# Patient Record
Sex: Female | Born: 1950 | Race: White | Hispanic: No | Marital: Married | Smoking: Former smoker
Health system: Southern US, Community
[De-identification: ages and names within clinical notes are randomized; demographics above are authoritative.]

## PROBLEM LIST (undated history)

## (undated) DIAGNOSIS — I4892 Unspecified atrial flutter: Secondary | ICD-10-CM

## (undated) DIAGNOSIS — M7542 Impingement syndrome of left shoulder: Secondary | ICD-10-CM

## (undated) DIAGNOSIS — N649 Disorder of breast, unspecified: Secondary | ICD-10-CM

## (undated) DIAGNOSIS — F32A Depression, unspecified: Secondary | ICD-10-CM

## (undated) DIAGNOSIS — R42 Dizziness and giddiness: Secondary | ICD-10-CM

## (undated) DIAGNOSIS — E785 Hyperlipidemia, unspecified: Secondary | ICD-10-CM

## (undated) DIAGNOSIS — K219 Gastro-esophageal reflux disease without esophagitis: Secondary | ICD-10-CM

## (undated) DIAGNOSIS — F329 Major depressive disorder, single episode, unspecified: Secondary | ICD-10-CM

## (undated) DIAGNOSIS — Z923 Personal history of irradiation: Secondary | ICD-10-CM

## (undated) DIAGNOSIS — M1990 Unspecified osteoarthritis, unspecified site: Secondary | ICD-10-CM

## (undated) DIAGNOSIS — K589 Irritable bowel syndrome without diarrhea: Secondary | ICD-10-CM

## (undated) DIAGNOSIS — J40 Bronchitis, not specified as acute or chronic: Secondary | ICD-10-CM

## (undated) DIAGNOSIS — R011 Cardiac murmur, unspecified: Secondary | ICD-10-CM

## (undated) DIAGNOSIS — C50919 Malignant neoplasm of unspecified site of unspecified female breast: Secondary | ICD-10-CM

## (undated) HISTORY — DX: Gastro-esophageal reflux disease without esophagitis: K21.9

## (undated) HISTORY — PX: PROLAPSED UTERINE FIBROID LIGATION: SHX5400

## (undated) HISTORY — PX: CHOLECYSTECTOMY: SHX55

## (undated) HISTORY — PX: BUNIONECTOMY: SHX129

## (undated) HISTORY — DX: Hyperlipidemia, unspecified: E78.5

## (undated) HISTORY — PX: VAGINAL HYSTERECTOMY: SUR661

## (undated) HISTORY — PX: APPENDECTOMY: SHX54

## (undated) HISTORY — DX: Dizziness and giddiness: R42

## (undated) HISTORY — PX: ADENOIDECTOMY: SUR15

## (undated) HISTORY — DX: Depression, unspecified: F32.A

## (undated) HISTORY — PX: MASTECTOMY, PARTIAL: SHX709

## (undated) HISTORY — PX: CARDIAC CATHETERIZATION: SHX172

## (undated) HISTORY — DX: Bronchitis, not specified as acute or chronic: J40

## (undated) HISTORY — PX: TONSILLECTOMY: SUR1361

## (undated) HISTORY — PX: CATARACT EXTRACTION, BILATERAL: SHX1313

## (undated) HISTORY — PX: VESICOVAGINAL FISTULA CLOSURE: SUR270

## (undated) HISTORY — DX: Unspecified atrial flutter: I48.92

## (undated) HISTORY — PX: TUBAL LIGATION: SHX77

## (undated) HISTORY — PX: BREAST BIOPSY: SHX20

## (undated) HISTORY — PX: OTHER SURGICAL HISTORY: SHX169

## (undated) HISTORY — DX: Irritable bowel syndrome, unspecified: K58.9

## (undated) HISTORY — DX: Malignant neoplasm of unspecified site of unspecified female breast: C50.919

## (undated) HISTORY — DX: Major depressive disorder, single episode, unspecified: F32.9

## (undated) HISTORY — PX: TOE SURGERY: SHX1073

## (undated) HISTORY — DX: Disorder of breast, unspecified: N64.9

---

## 2016-01-18 ENCOUNTER — Encounter: Payer: Self-pay | Admitting: Hematology

## 2016-01-29 ENCOUNTER — Encounter: Payer: Self-pay | Admitting: Hematology

## 2016-02-19 ENCOUNTER — Encounter: Payer: Self-pay | Admitting: Hematology

## 2016-11-17 DIAGNOSIS — Z923 Personal history of irradiation: Secondary | ICD-10-CM

## 2016-11-17 HISTORY — PX: BREAST LUMPECTOMY: SHX2

## 2016-11-17 HISTORY — DX: Personal history of irradiation: Z92.3

## 2017-03-17 DIAGNOSIS — C50919 Malignant neoplasm of unspecified site of unspecified female breast: Secondary | ICD-10-CM

## 2017-03-17 HISTORY — DX: Malignant neoplasm of unspecified site of unspecified female breast: C50.919

## 2017-03-22 ENCOUNTER — Encounter: Payer: Self-pay | Admitting: Hematology

## 2017-04-01 ENCOUNTER — Encounter: Payer: Self-pay | Admitting: Hematology

## 2017-04-30 ENCOUNTER — Encounter: Payer: Self-pay | Admitting: Hematology

## 2017-08-18 ENCOUNTER — Encounter (HOSPITAL_COMMUNITY): Payer: Self-pay

## 2017-08-18 ENCOUNTER — Encounter (HOSPITAL_COMMUNITY): Payer: Medicare Other

## 2017-08-18 ENCOUNTER — Encounter (HOSPITAL_COMMUNITY): Payer: Medicare Other | Attending: Oncology | Admitting: Oncology

## 2017-08-18 VITALS — BP 147/65 | HR 72 | Resp 16 | Ht 64.0 in | Wt 209.4 lb

## 2017-08-18 DIAGNOSIS — C50911 Malignant neoplasm of unspecified site of right female breast: Secondary | ICD-10-CM | POA: Insufficient documentation

## 2017-08-18 DIAGNOSIS — Z170 Estrogen receptor positive status [ER+]: Secondary | ICD-10-CM | POA: Diagnosis not present

## 2017-08-18 DIAGNOSIS — Z90710 Acquired absence of both cervix and uterus: Secondary | ICD-10-CM | POA: Diagnosis not present

## 2017-08-18 DIAGNOSIS — F329 Major depressive disorder, single episode, unspecified: Secondary | ICD-10-CM | POA: Diagnosis not present

## 2017-08-18 DIAGNOSIS — K582 Mixed irritable bowel syndrome: Secondary | ICD-10-CM | POA: Diagnosis not present

## 2017-08-18 DIAGNOSIS — F419 Anxiety disorder, unspecified: Secondary | ICD-10-CM | POA: Insufficient documentation

## 2017-08-18 DIAGNOSIS — Z9049 Acquired absence of other specified parts of digestive tract: Secondary | ICD-10-CM | POA: Insufficient documentation

## 2017-08-18 DIAGNOSIS — C50919 Malignant neoplasm of unspecified site of unspecified female breast: Secondary | ICD-10-CM | POA: Insufficient documentation

## 2017-08-18 DIAGNOSIS — E785 Hyperlipidemia, unspecified: Secondary | ICD-10-CM | POA: Diagnosis not present

## 2017-08-18 DIAGNOSIS — Z79811 Long term (current) use of aromatase inhibitors: Secondary | ICD-10-CM | POA: Diagnosis not present

## 2017-08-18 LAB — COMPREHENSIVE METABOLIC PANEL
ALT: 14 U/L (ref 14–54)
AST: 16 U/L (ref 15–41)
Albumin: 3.8 g/dL (ref 3.5–5.0)
Alkaline Phosphatase: 87 U/L (ref 38–126)
Anion gap: 8 (ref 5–15)
BILIRUBIN TOTAL: 0.4 mg/dL (ref 0.3–1.2)
BUN: 16 mg/dL (ref 6–20)
CHLORIDE: 104 mmol/L (ref 101–111)
CO2: 25 mmol/L (ref 22–32)
CREATININE: 0.91 mg/dL (ref 0.44–1.00)
Calcium: 9 mg/dL (ref 8.9–10.3)
Glucose, Bld: 103 mg/dL — ABNORMAL HIGH (ref 65–99)
POTASSIUM: 4 mmol/L (ref 3.5–5.1)
Sodium: 137 mmol/L (ref 135–145)
TOTAL PROTEIN: 7.2 g/dL (ref 6.5–8.1)

## 2017-08-18 LAB — CBC WITH DIFFERENTIAL/PLATELET
BASOS ABS: 0 10*3/uL (ref 0.0–0.1)
Basophils Relative: 1 %
EOS PCT: 4 %
Eosinophils Absolute: 0.2 10*3/uL (ref 0.0–0.7)
HEMATOCRIT: 40.4 % (ref 36.0–46.0)
Hemoglobin: 13.4 g/dL (ref 12.0–15.0)
LYMPHS ABS: 1.1 10*3/uL (ref 0.7–4.0)
LYMPHS PCT: 19 %
MCH: 30.9 pg (ref 26.0–34.0)
MCHC: 33.2 g/dL (ref 30.0–36.0)
MCV: 93.1 fL (ref 78.0–100.0)
MONO ABS: 0.6 10*3/uL (ref 0.1–1.0)
MONOS PCT: 10 %
NEUTROS ABS: 3.9 10*3/uL (ref 1.7–7.7)
Neutrophils Relative %: 66 %
PLATELETS: 219 10*3/uL (ref 150–400)
RBC: 4.34 MIL/uL (ref 3.87–5.11)
RDW: 13.3 % (ref 11.5–15.5)
WBC: 5.9 10*3/uL (ref 4.0–10.5)

## 2017-08-18 NOTE — Progress Notes (Signed)
[facility] Initial Visit:  Patient Care Team: Celene Squibb, MD as PCP - General (Internal Medicine)  CHIEF COMPLAINTS/PURPOSE OF CONSULTATION:  Stage 1A Right breast cancer  HISTORY OF PRESENTING ILLNESS: Kathy Evans 66 y.o. 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 Patient was previously treated up at [facility] in [place]. She has transferred her care here since she's moved down here.  Patient initially had an abnormal mammogram on 03/18/2017 which demonstrated persistent ill-defined mass in the 3:00 position of the right breast.   MRI of the bilateral breasts performed in June 2018 demonstrated a small stellate enhancing nodule in the 3:00 position of the right breast. The left breast showed complex clusters of cysts at the 2:00 position.   Patient underwent a right partial mastectomy with sentinel lymph node biopsy on 04/30/2017 with path report demonstrating invasive ductal carcinoma grade 2, 5 mm in maximum dimension with some DCIS, 0 out of 2 sentinel lymph nodes were both negative for malignancy, pT1a N0 (sn). Hormone profile ER 96%, PR 66%, HER-2 not amplified with Ki-67 21%.   Patient then received adjuvant radiation with 42.56 gy +10 gy tumor bed boost all in 20 fractions to right whole breast from 06/11/17-07/08/17.   Patient was then started on adjuvant endocrine therapy with Arimidex on 06/03/17.   Patient had genetic testing done and had a negative genetic test.  She stated that every thing has gone well with her treatment so far except that she was not told that her breast would shrink after radiation. She stated she had significant shrinkage. She has been tolerating Arimidex well except for hot flashes. She also has joint pains in her thighs and hips which have been chronic even prior to her taking Arimidex. She has occasional fatigue. She has history of IBS with  alternating diarrhea with constipation. Her appetite is good and she denies any recent weight loss. She denies any chest pain, shortness breath, abdominal pain, recent infections, focal weakness.  Review of Systems - Oncology ROS as per HPI otherwise 12 point ROS is negative.  MEDICAL HISTORY: No past medical history on file. Hyperlipidemia Anxiety/depression Arthritis Right breast invasive ductal carcinoma IBS  SURGICAL HISTORY: No past surgical history on file. Bladder surgery  tonsillectomy  hysterectomy  tubal ligation  cholecystectomy  uterine prolapse repair  appendectomy  cataract surgery bilaterally  right breast partial mastectomy with sentinel lymph node biopsy  SOCIAL HISTORY: Social History   Social History  . Marital status: Married    Spouse name: N/A  . Number of children: N/A  . Years of education: N/A   Occupational History  . Not on file.   Social History Main Topics  . Smoking status: Not on file  . Smokeless tobacco: Not on file  . Alcohol use Not on file  . Drug use: Unknown  . Sexual activity: Not on file   Other Topics Concern  . Not on file   Social History Narrative  . No narrative on file    FAMILY HISTORY No family history on file. Niece with breast cancer Sister with endometrial cancer Sister with breast cancer  ALLERGIES:  has no allergies on file.  MEDICATIONS:  No current outpatient prescriptions on file.   No current facility-administered medications for this visit.     PHYSICAL EXAMINATION:  ECOG PERFORMANCE STATUS: 0 - Asymptomatic   Vitals:   08/18/17 1308  BP: (!) 147/65  Pulse: 72  Resp: 16  SpO2: 97%    Filed Weights   08/18/17 1308  Weight: 209 lb 6.4 oz (95 kg)     Physical Exam Constitutional: Well-developed, well-nourished, and in no distress.   HENT:  Head: Normocephalic and atraumatic.  Mouth/Throat: No oropharyngeal exudate. Mucosa moist. Eyes: Pupils are equal, round, and reactive to  light. Conjunctivae are normal. No scleral icterus.  Neck: Normal range of motion. Neck supple. No JVD present.  Cardiovascular: Normal rate, regular rhythm and normal heart sounds.  Exam reveals no gallop and no friction rub.   No murmur heard. Pulmonary/Chest: Effort normal and breath sounds normal. No respiratory distress. No wheezes.No rales.  Abdominal: Soft. Bowel sounds are normal. No distension. There is no tenderness. There is no guarding.  Musculoskeletal: No edema or tenderness.  Lymphadenopathy:    No cervical or supraclavicular adenopathy.  Neurological: Alert and oriented to person, place, and time. No cranial nerve deficit.  Skin: Skin is warm and dry. No rash noted. No erythema. No pallor.  Psychiatric: Affect and judgment normal.  Breast: Left breast without skin changes, nipple discharge, masses, or L axillary lymphadenopathy. Right breast smaller than left breast, post radiation skin darkening, well healed 3 oclock surgical scar, no masses palpated, no nipple discharge, no R axillary lymphadenopathy.  LABORATORY DATA: I have personally reviewed the data as listed: No labs available.  RADIOGRAPHIC STUDIES: I have personally reviewed the radiological images as listed and agree with the findings in the report  No results found.  ASSESSMENT/PLAN Right breast invasive ductal carcinoma stage I A s/p  right partial mastectomy with sentinel lymph node biopsy on 04/30/2017 followed by adjuvant radiation to right whole breast from 06/11/17-07/08/17. Patient was started on adjuvant endocrine therapy with Arimidex on 06/03/17.  PLAN: -Reviewed all her medical records from her previous physicians.  -Reviewed potential side effects from arimidex in detail with the patient including hot flashes, arthralgias, decrease in bone density.  -Will get baseline DEXA scan. -Continue arimidex for at least 5 years. Recommended for patient to start a calcium-vitamin D supplement for bone health  while on AI. -Bilateral diagnostic mammogram ordered for next month, which will be her 6 month mammogram for her right breast cancer and annual for her left breast. -CBC, CMP today. -RTC in 6 months for follow up with labs with CBC, CMP.  Orders Placed This Encounter  Procedures  . DG Bone Density    Standing Status:   Future    Standing Expiration Date:   08/18/2018    Order Specific Question:   Reason for Exam (SYMPTOM  OR DIAGNOSIS REQUIRED)    Answer:   osteopenia    Order Specific Question:   Preferred imaging location?    Answer:   [facility]    Standing Status:   Future    Standing Expiration Date:   08/18/2018    Order Specific Question:   Reason for Exam (SYMPTOM  OR DIAGNOSIS REQUIRED)    Answer:   recent diagnosis of right breast CA in May 2018    Order Specific Question:   Preferred imaging location?    Answer:   [facility]  . CBC with Differential    Standing Status:   Standing    Number of Occurrences:   2    Standing Expiration Date:   02/17/2019  . Comprehensive metabolic panel    Standing Status:   Standing    Number  of Occurrences:   2    Standing Expiration Date:   08/18/2018   All questions were answered. The patient knows to call the clinic with any problems, questions or concerns.  This note was electronically signed.    Twana First, MD  08/18/2017 1:08 PM

## 2017-08-21 ENCOUNTER — Ambulatory Visit (HOSPITAL_COMMUNITY)
Admission: RE | Admit: 2017-08-21 | Discharge: 2017-08-21 | Disposition: A | Discharge status: Home / Self Care | Payer: Medicare Other | Source: Ambulatory Visit | Attending: Oncology | Admitting: Oncology

## 2017-08-21 DIAGNOSIS — Z780 Asymptomatic menopausal state: Secondary | ICD-10-CM | POA: Diagnosis not present

## 2017-08-21 DIAGNOSIS — M85852 Other specified disorders of bone density and structure, left thigh: Secondary | ICD-10-CM | POA: Diagnosis not present

## 2017-08-21 DIAGNOSIS — C50911 Malignant neoplasm of unspecified site of right female breast: Secondary | ICD-10-CM | POA: Insufficient documentation

## 2017-08-21 DIAGNOSIS — M8588 Other specified disorders of bone density and structure, other site: Secondary | ICD-10-CM | POA: Diagnosis not present

## 2017-08-21 DIAGNOSIS — Z170 Estrogen receptor positive status [ER+]: Secondary | ICD-10-CM | POA: Insufficient documentation

## 2017-08-27 DIAGNOSIS — Z0000 Encounter for general adult medical examination without abnormal findings: Secondary | ICD-10-CM | POA: Diagnosis not present

## 2017-08-27 DIAGNOSIS — C50911 Malignant neoplasm of unspecified site of right female breast: Secondary | ICD-10-CM | POA: Diagnosis not present

## 2017-08-27 DIAGNOSIS — K219 Gastro-esophageal reflux disease without esophagitis: Secondary | ICD-10-CM | POA: Diagnosis not present

## 2017-08-27 DIAGNOSIS — Z23 Encounter for immunization: Secondary | ICD-10-CM | POA: Diagnosis not present

## 2017-09-01 DIAGNOSIS — Z0000 Encounter for general adult medical examination without abnormal findings: Secondary | ICD-10-CM | POA: Diagnosis not present

## 2017-09-03 DIAGNOSIS — C50911 Malignant neoplasm of unspecified site of right female breast: Secondary | ICD-10-CM | POA: Diagnosis not present

## 2017-09-09 ENCOUNTER — Other Ambulatory Visit: Payer: Self-pay | Admitting: Oncology

## 2017-09-09 DIAGNOSIS — Z9889 Other specified postprocedural states: Secondary | ICD-10-CM

## 2017-09-17 ENCOUNTER — Encounter: Payer: Self-pay | Admitting: Adult Health

## 2017-09-17 ENCOUNTER — Ambulatory Visit (INDEPENDENT_AMBULATORY_CARE_PROVIDER_SITE_OTHER): Payer: Medicare Other | Admitting: Adult Health

## 2017-09-17 VITALS — BP 148/70 | HR 79 | Ht 63.25 in | Wt 206.0 lb

## 2017-09-17 DIAGNOSIS — B9689 Other specified bacterial agents as the cause of diseases classified elsewhere: Secondary | ICD-10-CM

## 2017-09-17 DIAGNOSIS — N898 Other specified noninflammatory disorders of vagina: Secondary | ICD-10-CM | POA: Diagnosis not present

## 2017-09-17 DIAGNOSIS — R10814 Left lower quadrant abdominal tenderness: Secondary | ICD-10-CM | POA: Diagnosis not present

## 2017-09-17 DIAGNOSIS — N760 Acute vaginitis: Secondary | ICD-10-CM | POA: Diagnosis not present

## 2017-09-17 DIAGNOSIS — N952 Postmenopausal atrophic vaginitis: Secondary | ICD-10-CM | POA: Diagnosis not present

## 2017-09-17 DIAGNOSIS — Z853 Personal history of malignant neoplasm of breast: Secondary | ICD-10-CM | POA: Diagnosis not present

## 2017-09-17 LAB — POCT WET PREP (WET MOUNT)
CLUE CELLS WET PREP WHIFF POC: NEGATIVE
WBC, Wet Prep HPF POC: POSITIVE

## 2017-09-17 MED ORDER — METRONIDAZOLE 500 MG PO TABS: 500.0000 mg | ORAL_TABLET | Freq: Two times a day (BID) | ORAL | 0 refills | Status: DC

## 2017-09-17 NOTE — Patient Instructions (Signed)
Atrophic Vaginitis Atrophic vaginitis is a condition in which the tissues that line the vagina become dry and thin. This condition is most common in women who have stopped having regular menstrual periods (menopause). This usually starts when a woman is 45-68 years old. Estrogen helps to keep the vagina moist. It stimulates the vagina to produce a clear fluid that lubricates the vagina for sexual intercourse. This fluid also protects the vagina from infection. Lack of estrogen can cause the lining of the vagina to get thinner and dryer. The vagina may also shrink in size. It may become less elastic. Atrophic vaginitis tends to get worse over time as a woman's estrogen level drops. What are the causes? This condition is caused by the normal drop in estrogen that happens around the time of menopause. What increases the risk? Certain conditions or situations may lower a woman's estrogen level, which increases her risk of atrophic vaginitis. These include:  Taking medicine that blocks estrogen.  Having ovaries removed surgically.  Being treated for cancer with X-ray treatment (radiation) or medicines (chemotherapy).  Exercising very hard and often.  Having an eating disorder (anorexia).  Giving birth or breastfeeding.  Being over the age of 50.  Smoking.  What are the signs or symptoms? Symptoms of this condition include:  Pain, soreness, or bleeding during sexual intercourse (dyspareunia).  Vaginal burning, irritation, or itching.  Pain or bleeding during a vaginal examination using a speculum (pelvic exam).  Loss of interest in sexual activity.  Having burning pain when passing urine.  Vaginal discharge that is brown or yellow.  In some cases, there are no symptoms. How is this diagnosed? This condition is diagnosed with a medical history and physical exam. This will include a pelvic exam that checks whether the inside of your vagina appears pale, thin, or dry. Rarely, you may  also have other tests, including:  A urine test.  A test that checks the acid balance in your vaginal fluid (acid balance test).  How is this treated? Treatment for this condition may depend on the severity of your symptoms. Treatment may include:  Using an over-the-counter vaginal lubricant before you have sexual intercourse.  Using a long-acting vaginal moisturizer.  Using low-dose vaginal estrogen for moderate to severe symptoms that do not respond to other treatments. Options include creams, tablets, and inserts (vaginal rings). Before using vaginal estrogen, tell your health care provider if you have a history of: ? Breast cancer. ? Endometrial cancer. ? Blood clots.  Taking medicines. You may be able to take a daily pill for dyspareunia. Discuss all of the risks of this medicine with your health care provider. It is usually not recommended for women who have a family history or personal history of breast cancer.  If your symptoms are very mild and you are not sexually active, you may not need treatment. Follow these instructions at home:  Take medicines only as directed by your health care provider. Do not use herbal or alternative medicines unless your health care provider says that you can.  Use over-the-counter creams, lubricants, or moisturizers for dryness only as directed by your health care provider.  If your atrophic vaginitis is caused by menopause, discuss all of your menopausal symptoms and treatment options with your health care provider.  Do not douche.  Do not use products that can make your vagina dry. These include: ? Scented feminine sprays. ? Scented tampons. ? Scented soaps.  If it hurts to have sex, talk with your sexual   partner. Contact a health care provider if:  Your discharge looks different than normal.  Your vagina has an unusual smell.  You have new symptoms.  Your symptoms do not improve with treatment.  Your symptoms get worse. This  information is not intended to replace advice given to you by your health care provider. Make sure you discuss any questions you have with your health care provider. Document Released: 03/20/2015 Document Revised: 04/10/2016 Document Reviewed: 10/25/2014 Elsevier Interactive Patient Education  2018 [employer]. Bacterial Vaginosis Bacterial vaginosis is a vaginal infection that occurs when the normal balance of bacteria in the vagina is disrupted. It results from an overgrowth of certain bacteria. This is the most common vaginal infection among women ages 18-44. Because bacterial vaginosis increases your risk for STIs (sexually transmitted infections), getting treated can help reduce your risk for chlamydia, gonorrhea, herpes, and HIV (human immunodeficiency virus). Treatment is also important for preventing complications in pregnant women, because this condition can cause an early (premature) delivery. What are the causes? This condition is caused by an increase in harmful bacteria that are normally present in small amounts in the vagina. However, the reason that the condition develops is not fully understood. What increases the risk? The following factors may make you more likely to develop this condition:  Having a new sexual partner or multiple sexual partners.  Having unprotected sex.  Douching.  Having an intrauterine device (IUD).  Smoking.  Drug and alcohol abuse.  Taking certain antibiotic medicines.  Being pregnant.  You cannot get bacterial vaginosis from toilet seats, bedding, swimming pools, or contact with objects around you. What are the signs or symptoms? Symptoms of this condition include:  Grey or white vaginal discharge. The discharge can also be watery or foamy.  A fish-like odor with discharge, especially after sexual intercourse or during menstruation.  Itching in and around the vagina.  Burning or pain with urination.  Some women with bacterial  vaginosis have no signs or symptoms. How is this diagnosed? This condition is diagnosed based on:  Your medical history.  A physical exam of the vagina.  Testing a sample of vaginal fluid under a microscope to look for a large amount of bad bacteria or abnormal cells. Your health care provider may use a cotton swab or a small wooden spatula to collect the sample.  How is this treated? This condition is treated with antibiotics. These may be given as a pill, a vaginal cream, or a medicine that is put into the vagina (suppository). If the condition comes back after treatment, a second round of antibiotics may be needed. Follow these instructions at home: Medicines  Take over-the-counter and prescription medicines only as told by your health care provider.  Take or use your antibiotic as told by your health care provider. Do not stop taking or using the antibiotic even if you start to feel better. General instructions  If you have a female sexual partner, tell her that you have a vaginal infection. She should see her health care provider and be treated if she has symptoms. If you have a female sexual partner, he does not need treatment.  During treatment: ? Avoid sexual activity until you finish treatment. ? Do not douche. ? Avoid alcohol as directed by your health care provider. ? Avoid breastfeeding as directed by your health care provider.  Drink enough water and fluids to keep your urine clear or pale yellow.  Keep the area around your vagina and rectum clean. ?  Wash the area daily with warm water. ? Wipe yourself from front to back after using the toilet.  Keep all follow-up visits as told by your health care provider. This is important. How is this prevented?  Do not douche.  Wash the outside of your vagina with warm water only.  Use protection when having sex. This includes latex condoms and dental dams.  Limit how many sexual partners you have. To help prevent bacterial  vaginosis, it is best to have sex with just one partner (monogamous).  Make sure you and your sexual partner are tested for STIs.  Wear cotton or cotton-lined underwear.  Avoid wearing tight pants and pantyhose, especially during summer.  Limit the amount of alcohol that you drink.  Do not use any products that contain nicotine or tobacco, such as cigarettes and e-cigarettes. If you need help quitting, ask your health care provider.  Do not use illegal drugs. Where to find more information:  Centers for Disease Control and Prevention: AppraiserFraud.fi  American Sexual Health Association (ASHA): www.ashastd.org  U.S. Department of Health and Financial controller, Office on Women's Health: DustingSprays.pl or SecuritiesCard.it Contact a health care provider if:  Your symptoms do not improve, even after treatment.  You have more discharge or pain when urinating.  You have a fever.  You have pain in your abdomen.  You have pain during sex.  You have vaginal bleeding between periods. Summary  Bacterial vaginosis is a vaginal infection that occurs when the normal balance of bacteria in the vagina is disrupted.  Because bacterial vaginosis increases your risk for STIs (sexually transmitted infections), getting treated can help reduce your risk for chlamydia, gonorrhea, herpes, and HIV (human immunodeficiency virus). Treatment is also important for preventing complications in pregnant women, because the condition can cause an early (premature) delivery.  This condition is treated with antibiotic medicines. These may be given as a pill, a vaginal cream, or a medicine that is put into the vagina (suppository). This information is not intended to replace advice given to you by your health care provider. Make sure you discuss any questions you have with your health care provider. Document Released: 11/03/2005 Document Revised: 07/19/2016 Document  Reviewed: 07/19/2016 Elsevier Interactive Patient Education  2017 [employer].

## 2017-09-17 NOTE — Progress Notes (Signed)
Subjective:     Patient ID: Kathy Evans, female   DOB: 02-03-1951, 66 y.o.   MRN: [id number]  HPI Kathy Evans is a 66 year old white female, married, sp hysterectomy(for prolapse, has 1 ovary) in for vaginal discharge for about a month.It is sticky but no odor, or itching or burning.She is not sexually active, husband has ED.She had breast cancer and is on Arimidex.  PCP is Dr Nevada Crane.  Review of Systems Sticky, Vaginal discharge, for about a month Denies any burning or itching or odor  Reviewed past medical,surgical, social and family history. Reviewed medications and allergies.     Objective:   Physical Exam BP (!) 148/70 (BP Location: Left Arm, Patient Position: Sitting, Cuff Size: Large)   Pulse 79   Ht 5' 3.25" (1.607 m)   Wt 206 lb (93.4 kg)   BMI 36.20 kg/m    PHQ 2 score 0. Skin warm and dry.Pelvic: external genitalia is normal in appearance no lesions, vagina: tannish discharge without odor, vaginal tissue is red, thin and tender,urethra has no lesions or masses noted, cervix and uterus are absent, adnexa: no masses, + tenderness noted, L>R. Bladder is non tender and no masses felt. Wet prep: + for clue cells and +++WBCs.  Assessment:     1. BV (bacterial vaginosis)   2. Vaginal discharge   3. Vaginal atrophy   4. Left lower quadrant abdominal tenderness without rebound tenderness   5. History of breast cancer       Plan:     Rx flagyl 500 mg 1 bid x 7 days, no alcohol, review handout on BV    Review handout on vaginal atrophy Return 11/9 for GYN [place] and see me

## 2017-09-22 ENCOUNTER — Inpatient Hospital Stay (HOSPITAL_COMMUNITY): Admission: RE | Admit: 2017-09-22 | Payer: Medicare Other | Source: Ambulatory Visit

## 2017-09-25 ENCOUNTER — Encounter: Payer: Self-pay | Admitting: Adult Health

## 2017-09-25 ENCOUNTER — Ambulatory Visit: Payer: Medicare Other | Admitting: Adult Health

## 2017-09-25 ENCOUNTER — Ambulatory Visit (INDEPENDENT_AMBULATORY_CARE_PROVIDER_SITE_OTHER): Payer: Medicare Other

## 2017-09-25 VITALS — BP 136/70 | HR 95 | Ht 62.0 in | Wt 206.4 lb

## 2017-09-25 DIAGNOSIS — R10814 Left lower quadrant abdominal tenderness: Secondary | ICD-10-CM

## 2017-09-25 DIAGNOSIS — K582 Mixed irritable bowel syndrome: Secondary | ICD-10-CM | POA: Diagnosis not present

## 2017-09-25 NOTE — Progress Notes (Signed)
PELVIC US TA/TV: normal vaginal cuff,right adnexa wnl, left ovary wnl (limited view),unable to push on left ovary because of left adnexal pain,no free fluid

## 2017-09-25 NOTE — Progress Notes (Signed)
Subjective:     Patient ID: Idil Maslanka, female   DOB: 1951/09/21, 66 y.o.   MRN: [id number]  HPI Marysol is a 66 year old white female in for [place] for LLQ pain.   Review of Systems Vaginal discharge much better Had flare with IBS Monday, has constipation and diarrhea, more diarrhea lately     Reviewed past medical,surgical, social and family history. Reviewed medications and allergies.  Objective:   Physical Exam BP 136/70 (BP Location: Left Arm, Patient Position: Sitting, Cuff Size: Normal)   Pulse 95   Ht 5\' 2"  (1.575 m)   Wt 206 lb 6.4 oz (93.6 kg)   BMI 37.75 kg/m    PHQ  9score 0. Reviewed [place] with pt and husband, had normal vaginal cuff, no right ovary seen, left ovary poorly sen but appeared normal and she had pain LLQ with [place], and US showed lots of bowel.She is aware could have adhesions, too. Will refer to GI.  Assessment:     1. Irritable bowel syndrome with both constipation and diarrhea   2. Left lower quadrant abdominal tenderness without rebound tenderness       Plan:    Referred to [facility], Roseanne Kaufman, NP  F/U prn IF increased pain go to ER

## 2017-09-29 ENCOUNTER — Encounter (HOSPITAL_COMMUNITY): Payer: Self-pay

## 2017-09-29 ENCOUNTER — Ambulatory Visit (HOSPITAL_COMMUNITY)
Admission: RE | Admit: 2017-09-29 | Discharge: 2017-09-29 | Disposition: A | Discharge status: Home / Self Care | Payer: Medicare Other | Source: Ambulatory Visit | Attending: Oncology | Admitting: Oncology

## 2017-09-29 DIAGNOSIS — Z170 Estrogen receptor positive status [ER+]: Secondary | ICD-10-CM | POA: Insufficient documentation

## 2017-09-29 DIAGNOSIS — C50911 Malignant neoplasm of unspecified site of right female breast: Secondary | ICD-10-CM | POA: Diagnosis not present

## 2017-09-29 DIAGNOSIS — R928 Other abnormal and inconclusive findings on diagnostic imaging of breast: Secondary | ICD-10-CM | POA: Diagnosis not present

## 2017-09-29 HISTORY — DX: Personal history of irradiation: Z92.3

## 2017-10-01 ENCOUNTER — Telehealth: Payer: Self-pay | Admitting: *Deleted

## 2017-10-01 NOTE — Telephone Encounter (Signed)
Yes it is ok use use Luvena, has slight discharge

## 2017-10-01 NOTE — Telephone Encounter (Signed)
Left message that I called.

## 2017-10-05 ENCOUNTER — Encounter: Payer: Self-pay | Admitting: Gastroenterology

## 2017-10-21 DIAGNOSIS — J019 Acute sinusitis, unspecified: Secondary | ICD-10-CM | POA: Diagnosis not present

## 2017-11-13 DIAGNOSIS — Z23 Encounter for immunization: Secondary | ICD-10-CM | POA: Diagnosis not present

## 2017-11-26 ENCOUNTER — Ambulatory Visit: Payer: Medicare Other | Admitting: Gastroenterology

## 2017-11-26 ENCOUNTER — Encounter: Payer: Self-pay | Admitting: Gastroenterology

## 2017-11-26 VITALS — BP 172/88 | HR 71 | Temp 97.0°F | Ht 62.0 in | Wt 207.6 lb

## 2017-11-26 DIAGNOSIS — R195 Other fecal abnormalities: Secondary | ICD-10-CM | POA: Insufficient documentation

## 2017-11-26 DIAGNOSIS — K582 Mixed irritable bowel syndrome: Secondary | ICD-10-CM | POA: Diagnosis not present

## 2017-11-26 DIAGNOSIS — I10 Essential (primary) hypertension: Secondary | ICD-10-CM | POA: Diagnosis not present

## 2017-11-26 NOTE — Progress Notes (Addendum)
REVIEWED-TRIAGE FOR TCS IN 2019 W/ MAC.  Primary Care Physician:  Celene Squibb, MD  Referring Physician: Derrek Monaco, NP Primary Gastroenterologist:  Dr. Oneida Alar   Chief Complaint  Patient presents with  . Irritable Bowel Syndrome    constipation/diarrhea  . Abdominal Pain    tenderness LLQ x Aug 2018    HPI:   Kathy Evans is a 67 y.o. female presenting today at the request of Derrek Monaco, NP, secondary to IBS with alternating constipation and diarrhea. She moved here from [place] in August. Diagnosed with breast cancer May 2018, underwent right partial mastectomy followed by adjuvant radiation.   Last colonoscopy/EGD about 2-3 years ago in [place]. (Phone #: [phone], address is [address] [place], [place], [place], [address]). Dr. Nemiah Commander.    No abdominal pain unless pressing on stomach. If having a "flare" will have abdominal cramping. Had been told by previous GI to eat more fiber. Stool got so large she couldn't pass it. Gets uncomfortable if doesn't have a BM every day. States she has hemorrhoids. Will have significant constipation then will have multiple episodes of diarrhea. Some have lasted 5 days. Used kaopectate. Was placed on Linzess and told only take if starting to get really constipated. Has tried stool softeners. Diarrhea better now. Not taking Linzess 72 mcg daily because it would cause loose stool every day. Occasional bright red blood with wiping. No melena. Sometimes stool looks gritty. States her very first colonoscopy had polyps. No FH of colon cancer or colon polyps but states cancer runs in her family. States she had genetic testing for Lynch syndrome, which was negative.   States she has a hiatal hernia. Taking Omeprazole daily. If she doesn't take, will have symptomatic reflux. Fiber blocks her up. States she did a stool test through insurance, and this was positive.   Past Medical History:  Diagnosis Date  . Acid reflux   . Breast cancer  ([place]) 03/2017  . Breast disorder    cancer  . Bronchitis   . Depression   . GERD (gastroesophageal reflux disease)   . Hyperlipidemia   . IBS (irritable bowel syndrome)   . Personal history of radiation therapy 2018    Past Surgical History:  Procedure Laterality Date  . ADENOIDECTOMY    . BREAST BIOPSY Left    benign  . BREAST LUMPECTOMY Right 2018  . BUNIONECTOMY    . CHOLECYSTECTOMY    . colon obstruction     lysis of adhesions (no colon or small bowel resections)   . full mastectomy Right   . PROLAPSED UTERINE FIBROID LIGATION    . TONSILLECTOMY    . TUBAL LIGATION    . VAGINAL HYSTERECTOMY      Current Outpatient Medications  Medication Sig Dispense Refill  . ALPRAZolam (XANAX) 0.5 MG tablet Take 0.5 mg by mouth at bedtime as needed for anxiety.    Marland Kitchen anastrozole (ARIMIDEX) 1 MG tablet Take 1 mg by mouth daily.    Marland Kitchen buPROPion (WELLBUTRIN SR) 150 MG 12 hr tablet Take 300 mg by mouth daily.     . Calcium Carbonate-Vit D-Min (CALCIUM 600+D3 PLUS MINERALS PO) Take by mouth daily.    . fluticasone (FLONASE) 50 MCG/ACT nasal spray Place into both nostrils as needed for allergies or rhinitis.    Marland Kitchen KRILL OIL PO Take by mouth daily.    Marland Kitchen levocetirizine (XYZAL) 5 MG tablet Take 5 mg by mouth every evening.    . linaclotide (LINZESS) 72 MCG capsule Take  72 mcg by mouth as needed.    Marland Kitchen omeprazole (PRILOSEC) 40 MG capsule Take 40 mg by mouth daily.    Marland Kitchen UNABLE TO FIND daily. prosynbiotic     No current facility-administered medications for this visit.     Allergies as of 11/26/2017  . (No Known Allergies)    Family History  Problem Relation Age of Onset  . Diabetes Maternal Grandmother   . Alzheimer's disease Maternal Grandmother   . Heart disease Father   . Cancer Mother        uterine  . Diabetes Mother   . Heart disease Brother   . Diabetes Brother   . Cancer Sister        lung  . Diabetes Sister   . Heart disease Sister        had stent placed  . Heart  disease Brother   . Cancer Sister        uterine  . Diabetes Sister   . High blood pressure Sister   . Colon cancer Neg Hx     Social History   Socioeconomic History  . Marital status: Married    Spouse name: Not on file  . Number of children: Not on file  . Years of education: Not on file  . Highest education level: Not on file  Social Needs  . Financial resource strain: Not on file  . Food insecurity - worry: Not on file  . Food insecurity - inability: Not on file  . Transportation needs - medical: Not on file  . Transportation needs - non-medical: Not on file  Occupational History  . Occupation: retired  Tobacco Use  . Smoking status: Former Smoker    Years: 35.00    Types: Cigarettes    Last attempt to quit: 2000    Years since quitting: 19.0  . Smokeless tobacco: Never Used  Substance and Sexual Activity  . Alcohol use: Yes    Comment: very seldom  . Drug use: No  . Sexual activity: Not Currently    Birth control/protection: Surgical    Comment: hyst  Other Topics Concern  . Not on file  Social History Narrative  . Not on file    Review of Systems: Gen: Denies any fever, chills, fatigue, weight loss, lack of appetite.  CV: Denies chest pain, heart palpitations, peripheral edema, syncope.  Resp: Denies shortness of breath at rest or with exertion. Denies wheezing or cough.  GI: see HPI  GU : Denies urinary burning, urinary frequency, urinary hesitancy MS: Denies joint pain, muscle weakness, cramps, or limitation of movement.  Derm: Denies rash, itching, dry skin Psych: Denies depression, anxiety, memory loss, and confusion Heme: see HPI   Physical Exam: BP (!) 172/88 (BP Location: Left Arm, Cuff Size: Large)   Pulse 71   Temp (!) 97 F (36.1 C) (Oral)   Ht _0  (1.575 m)   Wt 207 lb 9.6 oz (94.2 kg)   BMI 37.97 kg/m  General:   Alert and oriented. Pleasant and cooperative. Well-nourished and well-developed.  Head:  Normocephalic and  atraumatic. Eyes:  Without icterus, sclera clear and conjunctiva pink.  Ears:  Normal auditory acuity. Nose:  No deformity, discharge,  or lesions. Mouth:  No deformity or lesions, oral mucosa pink.  Lungs:  Clear to auscultation bilaterally. No wheezes, rales, or rhonchi. No distress.  Heart:  S1, S2 present without murmurs appreciated.  Abdomen:  +BS, soft, non-tender and non-distended. No HSM noted. No guarding or  rebound. No masses appreciated.  Rectal:  Deferred  Msk:  Symmetrical without gross deformities. Normal posture. Extremities:  Without edema. Neurologic:  Alert and  oriented x4 Psych:  Alert and cooperative. Normal mood and affect.

## 2017-11-26 NOTE — Patient Instructions (Signed)
I would like for you to try Amitiza one gelcap with breakfast, increasing to twice a day (with food to avoid nausea) if you tolerate the once daily dosing. I think it would be good to take this every day, whether it is just once or twice a day. It's indicated for twice a day, but we can use the novel approach of once daily if needed. There is a higher dose as well, if we find that would be better.  We will get the reports and call you. I recommend a colonoscopy, but we will review when the last was done.  Will see you in 3 months!  It was a pleasure to see you today. I strive to create trusting relationships with patients to provide genuine, compassionate, and quality care. I value your feedback. If you receive a survey regarding your visit,  I greatly appreciate you the taking time to fill this out.   Annitta Needs, PhD, ANP-BC [facility]

## 2017-12-01 NOTE — Assessment & Plan Note (Signed)
67 year old female with IBS-mixed but appears to have a more constipation predominant presentation. Linzess 72 mcg has been too strong historically. Will trial Amitiza 8 mcg once to BID. Samples provided. Return in 3 months.

## 2017-12-01 NOTE — Assessment & Plan Note (Signed)
Discussed need for colonoscopy due to recently heme positive stool. She reports a colonoscopy several years ago in [place], and she desires to have [place] review these reports prior to arranging colonoscopy. Reports requested. Discussed risks/benefits of colonoscopy. Will contact her when we receive these reports and recommend proceeding with colonoscopy with Dr. Oneida Alar. Would likely use Propofol.

## 2017-12-02 DIAGNOSIS — C50011 Malignant neoplasm of nipple and areola, right female breast: Secondary | ICD-10-CM | POA: Diagnosis not present

## 2017-12-03 NOTE — Progress Notes (Signed)
CC'D TO PCP °

## 2017-12-10 NOTE — Progress Notes (Signed)
Received outside records from colonoscopy dated July 2017 in [place]. Reports states: normal ileum, sigmoid diverticulosis, internal hemorrhoids.   She has a personal history of colon polyps. We are coming up on 2 years in July since last colonoscopy. She was recently found to be heme positive. This is likely benign anorectal source, but would recommend update colonoscopy here if she is willing. Please notify patient of recommendations.

## 2017-12-10 NOTE — Progress Notes (Signed)
Tried to call, busy signal.

## 2017-12-14 NOTE — Progress Notes (Signed)
Pt is aware and said she is doing well now. She does not want to do the colonoscopy at this time. Said she might repeat the Cologuard in 2-3 months and see how that looks. She will be in touch when she decides what she wants to do.

## 2017-12-21 ENCOUNTER — Telehealth: Payer: Self-pay

## 2017-12-21 NOTE — Telephone Encounter (Signed)
Gastroenterology Pre-Procedure Review  Request Date: 12/21/2017 Requesting Physician: Roseanne Kaufman, NP  PATIENT REVIEW QUESTIONS: The patient responded to the following health history questions as indicated:    1. Diabetes Melitis: no 2. Joint replacements in the past 12 months: no 3. Major health problems in the past 3 months: Dx with breast cancer in June 2018/ radiation complete in 06/2017 4. Has an artificial valve or MVP: no 5. Has a defibrillator: no 6. Has been advised in past to take antibiotics in advance of a procedure like teeth cleaning: no 7. Family history of colon cancer: no  8. Alcohol Use: no 9. History of sleep apnea: no  10. History of coronary artery or other vascular stents placed within the last 12 months: no 11. History of any prior anesthesia complications: no    MEDICATIONS & ALLERGIES:    Patient reports the following regarding taking any blood thinners:   Plavix? NO Aspirin? no Coumadin? no Brilinta? no Xarelto? no Eliquis? no Pradaxa? no Savaysa? no Effient? no  Patient confirms/reports the following medications:  Current Outpatient Medications  Medication Sig Dispense Refill  . ALPRAZolam (XANAX) 0.5 MG tablet Take 0.5 mg by mouth at bedtime as needed for anxiety.    Marland Kitchen anastrozole (ARIMIDEX) 1 MG tablet Take 1 mg by mouth daily.    Marland Kitchen buPROPion (WELLBUTRIN SR) 150 MG 12 hr tablet Take 150 mg by mouth daily.     . Calcium Carbonate-Vit D-Min (CALCIUM 600+D3 PLUS MINERALS PO) Take by mouth daily.    . diphenhydrAMINE (BENADRYL) 25 MG tablet Take 25 mg by mouth every 6 (six) hours as needed.    . fluticasone (FLONASE) 50 MCG/ACT nasal spray Place into both nostrils as needed for allergies or rhinitis.    Marland Kitchen KRILL OIL PO Take by mouth daily.    Marland Kitchen omeprazole (PRILOSEC) 40 MG capsule Take 40 mg by mouth daily.    Marland Kitchen UNABLE TO FIND daily. prosynbiotic    . levocetirizine (XYZAL) 5 MG tablet Take 5 mg by mouth every evening.    . linaclotide (LINZESS) 72 MCG  capsule Take 72 mcg by mouth as needed.     No current facility-administered medications for this visit.     Patient confirms/reports the following allergies:  No Known Allergies  No orders of the defined types were placed in this encounter.   AUTHORIZATION INFORMATION Primary Insurance:   ID #:  Group #:  Pre-Cert / Auth required:  Pre-Cert / Auth #:   Secondary Insurance:   ID #:   Group #:  Pre-Cert / Auth required: Pre-Cert / Auth #:   SCHEDULE INFORMATION: Procedure has been scheduled as follows:  Date:  01/18/2018                Time:  1:30 pm Location: [facility] Short Stay  This Gastroenterology Pre-Precedure Review Form is being routed to the following provider(s): Barney Drain, MD

## 2017-12-23 DIAGNOSIS — Z961 Presence of intraocular lens: Secondary | ICD-10-CM | POA: Diagnosis not present

## 2017-12-23 DIAGNOSIS — H5212 Myopia, left eye: Secondary | ICD-10-CM | POA: Diagnosis not present

## 2017-12-23 NOTE — Telephone Encounter (Signed)
Doris: she will need to be done with Propofol. I recently saw her in the office. Is she willing to pursue colonoscopy now? See office note addendum. She was not wanting to at that time.

## 2017-12-23 NOTE — Telephone Encounter (Signed)
Vicente Males, she called me to schedule. I was not aware she needed to be done with propofol. Please advise if she needs OV prior to procedure.

## 2017-12-29 NOTE — Telephone Encounter (Signed)
SEE OPV JAN 2019.

## 2017-12-29 NOTE — Telephone Encounter (Signed)
Dr. Oneida Alar: do we need to bring her back in to update H&P? I had recommended a colonoscopy when I saw her, but she wanted to wait.

## 2017-12-30 NOTE — Telephone Encounter (Signed)
Kathy Evans: May triage. Appropriate for Propofol.

## 2017-12-31 NOTE — Telephone Encounter (Signed)
Pt called back and is scheduled for 02/02/2018 at 11:30 AM. With Dr. Oneida Alar.

## 2017-12-31 NOTE — Telephone Encounter (Signed)
LMOM for a return call. Needs appointment on OR day and cancel the 01/18/2018 appt.

## 2018-01-01 ENCOUNTER — Other Ambulatory Visit: Payer: Self-pay

## 2018-01-01 DIAGNOSIS — Z86010 Personal history of colonic polyps: Secondary | ICD-10-CM

## 2018-01-01 NOTE — Telephone Encounter (Signed)
Will mail instructions when I get her Pre-op appt.

## 2018-01-05 MED ORDER — NA SULFATE-K SULFATE-MG SULF 17.5-3.13-1.6 GM/177ML PO SOLN: 1.0000 | Freq: Once | ORAL | 0 refills | Status: AC

## 2018-01-05 NOTE — Telephone Encounter (Signed)
Pre-op appt is 01/27/2018 at 10:00 Am.

## 2018-01-05 NOTE — Telephone Encounter (Signed)
Rx sent to the pharmacy and instructions mailed to pt.  

## 2018-01-07 MED ORDER — SOD PICOSULFATE-MAG OX-CIT ACD 10-3.5-12 MG-GM-GM PO PACK: 1.0000 | PACK | Freq: Once | ORAL | 0 refills | Status: AC

## 2018-01-07 NOTE — Addendum Note (Signed)
Addended by: Everardo All on: 01/07/2018 01:11 PM   Modules accepted: Orders

## 2018-01-08 NOTE — Telephone Encounter (Signed)
Suprep and Prepopik too expensive. I told the pharmacist to cancel both orders and I will give her a sample. Clenpiq sample and instructions at front for pick up.

## 2018-01-08 NOTE — Telephone Encounter (Signed)
Pt is aware and will be by to pick up at her convenience.

## 2018-01-25 NOTE — Patient Instructions (Signed)
Kathy Evans  01/25/2018     @PREFPERIOPPHARMACY @   Your procedure is scheduled on  02/02/2018   Report to [facility] at  51   A.M.  Call this number if you have problems the morning of surgery:  [phone]   Remember:  Do not eat food or drink liquids after midnight.  Take these medicines the morning of surgery with A SIP OF WATER  Xanax, prilosec.   Do not wear jewelry, make-up or nail polish.  Do not wear lotions, powders, or perfumes, or deodorant.  Do not shave 48 hours prior to surgery.  Men may shave face and neck.  Do not bring valuables to the hospital.  [facility] is not responsible for any belongings or valuables.  Contacts, dentures or bridgework may not be worn into surgery.  Leave your suitcase in the car.  After surgery it may be brought to your room.  For patients admitted to the hospital, discharge time will be determined by your treatment team.  Patients discharged the day of surgery will not be allowed to drive home.   Name and phone number of your driver:   family Special instructions:  Follow the diet and prep instructions given to you by Dr Nona Dell.  Please read over the following fact sheets that you were given. Anesthesia Post-op Instructions and Care and Recovery After Surgery       Colonoscopy, Adult A colonoscopy is an exam to look at the large intestine. It is done to check for problems, such as:  Lumps (tumors).  Growths (polyps).  Swelling (inflammation).  Bleeding.  What happens before the procedure? Eating and drinking Follow instructions from your doctor about eating and drinking. These instructions may include:  A few days before the procedure - follow a low-fiber diet. ? Avoid nuts. ? Avoid seeds. ? Avoid dried fruit. ? Avoid raw fruits. ? Avoid vegetables.  1-3 days before the procedure - follow a clear liquid diet. Avoid liquids that have red or purple dye. Drink only clear liquids, such  as: ? Clear broth or bouillon. ? Black coffee or tea. ? Clear juice. ? Clear soft drinks or sports drinks. ? Gelatin dessert. ? Popsicles.  On the day of the procedure - do not eat or drink anything during the 2 hours before the procedure.  Bowel prep If you were prescribed an oral bowel prep:  Take it as told by your doctor. Starting the day before your procedure, you will need to drink a lot of liquid. The liquid will cause you to poop (have bowel movements) until your poop is almost clear or light green.  If your skin or butt gets irritated from diarrhea, you may: ? Wipe the area with wipes that have medicine in them, such as adult wet wipes with aloe and vitamin E. ? Put something on your skin that soothes the area, such as petroleum jelly.  If you throw up (vomit) while drinking the bowel prep, take a break for up to 60 minutes. Then begin the bowel prep again. If you keep throwing up and you cannot take the bowel prep without throwing up, call your doctor.  General instructions  Ask your doctor about changing or stopping your normal medicines. This is important if you take diabetes medicines or blood thinners.  Plan to have someone take you home from the hospital or clinic. What happens during the procedure?  An IV tube may be put  into one of your veins.  You will be given medicine to help you relax (sedative).  To reduce your risk of infection: ? Your doctors will wash their hands. ? Your anal area will be washed with soap.  You will be asked to lie on your side with your knees bent.  Your doctor will get a long, thin, flexible tube ready. The tube will have a camera and a light on the end.  The tube will be put into your anus.  The tube will be gently put into your large intestine.  Air will be delivered into your large intestine to keep it open. You may feel some pressure or cramping.  The camera will be used to take photos.  A small tissue sample may be  removed from your body to be looked at under a microscope (biopsy). If any possible problems are found, the tissue will be sent to a lab for testing.  If small growths are found, your doctor may remove them and have them checked for cancer.  The tube that was put into your anus will be slowly removed. The procedure may vary among doctors and hospitals. What happens after the procedure?  Your doctor will check on you often until the medicines you were given have worn off.  Do not drive for 24 hours after the procedure.  You may have a small amount of blood in your poop.  You may pass gas.  You may have mild cramps or bloating in your belly (abdomen).  It is up to you to get the results of your procedure. Ask your doctor, or the department performing the procedure, when your results will be ready. This information is not intended to replace advice given to you by your health care provider. Make sure you discuss any questions you have with your health care provider. Document Released: 12/06/2010 Document Revised: 09/03/2016 Document Reviewed: 01/15/2016 Elsevier Interactive Patient Education  2017 [employer] Inc[employer]  Colonoscopy, Adult, Care After This sheet gives you information about how to care for yourself after your procedure. Your health care provider may also give you more specific instructions. If you have problems or questions, contact your health care provider. What can I expect after the procedure? After the procedure, it is common to have:  A small amount of blood in your stool for 24 hours after the procedure.  Some gas.  Mild abdominal cramping or bloating.  Follow these instructions at home: General instructions   For the first 24 hours after the procedure: ? Do not drive or use machinery. ? Do not sign important documents. ? Do not drink alcohol. ? Do your regular daily activities at a slower pace than normal. ? Eat soft, easy-to-digest foods. ? Rest  often.  Take over-the-counter or prescription medicines only as told by your health care provider.  It is up to you to get the results of your procedure. Ask your health care provider, or the department performing the procedure, when your results will be ready. Relieving cramping and bloating  Try walking around when you have cramps or feel bloated.  Apply heat to your abdomen as told by your health care provider. Use a heat source that your health care provider recommends, such as a moist heat pack or a heating pad. ? Place a towel between your skin and the heat source. ? Leave the heat on for 20-30 minutes. ? Remove the heat if your skin turns bright red. This is especially important if you are unable  to feel pain, heat, or cold. You may have a greater risk of getting burned. Eating and drinking  Drink enough fluid to keep your urine clear or pale yellow.  Resume your normal diet as instructed by your health care provider. Avoid heavy or fried foods that are hard to digest.  Avoid drinking alcohol for as long as instructed by your health care provider. Contact a health care provider if:  You have blood in your stool 2-3 days after the procedure. Get help right away if:  You have more than a small spotting of blood in your stool.  You pass large blood clots in your stool.  Your abdomen is swollen.  You have nausea or vomiting.  You have a fever.  You have increasing abdominal pain that is not relieved with medicine. This information is not intended to replace advice given to you by your health care provider. Make sure you discuss any questions you have with your health care provider. Document Released: 06/17/2004 Document Revised: 07/28/2016 Document Reviewed: 01/15/2016 Elsevier Interactive Patient Education  2018 [place] Anesthesia is a term that refers to techniques, procedures, and medicines that help a person stay safe and comfortable  during a medical procedure. Monitored anesthesia care, or sedation, is one type of anesthesia. Your anesthesia specialist may recommend sedation if you will be having a procedure that does not require you to be unconscious, such as:  Cataract surgery.  A dental procedure.  A biopsy.  A colonoscopy.  During the procedure, you may receive a medicine to help you relax (sedative). There are three levels of sedation:  Mild sedation. At this level, you may feel awake and relaxed. You will be able to follow directions.  Moderate sedation. At this level, you will be sleepy. You may not remember the procedure.  Deep sedation. At this level, you will be asleep. You will not remember the procedure.  The more medicine you are given, the deeper your level of sedation will be. Depending on how you respond to the procedure, the anesthesia specialist may change your level of sedation or the type of anesthesia to fit your needs. An anesthesia specialist will monitor you closely during the procedure. Let your health care provider know about:  Any allergies you have.  All medicines you are taking, including vitamins, herbs, eye drops, creams, and over-the-counter medicines.  Any use of steroids (by mouth or as a cream).  Any problems you or family members have had with sedatives and anesthetic medicines.  Any blood disorders you have.  Any surgeries you have had.  Any medical conditions you have, such as sleep apnea.  Whether you are pregnant or may be pregnant.  Any use of cigarettes, alcohol, or street drugs. What are the risks? Generally, this is a safe procedure. However, problems may occur, including:  Getting too much medicine (oversedation).  Nausea.  Allergic reaction to medicines.  Trouble breathing. If this happens, a breathing tube may be used to help with breathing. It will be removed when you are awake and breathing on your own.  Heart trouble.  Lung trouble.  Before  the procedure Staying hydrated Follow instructions from your health care provider about hydration, which may include:  Up to 2 hours before the procedure - you may continue to drink clear liquids, such as water, clear fruit juice, black coffee, and plain tea.  Eating and drinking restrictions Follow instructions from your health care provider about eating and drinking, which may  include:  8 hours before the procedure - stop eating heavy meals or foods such as meat, fried foods, or fatty foods.  6 hours before the procedure - stop eating light meals or foods, such as toast or cereal.  6 hours before the procedure - stop drinking milk or drinks that contain milk.  2 hours before the procedure - stop drinking clear liquids.  Medicines Ask your health care provider about:  Changing or stopping your regular medicines. This is especially important if you are taking diabetes medicines or blood thinners.  Taking medicines such as aspirin and ibuprofen. These medicines can thin your blood. Do not take these medicines before your procedure if your health care provider instructs you not to.  Tests and exams  You will have a physical exam.  You may have blood tests done to show: ? How well your kidneys and liver are working. ? How well your blood can clot.  General instructions  Plan to have someone take you home from the hospital or clinic.  If you will be going home right after the procedure, plan to have someone with you for 24 hours.  What happens during the procedure?  Your blood pressure, heart rate, breathing, level of pain and overall condition will be monitored.  An IV tube will be inserted into one of your veins.  Your anesthesia specialist will give you medicines as needed to keep you comfortable during the procedure. This may mean changing the level of sedation.  The procedure will be performed. After the procedure  Your blood pressure, heart rate, breathing rate, and  blood oxygen level will be monitored until the medicines you were given have worn off.  Do not drive for 24 hours if you received a sedative.  You may: ? Feel sleepy, clumsy, or nauseous. ? Feel forgetful about what happened after the procedure. ? Have a sore throat if you had a breathing tube during the procedure. ? Vomit. This information is not intended to replace advice given to you by your health care provider. Make sure you discuss any questions you have with your health care provider. Document Released: 07/30/2005 Document Revised: 04/11/2016 Document Reviewed: 02/24/2016 Elsevier Interactive Patient Education  2018 [place], Care After These instructions provide you with information about caring for yourself after your procedure. Your health care provider may also give you more specific instructions. Your treatment has been planned according to current medical practices, but problems sometimes occur. Call your health care provider if you have any problems or questions after your procedure. What can I expect after the procedure? After your procedure, it is common to:  Feel sleepy for several hours.  Feel clumsy and have poor balance for several hours.  Feel forgetful about what happened after the procedure.  Have poor judgment for several hours.  Feel nauseous or vomit.  Have a sore throat if you had a breathing tube during the procedure.  Follow these instructions at home: For at least 24 hours after the procedure:   Do not: ? Participate in activities in which you could fall or become injured. ? Drive. ? Use heavy machinery. ? Drink alcohol. ? Take sleeping pills or medicines that cause drowsiness. ? Make important decisions or sign legal documents. ? Take care of children on your own.  Rest. Eating and drinking  Follow the diet that is recommended by your health care provider.  If you vomit, drink water, juice, or soup when you  can drink  without vomiting.  Make sure you have little or no nausea before eating solid foods. General instructions  Have a responsible adult stay with you until you are awake and alert.  Take over-the-counter and prescription medicines only as told by your health care provider.  If you smoke, do not smoke without supervision.  Keep all follow-up visits as told by your health care provider. This is important. Contact a health care provider if:  You keep feeling nauseous or you keep vomiting.  You feel light-headed.  You develop a rash.  You have a fever. Get help right away if:  You have trouble breathing. This information is not intended to replace advice given to you by your health care provider. Make sure you discuss any questions you have with your health care provider. Document Released: 02/24/2016 Document Revised: 06/25/2016 Document Reviewed: 02/24/2016 Elsevier Interactive Patient Education  [employer].

## 2018-01-27 ENCOUNTER — Encounter (HOSPITAL_COMMUNITY)
Admission: RE | Admit: 2018-01-27 | Discharge: 2018-01-27 | Disposition: A | Discharge status: Home / Self Care | Payer: Medicare Other | Source: Ambulatory Visit | Attending: Gastroenterology | Admitting: Gastroenterology

## 2018-01-27 ENCOUNTER — Encounter (HOSPITAL_COMMUNITY): Payer: Self-pay

## 2018-01-27 ENCOUNTER — Other Ambulatory Visit: Payer: Self-pay

## 2018-01-27 DIAGNOSIS — Z01818 Encounter for other preprocedural examination: Secondary | ICD-10-CM | POA: Diagnosis not present

## 2018-01-27 DIAGNOSIS — R9431 Abnormal electrocardiogram [ECG] [EKG]: Secondary | ICD-10-CM | POA: Diagnosis not present

## 2018-01-27 HISTORY — DX: Unspecified osteoarthritis, unspecified site: M19.90

## 2018-01-27 LAB — BASIC METABOLIC PANEL
ANION GAP: 10 (ref 5–15)
BUN: 14 mg/dL (ref 6–20)
CALCIUM: 9.3 mg/dL (ref 8.9–10.3)
CO2: 24 mmol/L (ref 22–32)
CREATININE: 0.81 mg/dL (ref 0.44–1.00)
Chloride: 106 mmol/L (ref 101–111)
Glucose, Bld: 94 mg/dL (ref 65–99)
Potassium: 4 mmol/L (ref 3.5–5.1)
SODIUM: 140 mmol/L (ref 135–145)

## 2018-01-27 LAB — CBC
HEMATOCRIT: 39.7 % (ref 36.0–46.0)
HEMOGLOBIN: 12.8 g/dL (ref 12.0–15.0)
MCH: 30.2 pg (ref 26.0–34.0)
MCHC: 32.2 g/dL (ref 30.0–36.0)
MCV: 93.6 fL (ref 78.0–100.0)
Platelets: 200 10*3/uL (ref 150–400)
RBC: 4.24 MIL/uL (ref 3.87–5.11)
RDW: 13.5 % (ref 11.5–15.5)
WBC: 6.3 10*3/uL (ref 4.0–10.5)

## 2018-02-01 ENCOUNTER — Other Ambulatory Visit: Payer: Self-pay | Admitting: *Deleted

## 2018-02-01 DIAGNOSIS — R9431 Abnormal electrocardiogram [ECG] [EKG]: Secondary | ICD-10-CM

## 2018-02-01 NOTE — Progress Notes (Signed)
cc'd to pcp 

## 2018-02-01 NOTE — Progress Notes (Signed)
Pt is aware. Said she previously had Cardiologist in [place]. OK to refer to [facility].

## 2018-02-02 ENCOUNTER — Encounter (HOSPITAL_COMMUNITY): Payer: Self-pay

## 2018-02-02 ENCOUNTER — Ambulatory Visit (HOSPITAL_COMMUNITY)
Admission: RE | Admit: 2018-02-02 | Discharge: 2018-02-02 | Disposition: A | Discharge status: Home / Self Care | Payer: Medicare Other | Source: Ambulatory Visit | Attending: Gastroenterology | Admitting: Gastroenterology

## 2018-02-02 ENCOUNTER — Ambulatory Visit (HOSPITAL_COMMUNITY): Payer: Medicare Other | Admitting: Anesthesiology

## 2018-02-02 ENCOUNTER — Encounter (HOSPITAL_COMMUNITY)
Admission: RE | Disposition: A | Discharge status: Home / Self Care | Payer: Self-pay | Source: Ambulatory Visit | Attending: Gastroenterology

## 2018-02-02 ENCOUNTER — Other Ambulatory Visit: Payer: Self-pay

## 2018-02-02 DIAGNOSIS — Z853 Personal history of malignant neoplasm of breast: Secondary | ICD-10-CM | POA: Insufficient documentation

## 2018-02-02 DIAGNOSIS — M1990 Unspecified osteoarthritis, unspecified site: Secondary | ICD-10-CM | POA: Insufficient documentation

## 2018-02-02 DIAGNOSIS — Z86010 Personal history of colonic polyps: Secondary | ICD-10-CM | POA: Diagnosis not present

## 2018-02-02 DIAGNOSIS — Z79899 Other long term (current) drug therapy: Secondary | ICD-10-CM | POA: Diagnosis not present

## 2018-02-02 DIAGNOSIS — Z87891 Personal history of nicotine dependence: Secondary | ICD-10-CM | POA: Insufficient documentation

## 2018-02-02 DIAGNOSIS — K648 Other hemorrhoids: Secondary | ICD-10-CM | POA: Diagnosis not present

## 2018-02-02 DIAGNOSIS — K5730 Diverticulosis of large intestine without perforation or abscess without bleeding: Secondary | ICD-10-CM | POA: Diagnosis not present

## 2018-02-02 DIAGNOSIS — K644 Residual hemorrhoidal skin tags: Secondary | ICD-10-CM | POA: Insufficient documentation

## 2018-02-02 DIAGNOSIS — E785 Hyperlipidemia, unspecified: Secondary | ICD-10-CM | POA: Diagnosis not present

## 2018-02-02 DIAGNOSIS — F329 Major depressive disorder, single episode, unspecified: Secondary | ICD-10-CM | POA: Insufficient documentation

## 2018-02-02 DIAGNOSIS — Z923 Personal history of irradiation: Secondary | ICD-10-CM | POA: Insufficient documentation

## 2018-02-02 DIAGNOSIS — Z8249 Family history of ischemic heart disease and other diseases of the circulatory system: Secondary | ICD-10-CM | POA: Insufficient documentation

## 2018-02-02 DIAGNOSIS — K219 Gastro-esophageal reflux disease without esophagitis: Secondary | ICD-10-CM | POA: Insufficient documentation

## 2018-02-02 DIAGNOSIS — R195 Other fecal abnormalities: Secondary | ICD-10-CM

## 2018-02-02 DIAGNOSIS — K589 Irritable bowel syndrome without diarrhea: Secondary | ICD-10-CM | POA: Diagnosis not present

## 2018-02-02 HISTORY — PX: COLONOSCOPY WITH PROPOFOL: SHX5780

## 2018-02-02 SURGERY — COLONOSCOPY WITH PROPOFOL
Anesthesia: Monitor Anesthesia Care

## 2018-02-02 MED ORDER — ONDANSETRON HCL 4 MG/2ML IJ SOLN
INTRAMUSCULAR | Status: AC
  Filled 2018-02-02: qty 2

## 2018-02-02 MED ORDER — FENTANYL CITRATE (PF) 100 MCG/2ML IJ SOLN
INTRAMUSCULAR | Status: AC
  Filled 2018-02-02: qty 2

## 2018-02-02 MED ORDER — LACTATED RINGERS IV SOLN
INTRAVENOUS | Status: DC
  Administered 2018-02-02: 10:00:00 via INTRAVENOUS

## 2018-02-02 MED ORDER — MIDAZOLAM HCL 2 MG/2ML IJ SOLN
1.0000 mg | INTRAMUSCULAR | Status: AC
  Administered 2018-02-02: 2 mg via INTRAVENOUS
  Filled 2018-02-02: qty 2

## 2018-02-02 MED ORDER — PROPOFOL 500 MG/50ML IV EMUL
INTRAVENOUS | Status: DC | PRN
  Administered 2018-02-02: 75 ug/kg/min via INTRAVENOUS

## 2018-02-02 MED ORDER — ONDANSETRON HCL 4 MG/2ML IJ SOLN
4.0000 mg | Freq: Once | INTRAMUSCULAR | Status: AC
  Administered 2018-02-02: 4 mg via INTRAVENOUS

## 2018-02-02 MED ORDER — PROPOFOL 10 MG/ML IV BOLUS
INTRAVENOUS | Status: AC
  Filled 2018-02-02: qty 40

## 2018-02-02 MED ORDER — FENTANYL CITRATE (PF) 100 MCG/2ML IJ SOLN
25.0000 ug | Freq: Once | INTRAMUSCULAR | Status: AC
  Administered 2018-02-02: 25 ug via INTRAVENOUS

## 2018-02-02 MED ORDER — MIDAZOLAM HCL 2 MG/2ML IJ SOLN
INTRAMUSCULAR | Status: AC
  Filled 2018-02-02: qty 2

## 2018-02-02 MED ORDER — PROPOFOL 10 MG/ML IV BOLUS
INTRAVENOUS | Status: DC | PRN
  Administered 2018-02-02: 15 mg via INTRAVENOUS
  Administered 2018-02-02: 20 mg via INTRAVENOUS
  Administered 2018-02-02 (×3): 15 mg via INTRAVENOUS

## 2018-02-02 NOTE — H&P (Signed)
Primary Care Physician:  Celene Squibb, MD Primary Gastroenterologist:  Dr. Oneida Alar  Pre-Procedure History & Physical: HPI:  Kathy Evans is a 67 y.o. female here for heme positive stools.  Past Medical History:  Diagnosis Date  . Acid reflux   . Arthritis   . Breast cancer ([place]) 03/2017   right  . Breast disorder    cancer  . Bronchitis   . Depression   . GERD (gastroesophageal reflux disease)   . Hyperlipidemia   . IBS (irritable bowel syndrome)   . Personal history of radiation therapy 2018    Past Surgical History:  Procedure Laterality Date  . ADENOIDECTOMY    . APPENDECTOMY    . BREAST BIOPSY Left    benign  . BREAST LUMPECTOMY Right 2018  . BUNIONECTOMY Right   . CARDIAC CATHETERIZATION    . CATARACT EXTRACTION, BILATERAL    . CHOLECYSTECTOMY    . colon obstruction     lysis of adhesions (no colon or small bowel resections)   . full mastectomy Right   . PROLAPSED UTERINE FIBROID LIGATION    . TONSILLECTOMY    . TUBAL LIGATION    . VAGINAL HYSTERECTOMY    . VESICOVAGINAL FISTULA CLOSURE      Prior to Admission medications   Medication Sig Start Date End Date Taking? Authorizing Provider  ALPRAZolam Duanne Moron) 0.5 MG tablet Take 0.5 mg by mouth daily as needed for anxiety.    Yes [provider]  anastrozole (ARIMIDEX) 1 MG tablet Take 1 mg by mouth at bedtime.    Yes [provider]  buPROPion (WELLBUTRIN SR) 150 MG 12 hr tablet Take 150 mg by mouth at bedtime.    Yes [provider]  diphenhydrAMINE (BENADRYL) 25 MG tablet Take 25 mg by mouth every 6 (six) hours as needed.   Yes [provider]  fluticasone (FLONASE) 50 MCG/ACT nasal spray Place 1-2 sprays into both nostrils daily as needed for allergies or rhinitis.    Yes [provider]  ibuprofen (ADVIL,MOTRIN) 200 MG tablet Take 200-400 mg by mouth every 8 (eight) hours as needed (for pain.).   Yes [provider]  Javier Docker Oil 1000 MG CAPS Take 1,000 mg  by mouth daily.   Yes [provider]  linaclotide (LINZESS) 72 MCG capsule Take 72 mcg by mouth daily as needed (for constipation).    Yes [provider]  Multiple Vitamin (MULTIVITAMIN WITH MINERALS) TABS tablet Take 1 tablet by mouth daily. [employer]   Yes [provider]  naproxen sodium (ALEVE) 220 MG tablet Take 220 mg by mouth 2 (two) times daily as needed (for pain.).   Yes [provider]  omeprazole (PRILOSEC) 40 MG capsule Take 40 mg by mouth daily before breakfast.    Yes [provider]  Probiotic Product (RA PROBIOTIC COMPLEX PO) Take 1 capsule by mouth daily.   Yes [provider]  tetrahydrozoline-zinc (VISINE-AC) 0.05-0.25 % ophthalmic solution Place 1-2 drops into both eyes 3 (three) times daily as needed (for irritated/itchy/allergy eyes.).   Yes [provider]    Allergies as of 01/01/2018  . (No Known Allergies)    Family History  Problem Relation Age of Onset  . Diabetes Maternal Grandmother   . Alzheimer's disease Maternal Grandmother   . Heart disease Father   . Cancer Mother        uterine  . Diabetes Mother   . Heart disease Brother   . Diabetes Brother   .  Cancer Sister        lung  . Diabetes Sister   . Heart disease Sister        had stent placed  . Heart disease Brother   . Cancer Sister        uterine  . Diabetes Sister   . High blood pressure Sister   . Colon cancer Neg Hx     Social History   Socioeconomic History  . Marital status: Married    Spouse name: Not on file  . Number of children: Not on file  . Years of education: Not on file  . Highest education level: Not on file  Social Needs  . Financial resource strain: Not on file  . Food insecurity - worry: Not on file  . Food insecurity - inability: Not on file  . Transportation needs - medical: Not on file  . Transportation needs - non-medical: Not on file  Occupational History  . Occupation: retired  Tobacco  Use  . Smoking status: Former Smoker    Packs/day: 1.50    Years: 35.00    Pack years: 52.50    Types: Cigarettes    Last attempt to quit: 2000    Years since quitting: 19.2  . Smokeless tobacco: Never Used  Substance and Sexual Activity  . Alcohol use: Yes    Comment: very seldom  . Drug use: No  . Sexual activity: Not Currently    Birth control/protection: Surgical    Comment: hyst  Other Topics Concern  . Not on file  Social History Narrative  . Not on file    Review of Systems: See HPI, otherwise negative ROS   Physical Exam: BP 124/61   Pulse 69   Temp 97.9 F (36.6 C) (Oral)   Resp 17   SpO2 96%  General:   Alert,  pleasant and cooperative in NAD Head:  Normocephalic and atraumatic. Neck:  Supple; Lungs:  Clear throughout to auscultation.    Heart:  Regular rate and rhythm. Abdomen:  Soft, nontender and nondistended. Normal bowel sounds, without guarding, and without rebound.   Neurologic:  Alert and  oriented x4;  grossly normal neurologically.  Impression/Plan:     HEME POS STOOLS  PLAN:  1.TCS TODAY. DISCUSSED PROCEDURE, BENEFITS, & RISKS: < 1% chance of medication reaction, bleeding, perforation, or rupture of spleen/liver.

## 2018-02-02 NOTE — Transfer of Care (Signed)
Immediate Anesthesia Transfer of Care Note  Patient: Marcayla Budge  Procedure(s) Performed: COLONOSCOPY WITH PROPOFOL (N/A )  Patient Location: PACU  Anesthesia Type:MAC  Level of Consciousness: awake and patient cooperative  Airway & Oxygen Therapy: Patient Spontanous Breathing and non-rebreather face mask  Post-op Assessment: Report given to RN and Post -op Vital signs reviewed and stable  Post vital signs: Reviewed and stable  Last Vitals:  Vitals:   02/02/18 1110 02/02/18 1115  BP:  127/69  Pulse:    Resp: (!) 25 15  Temp:    SpO2: 98% 97%    Last Pain:  Vitals:   02/02/18 1127  TempSrc:   PainSc: 2       Patients Stated Pain Goal: 5 (79/48/01 6553)  Complications: No apparent anesthesia complications

## 2018-02-02 NOTE — Discharge Instructions (Signed)
You DID NOT HAVE ANY POLYPS. YOU HAVE DIVERTICULOSIS IN YOUR LEFT COLON. You have small internal AND MODERATE EXTERNAL hemorrhoids.   DRINK WATER TO KEEP URINE LIGHT YELLOW.  FOLLOW A HIGH FIBER DIET. AVOID ITEMS THAT CAUSE BLOATING & GAS. SEE INFO BELOW.  USE PREPARATION H FOUR TIMES  A DAY IF NEEDED TO RELIEVE RECTAL PAIN/PRESSURE/BLEEDING.  Next colonoscopy in 10-15 years if the benefits outweigh the risks.   Colonoscopy Care After Read the instructions outlined below and refer to this sheet in the next week. These discharge instructions provide you with general information on caring for yourself after you leave the hospital. While your treatment has been planned according to the most current medical practices available, unavoidable complications occasionally occur. If you have any problems or questions after discharge, call DR. FIELDS, [phone].  ACTIVITY  You may resume your regular activity, but move at a slower pace for the next 24 hours.   Take frequent rest periods for the next 24 hours.   Walking will help get rid of the air and reduce the bloated feeling in your belly (abdomen).   No driving for 24 hours (because of the medicine (anesthesia) used during the test).   You may shower.   Do not sign any important legal documents or operate any machinery for 24 hours (because of the anesthesia used during the test).    NUTRITION  Drink plenty of fluids.   You may resume your normal diet as instructed by your doctor.   Begin with a light meal and progress to your normal diet. Heavy or fried foods are harder to digest and may make you feel sick to your stomach (nauseated).   Avoid alcoholic beverages for 24 hours or as instructed.    MEDICATIONS  You may resume your normal medications.   WHAT YOU CAN EXPECT TODAY  Some feelings of bloating in the abdomen.   Passage of more gas than usual.   Spotting of blood in your stool or on the toilet paper  .  IF  YOU HAD POLYPS REMOVED DURING THE COLONOSCOPY:  Eat a soft diet IF YOU HAVE NAUSEA, BLOATING, ABDOMINAL PAIN, OR VOMITING.    FINDING OUT THE RESULTS OF YOUR TEST Not all test results are available during your visit. DR. Oneida Alar WILL CALL YOU WITHIN 7 DAYS OF YOUR PROCEDUE WITH YOUR RESULTS. Do not assume everything is normal if you have not heard from DR. FIELDS IN ONE WEEK, CALL HER OFFICE AT [phone].  SEEK IMMEDIATE MEDICAL ATTENTION AND CALL THE OFFICE: [phone] IF:  You have more than a spotting of blood in your stool.   Your belly is swollen (abdominal distention).   You are nauseated or vomiting.   You have a temperature over 101F.   You have abdominal pain or discomfort that is severe or gets worse throughout the day.    High-Fiber Diet A high-fiber diet changes your normal diet to include more whole grains, legumes, fruits, and vegetables. Changes in the diet involve replacing refined carbohydrates with unrefined foods. The calorie level of the diet is essentially unchanged. The Dietary Reference Intake (recommended amount) for adult males is 38 grams per day. For adult females, it is 25 grams per day. Pregnant and lactating women should consume 28 grams of fiber per day. Fiber is the intact part of a plant that is not broken down during digestion. Functional fiber is fiber that has been isolated from the plant to provide a beneficial effect in the body.  PURPOSE  Increase stool bulk.   Ease and regulate bowel movements.   Lower cholesterol.   REDUCE RISK OF COLON CANCER  INDICATIONS THAT YOU NEED MORE FIBER  Constipation and hemorrhoids.   Uncomplicated diverticulosis (intestine condition) and irritable bowel syndrome.   Weight management.   As a protective measure against hardening of the arteries (atherosclerosis), diabetes, and cancer.   GUIDELINES FOR INCREASING FIBER IN THE DIET  Start adding fiber to the diet slowly. A gradual increase of about 5  more grams (2 slices of whole-wheat bread, 2 servings of most fruits or vegetables, or 1 bowl of high-fiber cereal) per day is best. Too rapid an increase in fiber may result in constipation, flatulence, and bloating.   Drink enough water and fluids to keep your urine clear or pale yellow. Water, juice, or caffeine-free drinks are recommended. Not drinking enough fluid may cause constipation.   Eat a variety of high-fiber foods rather than one type of fiber.   Try to increase your intake of fiber through using high-fiber foods rather than fiber pills or supplements that contain small amounts of fiber.   The goal is to change the types of food eaten. Do not supplement your present diet with high-fiber foods, but replace foods in your present diet.   INCLUDE A VARIETY OF FIBER SOURCES  Replace refined and processed grains with whole grains, canned fruits with fresh fruits, and incorporate other fiber sources. White rice, white breads, and most bakery goods contain little or no fiber.   Brown whole-grain rice, buckwheat oats, and many fruits and vegetables are all good sources of fiber. These include: broccoli, Brussels sprouts, cabbage, cauliflower, beets, sweet potatoes, white potatoes (skin on), carrots, tomatoes, eggplant, squash, berries, fresh fruits, and dried fruits.   Cereals appear to be the richest source of fiber. Cereal fiber is found in whole grains and bran. Bran is the fiber-rich outer coat of cereal grain, which is largely removed in refining. In whole-grain cereals, the bran remains. In breakfast cereals, the largest amount of fiber is found in those with "bran" in their names. The fiber content is sometimes indicated on the label.   You may need to include additional fruits and vegetables each day.   In baking, for 1 cup white flour, you may use the following substitutions:   1 cup whole-wheat flour minus 2 tablespoons.   1/2 cup white flour plus 1/2 cup whole-wheat flour.    Diverticulosis Diverticulosis is a common condition that develops when small pouches (diverticula) form in the wall of the colon. The risk of diverticulosis increases with age. It happens more often in people who eat a low-fiber diet. Most individuals with diverticulosis have no symptoms. Those individuals with symptoms usually experience belly (abdominal) pain, constipation, or loose stools (diarrhea).  HOME CARE INSTRUCTIONS  Increase the amount of fiber in your diet as directed by your caregiver or dietician. This may reduce symptoms of diverticulosis.   Drink at least 6 to 8 glasses of water each day to prevent constipation.   Try not to strain when you have a bowel movement.   THERE IS NO NEED TO Avoid nuts and seeds to prevent complications.   FOODS HAVING HIGH FIBER CONTENT INCLUDE:  Fruits. Apple, peach, pear, tangerine, raisins, prunes.   Vegetables. Brussels sprouts, asparagus, broccoli, cabbage, carrot, cauliflower, romaine lettuce, spinach, summer squash, tomato, winter squash, zucchini.   Starchy Vegetables. Baked beans, kidney beans, lima beans, split peas, lentils, potatoes (with skin).   Grains.  Whole wheat bread, brown rice, bran flake cereal, plain oatmeal, white rice, shredded wheat, bran muffins.    Hemorrhoids Hemorrhoids are dilated (enlarged) veins around the rectum. Sometimes clots will form in the veins. This makes them swollen and painful. These are called thrombosed hemorrhoids. Causes of hemorrhoids include:  Constipation.   Straining to have a bowel movement.   HEAVY LIFTING  HOME CARE INSTRUCTIONS  Eat a well balanced diet and drink 6 to 8 glasses of water every day to avoid constipation. You may also use a bulk laxative.   Avoid straining to have bowel movements.   Keep anal area dry and clean.   Do not use a donut shaped pillow or sit on the toilet for long periods. This increases blood pooling and pain.   Move your bowels when your body  has the urge; this will require less straining and will decrease pain and pressure.

## 2018-02-02 NOTE — Anesthesia Preprocedure Evaluation (Signed)
Anesthesia Evaluation  Patient identified by MRN, date of birth, ID band Patient awake    Reviewed: Allergy & Precautions, NPO status , Patient's Chart, lab work & pertinent test results  Airway Mallampati: I  TM Distance: >3 FB Neck ROM: Full    Dental  (+) Teeth Intact   Pulmonary former smoker,    breath sounds clear to auscultation       Cardiovascular negative cardio ROS   Rhythm:Regular Rate:Normal     Neuro/Psych PSYCHIATRIC DISORDERS Depression    GI/Hepatic GERD  Medicated and Controlled,  Endo/Other    Renal/GU      Musculoskeletal   Abdominal   Peds  Hematology   Anesthesia Other Findings   Reproductive/Obstetrics                             Anesthesia Physical Anesthesia Plan  ASA: III  Anesthesia Plan: MAC   Post-op Pain Management:    Induction: Intravenous  PONV Risk Score and Plan:   Airway Management Planned: Simple Face Mask  Additional Equipment:   Intra-op Plan:   Post-operative Plan:   Informed Consent: I have reviewed the patients History and Physical, chart, labs and discussed the procedure including the risks, benefits and alternatives for the proposed anesthesia with the patient or authorized representative who has indicated his/her understanding and acceptance.     Plan Discussed with:   Anesthesia Plan Comments:         Anesthesia Quick Evaluation

## 2018-02-02 NOTE — Op Note (Addendum)
[facility] Patient Name: Kathy Evans Procedure Date: 02/02/2018 11:09 AM MRN: [id number] Date of Birth: August 22, 1951 Attending MD: Barney Drain MD, MD CSN: [id number] Age: 67 Admit Type: Ambulatory Procedure:                Colonoscopy, DIAGNOSTIC Indications:              Heme positive stool. LAST TCS 2017-NO POLYPS Providers:                Barney Drain MD, MD, Jeanann Lewandowsky. Sharon Seller, RN, Nelma Rothman, Technician Referring MD:             Edwinna Areola. Hall MD Medicines:                Propofol per Anesthesia Complications:            No immediate complications. Estimated Blood Loss:     Estimated blood loss: none. Procedure:                Pre-Anesthesia Assessment:                           - Prior to the procedure, a History and Physical                            was performed, and patient medications and                            allergies were reviewed. The patient's tolerance of                            previous anesthesia was also reviewed. The risks                            and benefits of the procedure and the sedation                            options and risks were discussed with the patient.                            All questions were answered, and informed consent                            was obtained. Prior Anticoagulants: The patient has                            taken ibuprofen, last dose was 7 days prior to                            procedure. ASA Grade Assessment: II - A patient                            with mild systemic disease. After reviewing the  risks and benefits, the patient was deemed in                            satisfactory condition to undergo the procedure.                            After obtaining informed consent, the colonoscope                            was passed under direct vision. Throughout the                            procedure, the patient's blood pressure, pulse, and                      oxygen saturations were monitored continuously. The                            EC-[id number] ([id number]) scope was introduced through                            the anus and advanced to the 3 cm into the ileum.                            The terminal ileum, ileocecal valve, appendiceal                            orifice, and rectum were photographed. The                            colonoscopy was technically difficult and complex                            due to restricted mobility of the colon, the                            patient's body habitus and the patient's agitation.                            Successful completion of the procedure was aided by                            increasing the dose of sedation medication,                            straightening and shortening the scope to obtain                            bowel loop reduction and COLOWRAP. The patient                            tolerated the procedure fairly well. The quality of  the bowel preparation was excellent. Scope In: 11:41:37 AM Scope Out: 11:57:17 AM Scope Withdrawal Time: 0 hours 8 minutes 14 seconds  Total Procedure Duration: 0 hours 15 minutes 40 seconds  Findings:      Multiple small and large-mouthed diverticula were found in the       recto-sigmoid colon, sigmoid colon and descending colon.      The recto-sigmoid colon and sigmoid colon were significantly redundant.      The exam was otherwise without abnormality.      The terminal ileum appeared normal.      Internal hemorrhoids were found during retroflexion. The hemorrhoids       were small.      External hemorrhoids were found during retroflexion. The hemorrhoids       were moderate. Impression:               - Diverticulosis in the recto-sigmoid colon, in the                            sigmoid colon and in the descending colon.                           - Redundant colon.                           - The  examination was otherwise normal.                           - The examined portion of the ileum was normal.                           - Internal hemorrhoids.                           - External hemorrhoids. Moderate Sedation:      Per Anesthesia Care Recommendation:           - High fiber diet.                           - Continue present medications.                           - Repeat colonoscopy 10-15 YEARS IF THE BENEFITS                            OUTWEIGH THE RISKS for surveillance.                           - Patient has a contact number available for                            emergencies. The signs and symptoms of potential                            dela                           yed complications were discussed with the patient.  Return to normal activities tomorrow. Written                            discharge instructions were provided to the patient. Procedure Code(s):        --- Professional ---                           [phone], Colonoscopy, flexible; diagnostic, including                            collection of specimen(s) by brushing or washing,                            when performed (separate procedure) Diagnosis Code(s):        --- Professional ---                           K64.4, Residual hemorrhoidal skin tags                           K64.8, Other hemorrhoids                           R19.5, Other fecal abnormalities                           K57.30, Diverticulosis of large intestine without                            perforation or abscess without bleeding                           Q43.8, Other specified congenital malformations of                            intestine CPT copyright 2016 [employer]. All rights reserved. The codes documented in this report are preliminary and upon coder review may  be revised to meet current compliance requirements. Barney Drain, MD Barney Drain MD, MD 02/02/2018 12:08:20 PM This  report has been signed electronically. Number of Addenda: 0

## 2018-02-02 NOTE — Anesthesia Postprocedure Evaluation (Signed)
Anesthesia Post Note  Patient: Kathy Evans  Procedure(s) Performed: COLONOSCOPY WITH PROPOFOL (N/A )  Patient location during evaluation: PACU Anesthesia Type: MAC Level of consciousness: awake and alert and patient cooperative Pain management: satisfactory to patient Vital Signs Assessment: post-procedure vital signs reviewed and stable Respiratory status: spontaneous breathing Cardiovascular status: stable Postop Assessment: no apparent nausea or vomiting Anesthetic complications: no     Last Vitals:  Vitals:   02/02/18 1220 02/02/18 1230  BP:  114/64  Pulse: 62 60  Resp: 14 16  Temp:  36.6 C  SpO2: 97% 98%    Last Pain:  Vitals:   02/02/18 1230  TempSrc: Oral  PainSc:                  Drucie Opitz

## 2018-02-08 ENCOUNTER — Encounter (HOSPITAL_COMMUNITY): Payer: Self-pay | Admitting: Gastroenterology

## 2018-02-09 ENCOUNTER — Ambulatory Visit: Payer: Medicare Other | Admitting: Cardiovascular Disease

## 2018-02-09 ENCOUNTER — Encounter: Payer: Self-pay | Admitting: Cardiovascular Disease

## 2018-02-09 VITALS — BP 140/78 | HR 73 | Ht 63.5 in | Wt 210.0 lb

## 2018-02-09 DIAGNOSIS — R9431 Abnormal electrocardiogram [ECG] [EKG]: Secondary | ICD-10-CM

## 2018-02-09 NOTE — Progress Notes (Signed)
CARDIOLOGY CONSULT NOTE  Patient ID: Kathy Evans MRN: [id number] DOB/AGE: Feb 28, 1951 67 y.o.  Admit date: (Not on file) Primary Physician: Celene Squibb, MD Referring Physician: Dr. Oneida Alar  Reason for Consultation: Abnormal ECG  HPI: Kathy Evans is a 67 y.o. female who is being seen today for the evaluation of abnormal ECG at the request of Dr. Oneida Alar.  I personally reviewed the ECG performed on 01/27/18 which demonstrated sinus rhythm with septal Q waves and nonspecific T wave abnormalities.  Past medical history includes breast cancer diagnosed in May 2018 for which she underwent right partial mastectomy followed by adjuvant radiation.  She underwent a colonoscopy on 02/02/18 which I reviewed which demonstrated diverticulosis in both internal and external hemorrhoids.  She had been having heme positive stools.  She used to live outside of [place], [place].  They moved to [place] in August 2018 to get away from the cold weather.  She is here with her husband.  He has a history of myocardial infarction and stent.  He has yet to establish with a cardiologist.  Patient tells me she leads a sedentary lifestyle but is trying to walk more.  She said she does fine on level ground and denies exertional chest pain dyspnea.  She does get short of breath when climbing stairs but this is been going on for years.  She denies leg swelling, palpitations, lightheadedness, dizziness, orthopnea, and paroxysmal nocturnal dyspnea.  She said she underwent a nuclear stress test about 6 years ago and also underwent a normal cardiac catheterization in 2014 at [facility] in [place], [place].  Her cardiologist was Dr. Macario Carls.  She has been to the ED since her cath with complaints of chest pressure.  She was told she was not taking her omeprazole properly.  After she began taking about 30 minutes before meals, she has had no further symptoms.  Family history: Her  father died of an MI at the age of 33.  Both her brothers developed coronary disease in their 26s.  One has a stent in 1 underwent bypass surgery and also had an atrial fibrillation ablation.  Her sister had a coronary artery stent at the age of 101.     No Known Allergies  Current Outpatient Medications  Medication Sig Dispense Refill  . ALPRAZolam (XANAX) 0.5 MG tablet Take 0.5 mg by mouth daily as needed for anxiety.     Marland Kitchen anastrozole (ARIMIDEX) 1 MG tablet Take 1 mg by mouth at bedtime.     Marland Kitchen buPROPion (WELLBUTRIN SR) 150 MG 12 hr tablet Take 150 mg by mouth at bedtime.     . diphenhydrAMINE (BENADRYL) 25 MG tablet Take 25 mg by mouth every 6 (six) hours as needed.    . fluticasone (FLONASE) 50 MCG/ACT nasal spray Place 1-2 sprays into both nostrils daily as needed for allergies or rhinitis.     Marland Kitchen ibuprofen (ADVIL,MOTRIN) 200 MG tablet Take 200-400 mg by mouth every 8 (eight) hours as needed (for pain.).    Marland Kitchen Krill Oil 1000 MG CAPS Take 1,000 mg by mouth daily.    Marland Kitchen linaclotide (LINZESS) 72 MCG capsule Take 72 mcg by mouth daily as needed (for constipation).     . Multiple Vitamin (MULTIVITAMIN WITH MINERALS) TABS tablet Take 1 tablet by mouth daily. Investment banker, operational    . naproxen sodium (ALEVE) 220 MG tablet Take 220 mg by mouth 2 (two) times daily as needed (for pain.).    Marland Kitchen  omeprazole (PRILOSEC) 40 MG capsule Take 40 mg by mouth daily before breakfast.     . Probiotic Product (RA PROBIOTIC COMPLEX PO) Take 1 capsule by mouth daily.    Marland Kitchen tetrahydrozoline-zinc (VISINE-AC) 0.05-0.25 % ophthalmic solution Place 1-2 drops into both eyes 3 (three) times daily as needed (for irritated/itchy/allergy eyes.).     No current facility-administered medications for this visit.     Past Medical History:  Diagnosis Date  . Acid reflux   . Arthritis   . Breast cancer ([facility]) 03/2017   right  . Breast disorder    cancer  . Bronchitis   . Depression   . GERD (gastroesophageal reflux disease)   .  Hyperlipidemia   . IBS (irritable bowel syndrome)   . Personal history of radiation therapy 2018    Past Surgical History:  Procedure Laterality Date  . ADENOIDECTOMY    . APPENDECTOMY    . BREAST BIOPSY Left    benign  . BREAST LUMPECTOMY Right 2018  . BUNIONECTOMY Right   . CARDIAC CATHETERIZATION    . CATARACT EXTRACTION, BILATERAL    . CHOLECYSTECTOMY    . colon obstruction     lysis of adhesions (no colon or small bowel resections)   . COLONOSCOPY WITH PROPOFOL N/A 02/02/2018   Procedure: COLONOSCOPY WITH PROPOFOL;  Surgeon: Danie Binder, MD;  Location: [facility];  Service: Endoscopy;  Laterality: N/A;  11:30 Am  . full mastectomy Right   . PROLAPSED UTERINE FIBROID LIGATION    . TONSILLECTOMY    . TUBAL LIGATION    . VAGINAL HYSTERECTOMY    . VESICOVAGINAL FISTULA CLOSURE      Social History   Socioeconomic History  . Marital status: Married    Spouse name: Not on file  . Number of children: Not on file  . Years of education: Not on file  . Highest education level: Not on file  Occupational History  . Occupation: retired  Scientific laboratory technician  . Financial resource strain: Not on file  . Food insecurity:    Worry: Not on file    Inability: Not on file  . Transportation needs:    Medical: Not on file    Non-medical: Not on file  Tobacco Use  . Smoking status: Former Smoker    Packs/day: 1.50    Years: 35.00    Pack years: 52.50    Types: Cigarettes    Last attempt to quit: 2000    Years since quitting: 19.2  . Smokeless tobacco: Never Used  Substance and Sexual Activity  . Alcohol use: Yes    Comment: very seldom  . Drug use: No  . Sexual activity: Not Currently    Birth control/protection: Surgical    Comment: hyst  Lifestyle  . Physical activity:    Days per week: Not on file    Minutes per session: Not on file  . Stress: Not on file  Relationships  . Social connections:    Talks on phone: Not on file    Gets together: Not on file    Attends  religious service: Not on file    Active member of club or organization: Not on file    Attends meetings of clubs or organizations: Not on file    Relationship status: Not on file  . Intimate partner violence:    Fear of current or ex partner: Not on file    Emotionally abused: Not on file    Physically abused: Not on  file    Forced sexual activity: Not on file  Other Topics Concern  . Not on file  Social History Narrative  . Not on file     Current Meds  Medication Sig  . ALPRAZolam (XANAX) 0.5 MG tablet Take 0.5 mg by mouth daily as needed for anxiety.   Marland Kitchen anastrozole (ARIMIDEX) 1 MG tablet Take 1 mg by mouth at bedtime.   Marland Kitchen buPROPion (WELLBUTRIN SR) 150 MG 12 hr tablet Take 150 mg by mouth at bedtime.   . diphenhydrAMINE (BENADRYL) 25 MG tablet Take 25 mg by mouth every 6 (six) hours as needed.  . fluticasone (FLONASE) 50 MCG/ACT nasal spray Place 1-2 sprays into both nostrils daily as needed for allergies or rhinitis.   Marland Kitchen ibuprofen (ADVIL,MOTRIN) 200 MG tablet Take 200-400 mg by mouth every 8 (eight) hours as needed (for pain.).  Marland Kitchen Krill Oil 1000 MG CAPS Take 1,000 mg by mouth daily.  Marland Kitchen linaclotide (LINZESS) 72 MCG capsule Take 72 mcg by mouth daily as needed (for constipation).   . Multiple Vitamin (MULTIVITAMIN WITH MINERALS) TABS tablet Take 1 tablet by mouth daily. Investment banker, operational  . naproxen sodium (ALEVE) 220 MG tablet Take 220 mg by mouth 2 (two) times daily as needed (for pain.).  Marland Kitchen omeprazole (PRILOSEC) 40 MG capsule Take 40 mg by mouth daily before breakfast.   . Probiotic Product (RA PROBIOTIC COMPLEX PO) Take 1 capsule by mouth daily.  Marland Kitchen tetrahydrozoline-zinc (VISINE-AC) 0.05-0.25 % ophthalmic solution Place 1-2 drops into both eyes 3 (three) times daily as needed (for irritated/itchy/allergy eyes.).      Review of systems complete and found to be negative unless listed above in HPI    Physical exam Blood pressure 140/78, pulse 73, height 5' 3.5" (1.613 m), weight  210 lb (95.3 kg), SpO2 95 %. General: NAD Neck: No JVD, no thyromegaly or thyroid nodule.  Lungs: Clear to auscultation bilaterally with normal respiratory effort. CV: Nondisplaced PMI. Regular rate and rhythm, normal S1/S2, no S3/S4, no murmur.  No peripheral edema.  No carotid bruit.   Abdomen: Soft, nontender, no distention.  Skin: Intact without lesions or rashes.  Neurologic: Alert and oriented x 3.  Psych: Normal affect. Extremities: No clubbing or cyanosis.  HEENT: Normal.   ECG: Most recent ECG reviewed.   Labs: Lab Results  Component Value Date/Time   K 4.0 01/27/2018 10:27 AM   BUN 14 01/27/2018 10:27 AM   CREATININE 0.81 01/27/2018 10:27 AM   ALT 14 08/18/2017 01:53 PM   HGB 12.8 01/27/2018 10:27 AM     Lipids: No results found for: LDLCALC, LDLDIRECT, CHOL, TRIG, HDL      ASSESSMENT AND PLAN:  1.  Abnormal ECG: She has some mild exertional dyspnea only when walking upstairs but this has remained stable for several years.  She denies exertional chest pain.  She reportedly underwent a normal cardiac catheterization in 2014 in [place].  I will try to obtain this report.  Septal Q waves are often nonspecific findings.  She certainly has a strong family history of heart disease as detailed above.  I will obtain an echocardiogram to evaluate cardiac structure and function.     Disposition: Follow up in 2 months  Signed: Kate Sable, M.D., F.A.C.C.  02/09/2018, 10:24 AM

## 2018-02-09 NOTE — Patient Instructions (Addendum)
Your physician recommends that you schedule a follow-up appointment in: 2 months with [place]  Your physician recommends that you continue on your current medications as directed. Please refer to the Current Medication list given to you today.   If you need a refill on your cardiac medications before your next appointment, please call your pharmacy.    Your physician has requested that you have an echocardiogram. Echocardiography is a painless test that uses sound waves to create images of your heart. It provides your doctor with information about the size and shape of your heart and how well your heart's chambers and valves are working. This procedure takes approximately one hour. There are no restrictions for this procedure.    No lab work ordered today.      Thank you for choosing [facility] !

## 2018-02-15 ENCOUNTER — Other Ambulatory Visit (HOSPITAL_COMMUNITY): Payer: Self-pay | Admitting: *Deleted

## 2018-02-15 DIAGNOSIS — C50911 Malignant neoplasm of unspecified site of right female breast: Secondary | ICD-10-CM

## 2018-02-16 ENCOUNTER — Encounter (HOSPITAL_COMMUNITY): Payer: Self-pay | Admitting: Adult Health

## 2018-02-16 ENCOUNTER — Inpatient Hospital Stay (HOSPITAL_COMMUNITY): Payer: Medicare Other | Attending: Internal Medicine

## 2018-02-16 ENCOUNTER — Encounter (HOSPITAL_COMMUNITY): Payer: Medicare Other | Attending: Internal Medicine | Admitting: Adult Health

## 2018-02-16 VITALS — BP 154/69 | HR 65 | Temp 98.2°F | Resp 18 | Wt 210.6 lb

## 2018-02-16 DIAGNOSIS — Z79811 Long term (current) use of aromatase inhibitors: Secondary | ICD-10-CM

## 2018-02-16 DIAGNOSIS — M8580 Other specified disorders of bone density and structure, unspecified site: Secondary | ICD-10-CM

## 2018-02-16 DIAGNOSIS — M25561 Pain in right knee: Secondary | ICD-10-CM

## 2018-02-16 DIAGNOSIS — M25562 Pain in left knee: Secondary | ICD-10-CM

## 2018-02-16 DIAGNOSIS — Z170 Estrogen receptor positive status [ER+]: Secondary | ICD-10-CM

## 2018-02-16 DIAGNOSIS — C50911 Malignant neoplasm of unspecified site of right female breast: Secondary | ICD-10-CM

## 2018-02-16 DIAGNOSIS — N951 Menopausal and female climacteric states: Secondary | ICD-10-CM | POA: Insufficient documentation

## 2018-02-16 DIAGNOSIS — Z9011 Acquired absence of right breast and nipple: Secondary | ICD-10-CM

## 2018-02-16 LAB — CBC WITH DIFFERENTIAL/PLATELET
Basophils Absolute: 0.1 10*3/uL (ref 0.0–0.1)
Basophils Relative: 1 %
EOS PCT: 3 %
Eosinophils Absolute: 0.2 10*3/uL (ref 0.0–0.7)
HEMATOCRIT: 41.2 % (ref 36.0–46.0)
Hemoglobin: 13 g/dL (ref 12.0–15.0)
LYMPHS PCT: 20 %
Lymphs Abs: 1.4 10*3/uL (ref 0.7–4.0)
MCH: 30 pg (ref 26.0–34.0)
MCHC: 31.6 g/dL (ref 30.0–36.0)
MCV: 95.2 fL (ref 78.0–100.0)
MONO ABS: 0.6 10*3/uL (ref 0.1–1.0)
MONOS PCT: 8 %
Neutro Abs: 4.7 10*3/uL (ref 1.7–7.7)
Neutrophils Relative %: 68 %
Platelets: 218 10*3/uL (ref 150–400)
RBC: 4.33 MIL/uL (ref 3.87–5.11)
RDW: 13.7 % (ref 11.5–15.5)
WBC: 6.9 10*3/uL (ref 4.0–10.5)

## 2018-02-16 LAB — COMPREHENSIVE METABOLIC PANEL
ALT: 18 U/L (ref 14–54)
ANION GAP: 12 (ref 5–15)
AST: 19 U/L (ref 15–41)
Albumin: 3.8 g/dL (ref 3.5–5.0)
Alkaline Phosphatase: 90 U/L (ref 38–126)
BILIRUBIN TOTAL: 0.5 mg/dL (ref 0.3–1.2)
BUN: 13 mg/dL (ref 6–20)
CO2: 24 mmol/L (ref 22–32)
Calcium: 9.3 mg/dL (ref 8.9–10.3)
Chloride: 105 mmol/L (ref 101–111)
Creatinine, Ser: 0.83 mg/dL (ref 0.44–1.00)
Glucose, Bld: 84 mg/dL (ref 65–99)
Potassium: 4.1 mmol/L (ref 3.5–5.1)
Sodium: 141 mmol/L (ref 135–145)
TOTAL PROTEIN: 7.4 g/dL (ref 6.5–8.1)

## 2018-02-16 NOTE — Patient Instructions (Signed)
[place] at [facility] Discharge Instructions  Seen by Mike Craze NP today. Follow-up in 6 months with no labs needed. Mammogram 09/2018   Thank you for choosing [place] at [facility] to provide your oncology and hematology care.  To afford each patient quality time with our provider, please arrive at least 15 minutes before your scheduled appointment time.   If you have a lab appointment with the [facility] please come in thru the  Main Entrance and check in at the main information desk  You need to re-schedule your appointment should you arrive 10 or more minutes late.  We strive to give you quality time with our providers, and arriving late affects you and other patients whose appointments are after yours.  Also, if you no show three or more times for appointments you may be dismissed from the clinic at the providers discretion.     Again, thank you for choosing [facility].  Our hope is that these requests will decrease the amount of time that you wait before being seen by our physicians.       _____________________________________________________________  Should you have questions after your visit to [facility], please contact our office at [phone] between the hours of 8:30 a.m. and 4:30 p.m.  Voicemails left after 4:30 p.m. will not be returned until the following business day.  For prescription refill requests, have your pharmacy contact our office.       Resources For Cancer Patients and their Caregivers ? American Cancer Society: Can assist with transportation, wigs, general needs, runs Look Good Feel Better.        907 155 3448 ? Cancer Care: Provides financial assistance, online support groups, medication/co-pay assistance.  1-800-813-HOPE 212-388-5618) ? [facility] Assists [place] cancer patients and their families through emotional , educational and  financial support.  306-091-6366 ? [place] DSS Where to apply for food stamps, Medicaid and utility assistance. 6674817081 ? [facility]: Transportation to medical appointments. 603-569-8162 ? Social Security Administration: May apply for disability if have a Stage IV cancer. 253-281-3601 201-325-4424 ? [employer], Disability and Transit Services: Assists with nutrition, care and transit needs. [facility] Support Programs:   > Cancer Support Group  2nd Tuesday of the month 1pm-2pm, Journey Room   > Creative Journey  3rd Tuesday of the month 1130am-1pm, Journey Room

## 2018-02-16 NOTE — Progress Notes (Signed)
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 Laverle Patter note on 08/18/17)  Kathy Evans 67 y.o. 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 Patient was previously treated up at [facility] in [place]. She has transferred her care here since she's moved down here.  Patient initially had an abnormal mammogram on 03/18/2017 which demonstrated persistent ill-defined mass in the 3:00 position of the right breast.   MRI of the bilateral breasts performed in June 2018 demonstrated a small stellate enhancing nodule in the 3:00 position of the right breast. The left breast showed complex clusters of cysts at the 2:00 position.   Patient underwent a right partial mastectomy with sentinel lymph node biopsy on 04/30/2017 with path report demonstrating invasive ductal carcinoma grade 2, 5 mm in maximum dimension with some DCIS, 0 out of 2 sentinel lymph nodes were both negative for malignancy, pT1a N0 (sn). Hormone profile ER 96%, PR 66%, HER-2 not amplified with Ki-67 21%.   Patient then received adjuvant radiation with 42.56 gy +10 gy tumor bed boost all in 20 fractions to right whole breast from 06/11/17-07/08/17.   Patient was then started on adjuvant endocrine therapy with Arimidex on 06/03/17.   Patient had genetic testing done and had a negative genetic test.  She stated that every thing has gone well with her treatment so far except that she was not told that her breast would shrink after radiation. She stated she had significant shrinkage. She has been  tolerating Arimidex well except for hot flashes. She also has joint pains in her thighs and hips which have been chronic even prior to her taking Arimidex. She has occasional fatigue. She has history of IBS with alternating diarrhea with constipation. Her appetite is good and she denies any recent weight loss. She denies any chest pain, shortness breath, abdominal pain, recent infections, focal weakness.    INTERVAL HISTORY:  [name] 67 y.o. female returns for routine follow-up for right breast cancer.   Here today with her husband.   Overall, she tells me she has been feeling very well.  Appetite 100%; energy level 75%.  Remains on Arimidex daily.  She is tolerating "pretty well."  Does endorse some hot flashes, as well as arthralgias particularly to her knees.  "But I do not have daily pain."  She does endorse "really bad leg cramps sometimes."  She is not sure if this is related to the Arimidex or not.  Shares with me that she had her colonoscopy done about 2 weeks ago.  She is also scheduled for an echocardiogram, which is a checkup from a recent EKG that was done that required additional evaluation.  She is followed by Dr. Ivonne Andrew with cardiology.  Denies any fever/chills, cough, shortness of breath, chest pain, palpitations, abdominal pain, changes in her bowel or bladder, nausea or vomiting, vaginal bleeding, hematuria, dizziness, headaches, focal bone pain, or sleep problems.  Otherwise, she is largely without complaints today.    REVIEW OF SYSTEMS:  Review of Systems - Oncology Per HPI.  Otherwise 12 point ROS completed and  negative except as stated above.   PAST MEDICAL/SURGICAL HISTORY:  Past Medical History:  Diagnosis Date  . Acid reflux   . Arthritis   . Breast cancer ([place]) 03/2017   right  . Breast disorder    cancer  . Bronchitis   . Depression   . GERD (gastroesophageal reflux disease)   . Hyperlipidemia   . IBS (irritable bowel syndrome)   . Personal  history of radiation therapy 2018   Past Surgical History:  Procedure Laterality Date  . ADENOIDECTOMY    . APPENDECTOMY    . BREAST BIOPSY Left    benign  . BREAST LUMPECTOMY Right 2018  . BUNIONECTOMY Right   . CARDIAC CATHETERIZATION    . CATARACT EXTRACTION, BILATERAL    . CHOLECYSTECTOMY    . colon obstruction     lysis of adhesions (no colon or small bowel resections)   . COLONOSCOPY WITH PROPOFOL N/A 02/02/2018   Procedure: COLONOSCOPY WITH PROPOFOL;  Surgeon: Danie Binder, MD;  Location: [facility];  Service: Endoscopy;  Laterality: N/A;  11:30 Am  . full mastectomy Right   . PROLAPSED UTERINE FIBROID LIGATION    . TONSILLECTOMY    . TUBAL LIGATION    . VAGINAL HYSTERECTOMY    . VESICOVAGINAL FISTULA CLOSURE       SOCIAL HISTORY:  Social History   Socioeconomic History  . Marital status: Married    Spouse name: Not on file  . Number of children: Not on file  . Years of education: Not on file  . Highest education level: Not on file  Occupational History  . Occupation: retired  Scientific laboratory technician  . Financial resource strain: Not on file  . Food insecurity:    Worry: Not on file    Inability: Not on file  . Transportation needs:    Medical: Not on file    Non-medical: Not on file  Tobacco Use  . Smoking status: Former Smoker    Packs/day: 1.50    Years: 35.00    Pack years: 52.50    Types: Cigarettes    Last attempt to quit: 2000    Years since quitting: 19.2  . Smokeless tobacco: Never Used  Substance and Sexual Activity  . Alcohol use: Yes    Comment: very seldom  . Drug use: No  . Sexual activity: Not Currently    Birth control/protection: Surgical    Comment: hyst  Lifestyle  . Physical activity:    Days per week: Not on file    Minutes per session: Not on file  . Stress: Not on file  Relationships  . Social connections:    Talks on phone: Not on file    Gets together: Not on file    Attends religious service: Not on file    Active  member of club or organization: Not on file    Attends meetings of clubs or organizations: Not on file    Relationship status: Not on file  . Intimate partner violence:    Fear of current or ex partner: Not on file    Emotionally abused: Not on file    Physically abused: Not on file    Forced sexual activity: Not on file  Other Topics Concern  . Not on file  Social History Narrative  . Not on file    FAMILY HISTORY:  Family History  Problem Relation Age of Onset  . Diabetes Maternal Grandmother   . Alzheimer's disease Maternal Grandmother   .  Heart disease Father   . Cancer Mother        uterine  . Diabetes Mother   . Heart disease Brother   . Diabetes Brother   . Cancer Sister        lung  . Diabetes Sister   . Heart disease Sister        had stent placed  . Heart disease Brother   . Cancer Sister        uterine  . Diabetes Sister   . High blood pressure Sister   . Colon cancer Neg Hx     CURRENT MEDICATIONS:  Outpatient Encounter Medications as of 02/16/2018  Medication Sig Note  . ALPRAZolam (XANAX) 0.5 MG tablet Take 0.5 mg by mouth daily as needed for anxiety.    Marland Kitchen anastrozole (ARIMIDEX) 1 MG tablet Take 1 mg by mouth at bedtime.    Marland Kitchen buPROPion (WELLBUTRIN SR) 150 MG 12 hr tablet Take 150 mg by mouth at bedtime.    . diphenhydrAMINE (BENADRYL) 25 MG tablet Take 25 mg by mouth every 6 (six) hours as needed.   . fluticasone (FLONASE) 50 MCG/ACT nasal spray Place 1-2 sprays into both nostrils daily as needed for allergies or rhinitis.    Marland Kitchen ibuprofen (ADVIL,MOTRIN) 200 MG tablet Take 200-400 mg by mouth every 8 (eight) hours as needed (for pain.).   Marland Kitchen Krill Oil 1000 MG CAPS Take 1,000 mg by mouth daily. 01/19/2018: On hold due to upcoming procedure.  . linaclotide (LINZESS) 72 MCG capsule Take 72 mcg by mouth daily as needed (for constipation).    . Multiple Vitamin (MULTIVITAMIN WITH MINERALS) TABS tablet Take 1 tablet by mouth daily. Investment banker, operational   . naproxen sodium  (ALEVE) 220 MG tablet Take 220 mg by mouth 2 (two) times daily as needed (for pain.).   Marland Kitchen omeprazole (PRILOSEC) 40 MG capsule Take 40 mg by mouth daily before breakfast.    . Probiotic Product (RA PROBIOTIC COMPLEX PO) Take 1 capsule by mouth daily.   Marland Kitchen tetrahydrozoline-zinc (VISINE-AC) 0.05-0.25 % ophthalmic solution Place 1-2 drops into both eyes 3 (three) times daily as needed (for irritated/itchy/allergy eyes.).    No facility-administered encounter medications on file as of 02/16/2018.     ALLERGIES:  No Known Allergies   PHYSICAL EXAM:  ECOG Performance status:  0-1 - Mildly symptomatic; remains independent   Vitals:   02/16/18 1120  BP: (!) 154/69  Pulse: 65  Resp: 18  Temp: 98.2 F (36.8 C)  SpO2: 96%   Filed Weights   02/16/18 1120  Weight: 210 lb 9.6 oz (95.5 kg)    Physical Exam  Constitutional: She is oriented to person, place, and time and well-developed, well-nourished, and in no distress.  HENT:  Head: Normocephalic.  Mouth/Throat: Oropharynx is clear and moist. No oropharyngeal exudate.  Eyes: Pupils are equal, round, and reactive to light. Conjunctivae are normal. No scleral icterus.  Neck: Normal range of motion. Neck supple.  Cardiovascular: Normal rate and regular rhythm.  Pulmonary/Chest: Effort normal and breath sounds normal. No respiratory distress.    Abdominal: Soft. Bowel sounds are normal. There is no tenderness.  Musculoskeletal: Normal range of motion. She exhibits no edema.  Lymphadenopathy:    She has no cervical adenopathy.       Right: No supraclavicular adenopathy present.       Left: No supraclavicular adenopathy present.  Neurological: She is alert and oriented to person, place, and time. No cranial nerve deficit. Gait normal.  Skin: Skin is warm and dry. No rash noted.  Psychiatric: Mood, memory, affect and judgment normal.  Nursing note and vitals reviewed.    LABORATORY DATA:  I have reviewed the labs as listed.  CBC      Component Value Date/Time   WBC 6.9 02/16/2018 1036   RBC 4.33 02/16/2018 1036   HGB 13.0 02/16/2018 1036   HCT 41.2 02/16/2018 1036   PLT 218 02/16/2018 1036   MCV 95.2 02/16/2018 1036   MCH 30.0 02/16/2018 1036   MCHC 31.6 02/16/2018 1036   RDW 13.7 02/16/2018 1036   LYMPHSABS 1.4 02/16/2018 1036   MONOABS 0.6 02/16/2018 1036   EOSABS 0.2 02/16/2018 1036   BASOSABS 0.1 02/16/2018 1036   CMP Latest Ref Rng & Units 02/16/2018 01/27/2018 08/18/2017  Glucose 65 - 99 mg/dL 84 94 103(H)  BUN 6 - 20 mg/dL _0 Creatinine 0.44 - 1.00 mg/dL 0.83 0.81 0.91  Sodium 135 - 145 mmol/L 141 140 137  Potassium 3.5 - 5.1 mmol/L 4.1 4.0 4.0  Chloride 101 - 111 mmol/L 105 106 104  CO2 22 - 32 mmol/L _1 Calcium 8.9 - 10.3 mg/dL 9.3 9.3 9.0  Total Protein 6.5 - 8.1 g/dL 7.4 - 7.2  Total Bilirubin 0.3 - 1.2 mg/dL 0.5 - 0.4  Alkaline Phos 38 - 126 U/L 90 - 87  AST 15 - 41 U/L 19 - 16  ALT 14 - 54 U/L 18 - 14    PENDING LABS:    DIAGNOSTIC IMAGING:  *The following radiologic images and reports have been reviewed independently and agree with below findings.  Last mammogram: 09/29/17 CLINICAL DATA:  Ductal carcinoma of the right breast, stage I diagnosed in May 2018.Status post lumpectomy with radiation therapy in June 2018.  EXAM: 2D DIGITAL DIAGNOSTIC BILATERAL MAMMOGRAM WITH CAD AND ADJUNCT TOMO  COMPARISON:  04/30/2017 and earlier exams from [place] Category b: There are scattered areas of fibroglandular density.  FINDINGS: Post operative changes are seen in the rightbreast. No suspicious mass, distortion, or microcalcifications are identified to suggest presence of malignancy.  Mammographic images were processed with CAD.  IMPRESSION: No mammographic evidence for malignancy.  RECOMMENDATION: Diagnostic mammogram is suggested in 1 year. (Code:DM-B-01Y)  I have discussed the findings and recommendations with the patient. Results were  also provided in writing at the conclusion of the visit. If applicable, a reminder letter will be sent to the patient regarding the next appointment.  BI-RADS CATEGORY  2: Benign.   Electronically Signed   By: Nolon Nations M.D.   On: 09/29/2017 14:35     PATHOLOGY:  (R) breast lumpectomy surgical path (from [place], [place]): 04/30/17             ASSESSMENT & PLAN:   Stage IA invasive ductal carcinoma of (R) breast; ER+/PR+/HER2-: -Diagnosed and treated in 04/2017 in [place] at [facility]. Treated with (R) breast lumpectomy with SLNB on 04/30/17.  Completed adjuvant radiation from 06/11/17-07/08/17. Started Arimidex on 06/03/17. Reportedly had negative genetic testing.  Relocated to [place] and transferred her oncologic care to [facility] in 08/2017.  -Remains on Arimidex with good tolerance; she has occasional hot flashes and arthralgias, which are manageable at present.  Plan to continue anti-estrogen therapy for 5-10 years.  Briefly discussed Breast Cancer Index (BCI) testing with her today, which would help [place] better understand if she would benefit from extension of  anti-estrogen therapy beyond 5 years as well as her risk of recurrence.  We can certainly order this at a later date if she is interested; will hold off for now.  -Last mammogram 09/29/17 negative; annual diagnostic mammogram due in 09/2018; orders placed today. Reiterated that she will require diagnostic mammogram for 1st 7 years after lumpectomy, then she will transition to screening mammogram annually thereafter. We discussed the differences between these 2 types of mammogram.  -Clinical breast exam performed today and negative.   -Return to cancer center in 6 months; no oncologic reason for labs.    Bone health:  -Last DEXA scan 08/21/17 showed osteopenia with T-score -1.1. -Reinforced that aromatase inhibitor therapies, like Arimidex, can further  decrease bone density over time. She will need repeat DEXA imaging in 08/2019.    -Recommended calcium/vitamin D supplementation, as well as increase weight-bearing exercises as tolerated.  Can consider starting bisphosphonate therapy in the future, if needed.   Health maintenance/Wellness promotion:  -Recommended continued follow-up with PCP for age/gender-appropriate cancer screenings, vaccinations, and wellness exams.  -Encouraged healthy diet and exercise, as tolerated.       Dispo:  -Return to cancer center in 6 months for follow-up; no labs needed.  -Annual mammogram due in 09/2018; orders placed today.    All questions were answered to patient's stated satisfaction. Encouraged patient to call with any new concerns or questions before her next visit to the cancer center and we can certain see her sooner, if needed.       Orders placed this encounter:  Orders Placed This Encounter  Procedures  . MM DIAG BREAST TOMO BILATERAL      Mike Craze, NP [place] [phone]

## 2018-02-17 ENCOUNTER — Ambulatory Visit (HOSPITAL_COMMUNITY)
Admission: RE | Admit: 2018-02-17 | Discharge: 2018-02-17 | Disposition: A | Discharge status: Home / Self Care | Payer: Medicare Other | Source: Ambulatory Visit | Attending: Cardiovascular Disease | Admitting: Cardiovascular Disease

## 2018-02-17 DIAGNOSIS — C50919 Malignant neoplasm of unspecified site of unspecified female breast: Secondary | ICD-10-CM | POA: Insufficient documentation

## 2018-02-17 DIAGNOSIS — K219 Gastro-esophageal reflux disease without esophagitis: Secondary | ICD-10-CM | POA: Insufficient documentation

## 2018-02-17 DIAGNOSIS — Z923 Personal history of irradiation: Secondary | ICD-10-CM | POA: Diagnosis not present

## 2018-02-17 DIAGNOSIS — Z170 Estrogen receptor positive status [ER+]: Secondary | ICD-10-CM | POA: Diagnosis not present

## 2018-02-17 DIAGNOSIS — R9431 Abnormal electrocardiogram [ECG] [EKG]: Secondary | ICD-10-CM | POA: Insufficient documentation

## 2018-02-17 DIAGNOSIS — I082 Rheumatic disorders of both aortic and tricuspid valves: Secondary | ICD-10-CM | POA: Insufficient documentation

## 2018-02-17 DIAGNOSIS — E785 Hyperlipidemia, unspecified: Secondary | ICD-10-CM | POA: Diagnosis not present

## 2018-02-17 NOTE — Progress Notes (Signed)
*  PRELIMINARY RESULTS* Echocardiogram 2D Echocardiogram has been performed.  Kathy Evans 02/17/2018, 11:25 AM

## 2018-02-19 ENCOUNTER — Encounter (HOSPITAL_COMMUNITY): Payer: Self-pay | Admitting: Adult Health

## 2018-02-22 ENCOUNTER — Ambulatory Visit: Payer: Medicare Other | Admitting: Gastroenterology

## 2018-02-22 ENCOUNTER — Encounter: Payer: Self-pay | Admitting: Gastroenterology

## 2018-02-22 VITALS — BP 142/87 | HR 71 | Temp 96.8°F | Ht 63.75 in | Wt 209.0 lb

## 2018-02-22 DIAGNOSIS — K582 Mixed irritable bowel syndrome: Secondary | ICD-10-CM | POA: Diagnosis not present

## 2018-02-22 DIAGNOSIS — K219 Gastro-esophageal reflux disease without esophagitis: Secondary | ICD-10-CM | POA: Diagnosis not present

## 2018-02-22 NOTE — Assessment & Plan Note (Signed)
Doing well on omeprazole. Refills through PCP. Return in 1 year or sooner if needed.

## 2018-02-22 NOTE — Assessment & Plan Note (Signed)
Recently found to be heme positive, with subsequent colonoscopy without concerning findings. Next colonoscopy in 10 years if health permits. Unable to afford Amitiza, so she is using fiber supplementation and doing well with bowel habits. Return in 1 year or sooner if needed.

## 2018-02-22 NOTE — Patient Instructions (Signed)
I am glad you are doing well!  We will see you back in 1 year.  Please call if any concerns in the meantime!  It was a pleasure to see you today. I strive to create trusting relationships with patients to provide genuine, compassionate, and quality care. I value your feedback. If you receive a survey regarding your visit,  I greatly appreciate you taking time to fill this out.   Annitta Needs, PhD, ANP-BC [facility]

## 2018-02-22 NOTE — Progress Notes (Signed)
Primary Care Physician:  Celene Squibb, MD  Primary GI: Dr. Oneida Alar    Chief Complaint  Patient presents with  . Irritable Bowel Syndrome    f/u, doing ok    HPI:   Kathy Evans is a 67 y.o. female presenting today with a history of IBS (alternating constipation and diarrhea). Diagnosed with breast cancer May 2018, underwent right partial mastectomy followed by adjuvant radiation. She was recently heme positive, so she underwent an early interval colonoscopy that revealed diverticula, no polyps. She was started on Amitiza 8 mcg at last visit due to constipation predominant symptoms.   Couldn't afford Amitiza as it was a Tier 3 drug, over 100$ copay. Staying pretty regular without it. Has Linzess to "fall back on". Will take if hasn't gone in 2-3 days. Taking fiber daily. Taking omeprazole daily. Will very seldom have GERD breakthrough and if so, it is due to significant spicy foods or deep-fried foods.   Refills through PCP.      Past Medical History:  Diagnosis Date  . Acid reflux   . Arthritis   . Breast cancer ([place]) 03/2017   right  . Breast disorder    cancer  . Bronchitis   . Depression   . GERD (gastroesophageal reflux disease)   . Hyperlipidemia   . IBS (irritable bowel syndrome)   . Personal history of radiation therapy 2018    Past Surgical History:  Procedure Laterality Date  . ADENOIDECTOMY    . APPENDECTOMY    . BREAST BIOPSY Left    benign  . BREAST LUMPECTOMY Right 2018  . BUNIONECTOMY Right   . CARDIAC CATHETERIZATION    . CATARACT EXTRACTION, BILATERAL    . CHOLECYSTECTOMY    . colon obstruction     lysis of adhesions (no colon or small bowel resections)   . COLONOSCOPY WITH PROPOFOL N/A 02/02/2018   Dr. Oneida Alar: multiple small and large-mouthed diverticula in recto-sigmoid colon, sigmoid, and descending. TI normal. Internal hemorrhoids during retroflexion, small. Moderate external hemorrhoids  . full mastectomy Right   . PROLAPSED UTERINE  FIBROID LIGATION    . TONSILLECTOMY    . TUBAL LIGATION    . VAGINAL HYSTERECTOMY    . VESICOVAGINAL FISTULA CLOSURE      Current Outpatient Medications  Medication Sig Dispense Refill  . ALPRAZolam (XANAX) 0.5 MG tablet Take 0.5 mg by mouth daily as needed for anxiety.     Marland Kitchen anastrozole (ARIMIDEX) 1 MG tablet Take 1 mg by mouth at bedtime.     Marland Kitchen buPROPion (WELLBUTRIN SR) 150 MG 12 hr tablet Take 300 mg by mouth at bedtime.     . diphenhydrAMINE (BENADRYL) 25 MG tablet Take 25 mg by mouth every 6 (six) hours as needed.    . fluticasone (FLONASE) 50 MCG/ACT nasal spray Place 1-2 sprays into both nostrils daily as needed for allergies or rhinitis.     Marland Kitchen ibuprofen (ADVIL,MOTRIN) 200 MG tablet Take 200-400 mg by mouth every 8 (eight) hours as needed (for pain.).    Marland Kitchen Krill Oil 1000 MG CAPS Take 1,000 mg by mouth daily.    Marland Kitchen linaclotide (LINZESS) 72 MCG capsule Take 72 mcg by mouth daily as needed (for constipation).     . Multiple Vitamin (MULTIVITAMIN WITH MINERALS) TABS tablet Take 1 tablet by mouth daily. Investment banker, operational    . naproxen sodium (ALEVE) 220 MG tablet Take 220 mg by mouth 2 (two) times daily as needed (for pain.).    Marland Kitchen  omeprazole (PRILOSEC) 40 MG capsule Take 40 mg by mouth daily before breakfast.     . Probiotic Product (RA PROBIOTIC COMPLEX PO) Take 1 capsule by mouth daily.    Marland Kitchen tetrahydrozoline-zinc (VISINE-AC) 0.05-0.25 % ophthalmic solution Place 1-2 drops into both eyes 3 (three) times daily as needed (for irritated/itchy/allergy eyes.).     No current facility-administered medications for this visit.     Allergies as of 02/22/2018  . (No Known Allergies)    Family History  Problem Relation Age of Onset  . Diabetes Maternal Grandmother   . Alzheimer's disease Maternal Grandmother   . Heart disease Father   . Cancer Mother        uterine  . Diabetes Mother   . Heart disease Brother   . Diabetes Brother   . Cancer Sister        lung  . Diabetes Sister   .  Heart disease Sister        had stent placed  . Heart disease Brother   . Cancer Sister        uterine  . Diabetes Sister   . High blood pressure Sister   . Colon cancer Neg Hx     Social History   Socioeconomic History  . Marital status: Married    Spouse name: Not on file  . Number of children: Not on file  . Years of education: Not on file  . Highest education level: Not on file  Occupational History  . Occupation: retired  Scientific laboratory technician  . Financial resource strain: Not on file  . Food insecurity:    Worry: Not on file    Inability: Not on file  . Transportation needs:    Medical: Not on file    Non-medical: Not on file  Tobacco Use  . Smoking status: Former Smoker    Packs/day: 1.50    Years: 35.00    Pack years: 52.50    Types: Cigarettes    Last attempt to quit: 2000    Years since quitting: 19.2  . Smokeless tobacco: Never Used  Substance and Sexual Activity  . Alcohol use: Yes    Comment: very seldom  . Drug use: No  . Sexual activity: Not Currently    Birth control/protection: Surgical    Comment: hyst  Lifestyle  . Physical activity:    Days per week: Not on file    Minutes per session: Not on file  . Stress: Not on file  Relationships  . Social connections:    Talks on phone: Not on file    Gets together: Not on file    Attends religious service: Not on file    Active member of club or organization: Not on file    Attends meetings of clubs or organizations: Not on file    Relationship status: Not on file  Other Topics Concern  . Not on file  Social History Narrative  . Not on file    Review of Systems: Gen: Denies fever, chills, anorexia. Denies fatigue, weakness, weight loss.  CV: Denies chest pain, palpitations, syncope, peripheral edema, and claudication. Resp: Denies dyspnea at rest, cough, wheezing, coughing up blood, and pleurisy. GI: see HPI  Derm: Denies rash, itching, dry skin Psych: Denies depression, anxiety, memory loss,  confusion. No homicidal or suicidal ideation.  Heme: Denies bruising, bleeding, and enlarged lymph nodes.  Physical Exam: BP (!) 142/87   Pulse 71   Temp (!) 96.8 F (36 C) (Oral)  Ht 5' 3.75" (1.619 m)   Wt 209 lb (94.8 kg)   BMI 36.16 kg/m  General:   Alert and oriented. No distress noted. Pleasant and cooperative.  Head:  Normocephalic and atraumatic. Eyes:  Conjuctiva clear without scleral icterus. Mouth:  Oral mucosa pink and moist.  Abdomen:  +BS, soft, non-tender and non-distended. No rebound or guarding. No HSM or masses noted. Msk:  Symmetrical without gross deformities. Normal posture. Extremities:  Without edema. Neurologic:  Alert and  oriented x4 Psych:  Alert and cooperative. Normal mood and affect.

## 2018-02-23 ENCOUNTER — Ambulatory Visit: Payer: Medicare Other | Admitting: Gastroenterology

## 2018-02-23 NOTE — Progress Notes (Signed)
cc'ed to pcp °

## 2018-02-26 DIAGNOSIS — N341 Nonspecific urethritis: Secondary | ICD-10-CM | POA: Diagnosis not present

## 2018-02-26 DIAGNOSIS — Z23 Encounter for immunization: Secondary | ICD-10-CM | POA: Diagnosis not present

## 2018-02-26 DIAGNOSIS — C50011 Malignant neoplasm of nipple and areola, right female breast: Secondary | ICD-10-CM | POA: Diagnosis not present

## 2018-03-16 DIAGNOSIS — I10 Essential (primary) hypertension: Secondary | ICD-10-CM | POA: Diagnosis not present

## 2018-03-16 DIAGNOSIS — Z1211 Encounter for screening for malignant neoplasm of colon: Secondary | ICD-10-CM | POA: Diagnosis not present

## 2018-03-16 DIAGNOSIS — J019 Acute sinusitis, unspecified: Secondary | ICD-10-CM | POA: Diagnosis not present

## 2018-03-16 DIAGNOSIS — N341 Nonspecific urethritis: Secondary | ICD-10-CM | POA: Diagnosis not present

## 2018-03-16 DIAGNOSIS — Z23 Encounter for immunization: Secondary | ICD-10-CM | POA: Diagnosis not present

## 2018-03-18 DIAGNOSIS — K219 Gastro-esophageal reflux disease without esophagitis: Secondary | ICD-10-CM | POA: Diagnosis not present

## 2018-03-18 DIAGNOSIS — C50911 Malignant neoplasm of unspecified site of right female breast: Secondary | ICD-10-CM | POA: Diagnosis not present

## 2018-03-18 DIAGNOSIS — M8580 Other specified disorders of bone density and structure, unspecified site: Secondary | ICD-10-CM | POA: Diagnosis not present

## 2018-03-22 ENCOUNTER — Telehealth (HOSPITAL_COMMUNITY): Payer: Self-pay | Admitting: Emergency Medicine

## 2018-03-23 NOTE — Telephone Encounter (Signed)
Let's bring her in sometime within the next few weeks to meet Dr. Delton Coombes, so they can discuss alternatives and risks/benefits of continuing or permanently discontinuing anti-estrogen therapy based on her side effects.   Mike Craze, NP [place] [phone]

## 2018-03-23 NOTE — Telephone Encounter (Signed)
Pt was hesitate at first to make the appt because she said that she has already thought long and hard about this decision.  And the fact of having to pay another co-pay.  But however she did make another appt to see Dr Raliegh Ip on 04/16/2018 at 2 pm.

## 2018-04-14 ENCOUNTER — Encounter: Payer: Self-pay | Admitting: Cardiovascular Disease

## 2018-04-14 ENCOUNTER — Ambulatory Visit: Payer: Medicare Other | Admitting: Cardiovascular Disease

## 2018-04-14 VITALS — BP 122/72 | HR 84 | Ht 63.5 in | Wt 208.0 lb

## 2018-04-14 DIAGNOSIS — R9431 Abnormal electrocardiogram [ECG] [EKG]: Secondary | ICD-10-CM
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
## 2018-04-14 NOTE — Patient Instructions (Addendum)
Your physician wants you to follow-up in: as needed  with Dr.Koneswaran      Your physician recommends that you continue on your current medications as directed. Please refer to the Current Medication list given to you today.   No lab work or tests ordered today.      Thank you for choosing [facility] !        

## 2018-04-16 ENCOUNTER — Ambulatory Visit (HOSPITAL_COMMUNITY): Payer: Medicare Other | Admitting: Hematology

## 2018-05-10 DIAGNOSIS — J019 Acute sinusitis, unspecified: Secondary | ICD-10-CM | POA: Diagnosis not present

## 2018-05-10 DIAGNOSIS — M25512 Pain in left shoulder: Secondary | ICD-10-CM | POA: Diagnosis not present

## 2018-06-11 ENCOUNTER — Ambulatory Visit (HOSPITAL_COMMUNITY)
Admission: RE | Admit: 2018-06-11 | Discharge: 2018-06-11 | Disposition: A | Discharge status: Home / Self Care | Payer: Medicare Other | Source: Ambulatory Visit | Attending: Internal Medicine | Admitting: Internal Medicine

## 2018-06-11 ENCOUNTER — Other Ambulatory Visit: Payer: Self-pay | Admitting: Internal Medicine

## 2018-06-11 DIAGNOSIS — M25512 Pain in left shoulder: Secondary | ICD-10-CM | POA: Diagnosis not present

## 2018-06-11 DIAGNOSIS — M19012 Primary osteoarthritis, left shoulder: Secondary | ICD-10-CM | POA: Diagnosis not present

## 2018-07-01 DIAGNOSIS — M542 Cervicalgia: Secondary | ICD-10-CM | POA: Diagnosis not present

## 2018-07-01 DIAGNOSIS — M7542 Impingement syndrome of left shoulder: Secondary | ICD-10-CM | POA: Diagnosis not present

## 2018-07-04 ENCOUNTER — Other Ambulatory Visit: Payer: Self-pay

## 2018-07-04 ENCOUNTER — Emergency Department (HOSPITAL_COMMUNITY)
Admission: EM | Admit: 2018-07-04 | Discharge: 2018-07-04 | Disposition: A | Discharge status: Home / Self Care | Payer: Medicare Other | Attending: Emergency Medicine | Admitting: Emergency Medicine

## 2018-07-04 ENCOUNTER — Emergency Department (HOSPITAL_COMMUNITY): Payer: Medicare Other

## 2018-07-04 ENCOUNTER — Encounter (HOSPITAL_COMMUNITY): Payer: Self-pay | Admitting: *Deleted

## 2018-07-04 DIAGNOSIS — Z87891 Personal history of nicotine dependence: Secondary | ICD-10-CM | POA: Diagnosis not present

## 2018-07-04 DIAGNOSIS — S59901A Unspecified injury of right elbow, initial encounter: Secondary | ICD-10-CM | POA: Diagnosis not present

## 2018-07-04 DIAGNOSIS — S299XXA Unspecified injury of thorax, initial encounter: Secondary | ICD-10-CM | POA: Diagnosis not present

## 2018-07-04 DIAGNOSIS — S59912A Unspecified injury of left forearm, initial encounter: Secondary | ICD-10-CM | POA: Diagnosis present

## 2018-07-04 DIAGNOSIS — Y929 Unspecified place or not applicable: Secondary | ICD-10-CM | POA: Insufficient documentation

## 2018-07-04 DIAGNOSIS — S90812A Abrasion, left foot, initial encounter: Secondary | ICD-10-CM | POA: Diagnosis not present

## 2018-07-04 DIAGNOSIS — S50312A Abrasion of left elbow, initial encounter: Secondary | ICD-10-CM | POA: Diagnosis not present

## 2018-07-04 DIAGNOSIS — Z923 Personal history of irradiation: Secondary | ICD-10-CM | POA: Diagnosis not present

## 2018-07-04 DIAGNOSIS — S8991XA Unspecified injury of right lower leg, initial encounter: Secondary | ICD-10-CM | POA: Diagnosis not present

## 2018-07-04 DIAGNOSIS — Y998 Other external cause status: Secondary | ICD-10-CM | POA: Insufficient documentation

## 2018-07-04 DIAGNOSIS — S80212A Abrasion, left knee, initial encounter: Secondary | ICD-10-CM | POA: Diagnosis not present

## 2018-07-04 DIAGNOSIS — W010XXA Fall on same level from slipping, tripping and stumbling without subsequent striking against object, initial encounter: Secondary | ICD-10-CM | POA: Diagnosis not present

## 2018-07-04 DIAGNOSIS — S59902A Unspecified injury of left elbow, initial encounter: Secondary | ICD-10-CM | POA: Diagnosis not present

## 2018-07-04 DIAGNOSIS — Z853 Personal history of malignant neoplasm of breast: Secondary | ICD-10-CM | POA: Diagnosis not present

## 2018-07-04 DIAGNOSIS — S8992XA Unspecified injury of left lower leg, initial encounter: Secondary | ICD-10-CM | POA: Diagnosis not present

## 2018-07-04 DIAGNOSIS — S50311A Abrasion of right elbow, initial encounter: Secondary | ICD-10-CM | POA: Diagnosis not present

## 2018-07-04 DIAGNOSIS — S80211A Abrasion, right knee, initial encounter: Secondary | ICD-10-CM | POA: Diagnosis not present

## 2018-07-04 DIAGNOSIS — Z79899 Other long term (current) drug therapy: Secondary | ICD-10-CM | POA: Diagnosis not present

## 2018-07-04 DIAGNOSIS — S52045A Nondisplaced fracture of coronoid process of left ulna, initial encounter for closed fracture: Secondary | ICD-10-CM | POA: Diagnosis not present

## 2018-07-04 DIAGNOSIS — Y9301 Activity, walking, marching and hiking: Secondary | ICD-10-CM | POA: Diagnosis not present

## 2018-07-04 DIAGNOSIS — W19XXXA Unspecified fall, initial encounter: Secondary | ICD-10-CM

## 2018-07-04 DIAGNOSIS — S99922A Unspecified injury of left foot, initial encounter: Secondary | ICD-10-CM | POA: Diagnosis not present

## 2018-07-04 DIAGNOSIS — S52042A Displaced fracture of coronoid process of left ulna, initial encounter for closed fracture: Secondary | ICD-10-CM | POA: Diagnosis not present

## 2018-07-04 HISTORY — DX: Impingement syndrome of left shoulder: M75.42

## 2018-07-04 MED ORDER — HYDROCODONE-ACETAMINOPHEN 5-325 MG PO TABS
1.0000 | ORAL_TABLET | Freq: Once | ORAL | Status: AC
  Administered 2018-07-04: 1 via ORAL
  Filled 2018-07-04: qty 1

## 2018-07-04 MED ORDER — METHOCARBAMOL 500 MG PO TABS: 1000.0000 mg | ORAL_TABLET | Freq: Four times a day (QID) | ORAL | 0 refills | Status: DC | PRN

## 2018-07-04 MED ORDER — HYDROCODONE-ACETAMINOPHEN 5-325 MG PO TABS: ORAL_TABLET | ORAL | 0 refills | Status: DC

## 2018-07-04 NOTE — Discharge Instructions (Signed)
Take the prescriptions as directed.  Wash the abraded area(s) gently with soap and water, and pat dry, at least twice a day, and cover with a clean/dry dressing.  Change the dressing whenever it becomes wet or soiled after washing the area with soap and water and patting dry. Apply moist heat or ice to the area(s) of discomfort, for 15 minutes at a time, several times per day for the next few days.  Do not fall asleep on a heating or ice pack. Wear the splint and sling until you are seen in follow up by your Orthopedist. Wear ace wrap to your knee for comfort and support until you are seen in follow up.  Call your regular Orthopedic doctor tomorrow to schedule a follow up appointment in the next 3 days. Return to the Emergency Department immediately if worsening.

## 2018-07-04 NOTE — ED Provider Notes (Signed)
[facility] EMERGENCY DEPARTMENT Provider Note   CSN: [id number] Arrival date & time: 07/04/18  0830     History   Chief Complaint Chief Complaint  Patient presents with  . Fall    HPI Kathy Evans is a 67 y.o. female.   Fall     Pt was seen at 0905. Per pt and her husband, c/o sudden onset and resolution of one episode of slip and fall that occurred PTA. Pt states she fell forward, landing onto her bilat knees and elbows. Pt c/o multiple abrasions and pain to her left foot, bilat knees, bilat elbows and mid-back. Pt absolutely denies hitting her head. Denies LOC, no AMS, no neck pain, no low back pain, no CP/palpitations, no SOB/cough, no abd pain, no N/V/D, no focal motor weakness, no tingling/numbness in extremities.    Td UTD Ortho: [facility] Past Medical History:  Diagnosis Date  . Acid reflux   . Arthritis   . Breast cancer ([place]) 03/2017   right  . Breast disorder    cancer  . Bronchitis   . Depression   . GERD (gastroesophageal reflux disease)   . Hyperlipidemia   . IBS (irritable bowel syndrome)   . Impingement syndrome of left shoulder   . Personal history of radiation therapy 2018    Patient Active Problem List   Diagnosis Date Noted  . GERD (gastroesophageal reflux disease) 02/22/2018  . Heme positive stool 11/26/2017  . Irritable bowel syndrome with both constipation and diarrhea 09/25/2017  . Left lower quadrant abdominal tenderness without rebound tenderness 09/17/2017  . History of breast cancer 09/17/2017  . Vaginal atrophy 09/17/2017  . Vaginal discharge 09/17/2017  . BV (bacterial vaginosis) 09/17/2017  . Ductal carcinoma of breast, estrogen receptor positive, stage 1 ([place]) 08/18/2017    Past Surgical History:  Procedure Laterality Date  . ADENOIDECTOMY    . APPENDECTOMY    . BREAST BIOPSY Left    benign  . BREAST LUMPECTOMY Right 2018  . BUNIONECTOMY Right   . CARDIAC CATHETERIZATION    . CATARACT EXTRACTION, BILATERAL    .  CHOLECYSTECTOMY    . colon obstruction     lysis of adhesions (no colon or small bowel resections)   . COLONOSCOPY WITH PROPOFOL N/A 02/02/2018   Dr. Oneida Alar: multiple small and large-mouthed diverticula in recto-sigmoid colon, sigmoid, and descending. TI normal. Internal hemorrhoids during retroflexion, small. Moderate external hemorrhoids  . full mastectomy Right   . MASTECTOMY, PARTIAL Right   . PROLAPSED UTERINE FIBROID LIGATION    . TONSILLECTOMY    . TUBAL LIGATION    . VAGINAL HYSTERECTOMY    . VESICOVAGINAL FISTULA CLOSURE       OB History    Gravida  3   Para  2   Term  2   Preterm      AB  1   Living  2     SAB  1   TAB      Ectopic      Multiple      Live Births  2            Home Medications    Prior to Admission medications   Medication Sig Start Date End Date Taking? Authorizing Provider  ALPRAZolam Duanne Moron) 0.5 MG tablet Take 0.5 mg by mouth daily as needed for anxiety.     [provider]  buPROPion (WELLBUTRIN SR) 150 MG 12 hr tablet Take 300 mg by mouth at bedtime.  [provider]  diphenhydrAMINE (BENADRYL) 25 MG tablet Take 25 mg by mouth every 6 (six) hours as needed.    [provider]  fluticasone (FLONASE) 50 MCG/ACT nasal spray Place 1-2 sprays into both nostrils daily as needed for allergies or rhinitis.     [provider]  ibuprofen (ADVIL,MOTRIN) 200 MG tablet Take 200-400 mg by mouth every 8 (eight) hours as needed (for pain.).    [provider]  Javier Docker Oil 1000 MG CAPS Take 1,000 mg by mouth daily.    [provider]  levocetirizine (XYZAL) 5 MG tablet Take 5 mg by mouth every evening.  03/04/18   [provider]  linaclotide (LINZESS) 72 MCG capsule Take 72 mcg by mouth daily as needed (for constipation).     [provider]  meloxicam (MOBIC) 7.5 MG tablet Take 1 tablet by mouth daily. 07/01/18   [provider]  Multiple Vitamin (MULTIVITAMIN WITH  MINERALS) TABS tablet Take 1 tablet by mouth daily. Fish farm manager, Historical, MD  naproxen sodium (ALEVE) 220 MG tablet Take 220 mg by mouth 2 (two) times daily as needed (for pain.).    [provider]  omeprazole (PRILOSEC) 40 MG capsule Take 40 mg by mouth daily before breakfast.     [provider]  Probiotic Product (RA PROBIOTIC COMPLEX PO) Take 1 capsule by mouth daily.    [provider]  tetrahydrozoline-zinc (VISINE-AC) 0.05-0.25 % ophthalmic solution Place 1-2 drops into both eyes 3 (three) times daily as needed (for irritated/itchy/allergy eyes.).    [provider]    Family History Family History  Problem Relation Age of Onset  . Diabetes Maternal Grandmother   . Alzheimer's disease Maternal Grandmother   . Heart disease Father   . Cancer Mother        uterine  . Diabetes Mother   . Heart disease Brother   . Diabetes Brother   . Cancer Sister        lung  . Diabetes Sister   . Heart disease Sister        had stent placed  . Heart disease Brother   . Cancer Sister        uterine  . Diabetes Sister   . High blood pressure Sister   . Colon cancer Neg Hx     Social History Social History   Tobacco Use  . Smoking status: Former Smoker    Packs/day: 1.50    Years: 35.00    Pack years: 52.50    Types: Cigarettes    Last attempt to quit: 2000    Years since quitting: 19.6  . Smokeless tobacco: Never Used  Substance Use Topics  . Alcohol use: Yes    Comment: very seldom  . Drug use: No     Allergies   Patient has no known allergies.   Review of Systems Review of Systems ROS: Statement: All systems negative except as marked or noted in the HPI; Constitutional: Negative for fever and chills. ; ; Eyes: Negative for eye pain, redness and discharge. ; ; ENMT: Negative for ear pain, hoarseness, nasal congestion, sinus pressure and sore throat. ; ; Cardiovascular: Negative for chest pain, palpitations, diaphoresis,  dyspnea and peripheral edema. ; ; Respiratory: Negative for cough, wheezing and stridor. ; ; Gastrointestinal: Negative for nausea, vomiting, diarrhea, abdominal pain, blood in stool, hematemesis, jaundice and rectal bleeding. . ; ; Genitourinary: Negative for dysuria, flank pain and hematuria. ; ;  Musculoskeletal: +left foot, bilat knees, bilat elbows, mid-back pain. Negative for neck pain. Negative for swelling and deformity.; ; Skin: +multiple abrasions. Negative for pruritus, rash, blisters, bruising and skin lesion.; ; Neuro: Negative for headache, lightheadedness and neck stiffness. Negative for weakness, altered level of consciousness, altered mental status, extremity weakness, paresthesias, involuntary movement, seizure and syncope.       Physical Exam Updated Vital Signs BP (!) 150/68   Pulse 72   Temp 97.9 F (36.6 C) (Oral)   Resp 18   Ht 5\' 4"  (1.626 m)   Wt 96.6 kg   SpO2 98%   BMI 36.56 kg/m   Physical Exam 0910: Physical examination: Vital signs and O2 SAT: Reviewed; Constitutional: Well developed, Well nourished, Well hydrated, In no acute distress; Head and Face: Normocephalic, Atraumatic; Eyes: EOMI, PERRL, No scleral icterus; ENMT: Mouth and pharynx normal, Left TM normal, Right TM normal, Mucous membranes moist; Neck: Supple, Trachea midline; Spine:  No midline CS, TS, LS tenderness. +mild TTP bilat mid-upper thoracic paraspinal muscles..; Cardiovascular: Regular rate and rhythm, No gallop; Respiratory: Breath sounds clear & equal bilaterally, No wheezes, Normal respiratory effort/excursion; Chest: Nontender, No deformity, Movement normal, No crepitus, No abrasions or ecchymosis.; Abdomen: Soft, Nontender, Nondistended, Normal bowel sounds, No abrasions or ecchymosis.; Genitourinary: No CVA tenderness;.; Extremities: NT bilat shoulders/wrists/hands. NT bilat hips/ankles. NT right foot. +mild generalized TTP left foot with superficial abrasions to dorsal forefoot, no deformity.  .+decreased ROM bilat knees d/t pain. Pt is able to lift extended bilat LE off stretcher, and extend bilat lower legs against resistance.  +medial and lateral ligamentous laxity bilat that pt states is chronic for her.  No patellar or quad tendon step-offs.  NMS intact bilat feet, strong pedal pp. +plantarflexion of bilat feet w/calf squeeze.  No palpable gap bilat Achilles's tendon.  No proximal fibular head tenderness.  No edema, erythema, warmth, ecchymosis or deformity. +TTP bilat patellar areas with superficial abrasions. +mild TTP bilat olecranon process areas with localized superficial abrasions, no deformity. FROM bilat elbows. NMS intact bilat hands, strong radial pulses. Muscles compartments soft.  Otherwise full range of motion major/large joints of bilat UE's and LE's without pain or tenderness to palp, Neurovascularly intact, Pulses normal, No deformity. No edema, Pelvis stable; Neuro: AA&Ox3, GCS 15.  Major CN grossly intact. Speech clear. No gross focal motor or sensory deficits in extremities.; Skin: Color normal, Warm, Dry     ED Treatments / Results  Labs (all labs ordered are listed, but only abnormal results are displayed)   EKG None  Radiology   Procedures Procedures (including critical care time)  Medications Ordered in ED Medications  HYDROcodone-acetaminophen (NORCO/VICODIN) 5-325 MG per tablet 1 tablet (1 tablet Oral Given 07/04/18 1054)     Initial Impression / Assessment and Plan / ED Course  I have reviewed the triage vital signs and the nursing notes.  Pertinent labs & imaging results that were available during my care of the patient were reviewed by me and considered in my medical decision making (see chart for details).  MDM Reviewed: previous chart, nursing note and vitals Interpretation: x-ray   Dg Chest 1 View Result Date: 07/04/2018 CLINICAL DATA:  Fall EXAM: CHEST  1 VIEW COMPARISON:  None. FINDINGS: Cardiac enlargement without heart failure.  Lungs are clear. No acute skeletal abnormality. IMPRESSION: No active disease. Electronically Signed   By: Franchot Gallo M.D.   On: 07/04/2018 10:41   Dg Thoracic Spine 2 View Result Date: 07/04/2018 CLINICAL DATA:  Fall EXAM: THORACIC SPINE 2 VIEWS COMPARISON:  None. FINDINGS: There is no evidence of thoracic spine fracture. Alignment is normal. No other significant bone abnormalities are identified. Mild degenerative spurring in the thoracic spine. IMPRESSION: Negative. Electronically Signed   By: Franchot Gallo M.D.   On: 07/04/2018 10:50   Dg Elbow Complete Left Result Date: 07/04/2018 CLINICAL DATA:  Fall EXAM: LEFT ELBOW - COMPLETE 3+ VIEW COMPARISON:  None. FINDINGS: Normal alignment. Possible nondisplaced fracture of the coronoid process best seen on the oblique view. No effusion. IMPRESSION: Probable nondisplaced fracture coronary process. Electronically Signed   By: Franchot Gallo M.D.   On: 07/04/2018 10:43   Dg Elbow Complete Right Result Date: 07/04/2018 CLINICAL DATA:  Fall EXAM: RIGHT ELBOW - COMPLETE 3+ VIEW COMPARISON:  None. FINDINGS: There is no evidence of fracture, dislocation, or joint effusion. There is no evidence of arthropathy or other focal bone abnormality. Soft tissues are unremarkable. IMPRESSION: Negative. Electronically Signed   By: Franchot Gallo M.D.   On: 07/04/2018 10:43   Dg Knee Complete 4 Views Left Result Date: 07/04/2018 CLINICAL DATA:  Fall EXAM: LEFT KNEE - COMPLETE 4+ VIEW COMPARISON:  None. FINDINGS: Mild tricompartmental degenerative change with mild joint space narrowing and spurring. Negative for fracture or effusion. IMPRESSION: Mild tricompartmental degenerative change.  Negative for fracture. Electronically Signed   By: Franchot Gallo M.D.   On: 07/04/2018 10:46   Dg Knee Complete 4 Views Right Result Date: 07/04/2018 CLINICAL DATA:  Fall EXAM: RIGHT KNEE - COMPLETE 4+ VIEW COMPARISON:  None. FINDINGS: Moderate tricompartmental degenerative change  with diffuse osteophyte formation. Normal alignment. Negative for fracture or effusion. IMPRESSION: Tricompartmental degenerative change.  Negative for fracture. Electronically Signed   By: Franchot Gallo M.D.   On: 07/04/2018 10:47   Dg Foot Complete Left Result Date: 07/04/2018 CLINICAL DATA:  Fall EXAM: LEFT FOOT - COMPLETE 3+ VIEW COMPARISON:  None. FINDINGS: Negative for acute fracture. Mild hallux valgus deformity with mild degenerative change in the first MTP with overlying soft tissue swelling medially. Hypoplastic fourth proximal phalanx. Chronic healed fracture of the fifth metatarsal. Calcaneal spurring. IMPRESSION: Negative for acute fracture. Electronically Signed   By: Franchot Gallo M.D.   On: 07/04/2018 10:45    1145:  XR left elbow with possible fx; will place in long arm splint/sling, f/u Ortho MD. Will apply ACE wrap right knee, per pt request. Pt states she is ready to go home now. Dx and testing d/w pt and family.  Questions answered.  Verb understanding, agreeable to d/c home with outpt f/u.     Final Clinical Impressions(s) / ED Diagnoses   Final diagnoses:  Fall    ED Discharge Orders    None       Francine Graven, DO 07/08/18 0015

## 2018-07-04 NOTE — ED Triage Notes (Signed)
Pt c/o pain to left foot, bilateral knees, right elbow and between shoulder blades after fall this morning. Denies LOC. Abrasion to bilateral knees, left foot and right elbow. Last tetanus shot this year.

## 2018-07-05 ENCOUNTER — Other Ambulatory Visit: Payer: Self-pay

## 2018-07-06 DIAGNOSIS — S8391XA Sprain of unspecified site of right knee, initial encounter: Secondary | ICD-10-CM | POA: Diagnosis not present

## 2018-07-06 DIAGNOSIS — M25562 Pain in left knee: Secondary | ICD-10-CM | POA: Diagnosis not present

## 2018-07-06 DIAGNOSIS — M25561 Pain in right knee: Secondary | ICD-10-CM | POA: Diagnosis not present

## 2018-07-28 ENCOUNTER — Ambulatory Visit (HOSPITAL_COMMUNITY): Payer: Medicare Other | Admitting: Occupational Therapy

## 2018-07-28 ENCOUNTER — Ambulatory Visit (HOSPITAL_COMMUNITY): Payer: Medicare Other

## 2018-08-04 ENCOUNTER — Other Ambulatory Visit: Payer: Self-pay

## 2018-08-04 ENCOUNTER — Ambulatory Visit (HOSPITAL_COMMUNITY): Payer: Medicare Other | Admitting: Physical Therapy

## 2018-08-04 ENCOUNTER — Encounter (HOSPITAL_COMMUNITY): Payer: Self-pay | Admitting: Specialist

## 2018-08-04 ENCOUNTER — Encounter (HOSPITAL_COMMUNITY): Payer: Self-pay | Admitting: Physical Therapy

## 2018-08-04 ENCOUNTER — Ambulatory Visit (HOSPITAL_COMMUNITY): Payer: Medicare Other | Attending: Orthopaedic Surgery | Admitting: Specialist

## 2018-08-04 DIAGNOSIS — M6281 Muscle weakness (generalized): Secondary | ICD-10-CM | POA: Diagnosis not present

## 2018-08-04 DIAGNOSIS — M542 Cervicalgia: Secondary | ICD-10-CM | POA: Insufficient documentation

## 2018-08-04 DIAGNOSIS — M25512 Pain in left shoulder: Secondary | ICD-10-CM | POA: Diagnosis not present

## 2018-08-04 DIAGNOSIS — M25661 Stiffness of right knee, not elsewhere classified: Secondary | ICD-10-CM | POA: Diagnosis not present

## 2018-08-04 DIAGNOSIS — M25561 Pain in right knee: Secondary | ICD-10-CM | POA: Insufficient documentation

## 2018-08-04 DIAGNOSIS — R262 Difficulty in walking, not elsewhere classified: Secondary | ICD-10-CM | POA: Insufficient documentation

## 2018-08-04 NOTE — Therapy (Signed)
[place], [place], [address] Phone: [phone]   Fax:  [phone]  Occupational Therapy Evaluation  Patient Details  Name: Kathy Evans MRN: [id number] Date of Birth: 1951/10/20 Referring Provider: Dr. Ophelia Charter   Encounter Date: 08/04/2018  OT End of Session - 08/04/18 2135    Visit Number  1    Number of Visits  8    Date for OT Re-Evaluation  09/03/18    Authorization Type  UHC Medicare    OT Start Time  1440    OT Stop Time  1520    OT Time Calculation (min)  40 min    Activity Tolerance  Patient tolerated treatment well    Behavior During Therapy  [facility] for tasks assessed/performed       Past Medical History:  Diagnosis Date  . Acid reflux   . Arthritis   . Breast cancer ([place]) 03/2017   right  . Breast disorder    cancer  . Bronchitis   . Depression   . GERD (gastroesophageal reflux disease)   . Hyperlipidemia   . IBS (irritable bowel syndrome)   . Impingement syndrome of left shoulder   . Personal history of radiation therapy 2018    Past Surgical History:  Procedure Laterality Date  . ADENOIDECTOMY    . APPENDECTOMY    . BREAST BIOPSY Left    benign  . BREAST LUMPECTOMY Right 2018  . BUNIONECTOMY Right   . CARDIAC CATHETERIZATION    . CATARACT EXTRACTION, BILATERAL    . CHOLECYSTECTOMY    . colon obstruction     lysis of adhesions (no colon or small bowel resections)   . COLONOSCOPY WITH PROPOFOL N/A 02/02/2018   Dr. Oneida Alar: multiple small and large-mouthed diverticula in recto-sigmoid colon, sigmoid, and descending. TI normal. Internal hemorrhoids during retroflexion, small. Moderate external hemorrhoids  . full mastectomy Right   . MASTECTOMY, PARTIAL Right   . PROLAPSED UTERINE FIBROID LIGATION    . TONSILLECTOMY    . TUBAL LIGATION    . VAGINAL HYSTERECTOMY    . VESICOVAGINAL FISTULA CLOSURE      There were no vitals filed for this visit.  Subjective Assessment -  08/04/18 2129    Subjective   S:  I have been having pain in my left collar bone area.  It is much better now that I have a new pillow.     Pertinent History  Mrs. Mellor reports gradual onset of burning pain in her mid to lateral collar bone region.  Patient reports experiencing symptoms if maintaining same position for too long, and somtimes sleeping at night.  She has been experiencing the burning pain for several months.  Patient reports getting new pillow for bed which has reduced her pain considerably.  Patient has been referred to occupational therapy for evaluation and treatment.      Special Tests  FOTO:  66% independent, patient reports even greater independence.    Patient Stated Goals  Get rid of the pain permanently.      Currently in Pain?  Yes    Pain Score  4     Pain Location  Shoulder    Pain Orientation  Left;Anterior    Pain Descriptors / Indicators  Burning    Pain Type  Acute pain    Pain Radiating Towards  AC joint to mid clavicle    Pain Onset  More than a month ago    Pain Frequency  Intermittent    Aggravating Factors   positioning    Pain Relieving Factors  ice, medication    Effect of Pain on Daily Activities  minimal        [facility] OT Assessment - 08/04/18 2146      Assessment   Medical Diagnosis  Left Shoulder Impingement Syndrome    Referring Provider  Dr. Ophelia Charter    Onset Date/Surgical Date  --   3 months ago   Hand Dominance  Right    Prior Therapy  recieving PT for her knee      Precautions   Precautions  None      Restrictions   Weight Bearing Restrictions  No      Balance Screen   Has the patient fallen in the past 6 months  Yes    How many times?  1    Has the patient had a decrease in activity level because of a fear of falling?   No    Is the patient reluctant to leave their home because of a fear of falling?   No      Home  Environment   Family/patient expects to be discharged to:  Private residence    Lives With  Spouse      Prior  Function   Level of [place]  Retired    Leisure  swimming, sewing, [place], reading       ADL   ADL comments  pain in left shoulder with extended static positioning of left arm.        Written Expression   Dominant Hand  Right      Vision - History   Baseline Vision  No visual deficits      Cognition   Overall Cognitive Status  Within Functional Limits for tasks assessed      Observation/Other Assessments   Focus on Therapeutic Outcomes (FOTO)   66      Posture/Postural Control   Posture Comments  mild kyphotic posture      Sensation   Light Touch  Appears Intact      Coordination   Gross Motor Movements are Fluid and Coordinated  Yes    Fine Motor Movements are Fluid and Coordinated  Yes      Palpation   Palpation comment  severe tenderness, swelling and fascial restrictions noted along SCM origin, scalenes, AC joint, and collar bone - L      AROM   AROM Assessment Site  Shoulder    Right/Left Shoulder  Left    Left Shoulder Flexion  175 Degrees    Left Shoulder ABduction  175 Degrees    Left Shoulder Internal Rotation  50 Degrees    Left Shoulder External Rotation  85 Degrees      Strength   Strength Assessment Site  Shoulder    Right/Left Shoulder  Left    Left Shoulder Flexion  5/5    Left Shoulder ABduction  5/5    Left Shoulder Internal Rotation  5/5    Left Shoulder External Rotation  5/5                      OT Education - 08/04/18 2134    Education Details  sternocleidomastoid stretch, cervical rotation    Person(s) Educated  Patient    Methods  Explanation;Handout    Comprehension  Verbalized understanding;Returned demonstration       OT Short Term Goals -  08/04/18 2143      OT SHORT TERM GOAL #1   Title  Patient will be educated on a HEP to decrease pain in left shoulder region when completing ADLs.    Time  4    Period  Weeks    Status  New    Target Date  09/03/18      OT SHORT TERM GOAL #2    Title  Patient will complete all desired ADLs with 2/10 pain or less in her left shoulder region.    Time  4    Period  Weeks    Status  New      OT SHORT TERM GOAL #3   Title  Patient will improve left shoulder internal rotation and cervical rotation A/ROM to WNL in order to complete ADLs with less pain.      Status  New      OT SHORT TERM GOAL #4   Title  Patient will improve postural alignment of left shoulder and cervical region by 50%    Time  4    Period  Weeks    Status  New               Plan - 08/04/18 2136    Clinical Impression Statement  A:  Patient is a 67 year old female with past medical history significant for cervical radiculopathy, arthritis, breast cancer that has been experiencing burning pain in her left clavicle from her [facility] joint to mid clavicle.  Upon palpation, sternocleidomastoid and scalene musculature is extremely tender with moderate swelling and tightness.  Patient demonstrates WNL A/ROM of left shoulder, except for internal rotation, cervical a/rom is limited by 20 degrees when rotating to the left, shoulder strength is 5/5.  Patient will benefit from skilled OT intervention to decrease pain and restrictions in her left AC joint, sternocleidomastoid, and scalenes in order to complete daily activiites pain free and sleep without awaking due to pain.      Occupational Profile and client history currently impacting functional performance  motivation, extent of symptoms     Occupational performance deficits (Please refer to evaluation for details):  ADL's;IADL's;Rest and Sleep    Rehab Potential  Excellent    OT Frequency  2x / week    OT Duration  4 weeks    OT Treatment/Interventions  Self-care/ADL training;Electrical Stimulation;Therapeutic exercise;Patient/family education;Neuromuscular education;Moist Heat;Energy conservation;Therapeutic activities;Passive range of motion;Manual Therapy;Ultrasound;Cryotherapy    Plan  P:  Review plan of care,  initiate soft tissue mobilization and myofascial release to left ac joint, sternocleidomastoid, and scalenes, begin cervical A/ROM, and postural exercises.      Clinical Decision Making  Limited treatment options, no task modification necessary    Consulted and Agree with Plan of Care  Patient       Patient will benefit from skilled therapeutic intervention in order to improve the following deficits and impairments:  Pain, Increased fascial restrictions, Decreased range of motion, Impaired UE functional use  Visit Diagnosis: Acute pain of left shoulder  Cervicalgia    Problem List Patient Active Problem List   Diagnosis Date Noted  . GERD (gastroesophageal reflux disease) 02/22/2018  . Heme positive stool 11/26/2017  . Irritable bowel syndrome with both constipation and diarrhea 09/25/2017  . Left lower quadrant abdominal tenderness without rebound tenderness 09/17/2017  . History of breast cancer 09/17/2017  . Vaginal atrophy 09/17/2017  . Vaginal discharge 09/17/2017  . BV (bacterial vaginosis) 09/17/2017  . Ductal carcinoma of breast, estrogen receptor  positive, stage 1 ([place]) 08/18/2017    Vangie Bicker, [place], OTR/L [phone]  08/04/2018, 9:51 PM  [place] [address] [place], [place], [address] Phone: [phone]   Fax:  [phone]  Name: Kathy Evans MRN: [id number] Date of Birth: Apr 16, 1951

## 2018-08-04 NOTE — Patient Instructions (Addendum)
  YOU MAY DO THIS STRETCH STANDING OR SITTING. KEEPING YOUR SHOULDERS SQUARE AND DOWN, TILT YOUR HEAD TO ONE SIDE AND TURN YOUR FACE UP. IF YOU WANT TO DEEPEN THE STRETCH, BRING YOUR HAND UP, AND APPLY GENTLE PRESSURE TO THE SIDE OF YOUR HEAD. HOLD FOR 10-15 SECONDS, THEN RELAX AND REPEAT ON THE OTHER SIDE.  COMPLETE 2-3 TIMES PER DAY.Active Neck Rotation    With head in a comfortable position and chin gently tucked in, rotate head to the right. Hold ____ seconds. Repeat to the left. Repeat ____ times. Do ____ sessions per day.  http://gt2.exer.us/11   Copyright  VHI. All rights reserved.

## 2018-08-04 NOTE — Patient Instructions (Addendum)
Strengthening: Quadriceps Set    Tighten muscles on top of thighs by pushing knees down into surface. Hold ____ seconds. Repeat ____ times per set. Do ____ sets per session. Do ____ sessions per day.  http://orth.exer.us/602   Copyright  VHI. All rights reserved.  Knee Extension (Sitting)    Place __0__ pound weight on left ankle and straighten knee fully, lower slowly. Repeat __10__ times per set. Do __1__ sets per session. Do _3___ sessions per day.  http://orth.exer.us/732   Copyright  VHI. All rights reserved.  Single Leg - Eyes Open    Holding support, lift right leg while maintaining balance over other leg. Progress to removing hands from support surface for longer periods of time. Hold__10-60__ seconds. Repeat __5__ times per session. Do __1__ sessions per day.  Copyright  VHI. All rights reserved.  Strengthening: Straight Leg Raise (Phase 1)    Tighten muscles on front of right thigh, then lift leg 15____ inches from surface, keeping knee locked.  Repeat _10___ times per set. Do __1__ sets per session. Do ___2_ sessions per day.  http://orth.exer.us/614   Copyright  VHI. All rights reserved.  Self-Mobilization: Knee Flexion (Prone)    Bring left heel toward buttocks as close as possible. Hold __3__ seconds. Relax. Repeat __10__ times per set. Do __1__ sets per session. Do 2____ sessions per day.  http://orth.exer.us/596   Copyright  VHI. All rights reserved.

## 2018-08-04 NOTE — Therapy (Signed)
Kathy Evans, [place], [address] Phone: [phone]   Fax:  [phone]  Physical Therapy Evaluation  Patient Details  Name: Kathy Evans MRN: [id number] Date of Birth: Jan 09, 1951 Referring Provider: Ophelia Charter   Encounter Date: 08/04/2018  PT End of Session - 08/04/18 1435    Visit Number  1    Number of Visits  8    Date for PT Re-Evaluation  09/03/18    Authorization Type  [employer]HC MEdicare    PT Start Time  1350    PT Stop Time  1430    PT Time Calculation (min)  40 min    Activity Tolerance  Patient tolerated treatment well       Past Medical History:  Diagnosis Date  . Acid reflux   . Arthritis   . Breast cancer ([place]) 03/2017   right  . Breast disorder    cancer  . Bronchitis   . Depression   . GERD (gastroesophageal reflux disease)   . Hyperlipidemia   . IBS (irritable bowel syndrome)   . Impingement syndrome of left shoulder   . Personal history of radiation therapy 2018    Past Surgical History:  Procedure Laterality Date  . ADENOIDECTOMY    . APPENDECTOMY    . BREAST BIOPSY Left    benign  . BREAST LUMPECTOMY Right 2018  . BUNIONECTOMY Right   . CARDIAC CATHETERIZATION    . CATARACT EXTRACTION, BILATERAL    . CHOLECYSTECTOMY    . colon obstruction     lysis of adhesions (no colon or small bowel resections)   . COLONOSCOPY WITH PROPOFOL N/A 02/02/2018   Dr. Oneida Alar: multiple small and large-mouthed diverticula in recto-sigmoid colon, sigmoid, and descending. TI normal. Internal hemorrhoids during retroflexion, small. Moderate external hemorrhoids  . full mastectomy Right   . MASTECTOMY, PARTIAL Right   . PROLAPSED UTERINE FIBROID LIGATION    . TONSILLECTOMY    . TUBAL LIGATION    . VAGINAL HYSTERECTOMY    . VESICOVAGINAL FISTULA CLOSURE      There were no vitals filed for this visit.   Subjective Assessment - 08/04/18 1411    Subjective  Kathy Evans states that she was running in flip  flops on 07/05/2018 and fell injurying her shoulder and right knee.  At this time she is having pain when she goes to pick up her foot off the floor.  Her pain is in her knee cap.     Pertinent History  breast cancer, OA     Limitations  House hold activities;Standing;Walking    How long can you sit comfortably?  no problem     How long can you stand comfortably?  15 minutes     How long can you walk comfortably?  less than five minutes     Pain Onset  1 to 4 weeks ago         [facility] PT Assessment - 08/04/18 0001      Assessment   Medical Diagnosis  Fall with RT knee injury     Referring Provider  Ophelia Charter    Onset Date/Surgical Date  07/05/18    Next MD Visit  if needed     Prior Therapy  none      Precautions   Precautions  None      Restrictions   Weight Bearing Restrictions  No      Balance Screen   Has the patient fallen in the  past 6 months  Yes    How many times?  1    Has the patient had a decrease in activity level because of a fear of falling?   No    Is the patient reluctant to leave their home because of a fear of falling?   No      Home Environment   Living Environment  Private residence    Type of [place] to enter    Entrance Stairs-Number of Steps  4    Entrance Stairs-Rails  Right    [place]  One level      Prior Function   Level of [place]  Retired    Leisure  Office manager   Overall Cognitive Status  Within Functional Limits for tasks assessed      Observation/Other Assessments   Focus on Therapeutic Outcomes (FOTO)   53      Functional Tests   Functional tests  Single leg stance;Sit to Stand      Single Leg Stance   Comments  Rt 6 " ; LT 9      Sit to Stand   Comments  --      ROM / Strength   AROM / PROM / Strength  AROM;Strength      AROM   AROM Assessment Site  Knee    Right/Left Knee  Right    Right Knee Extension  0    Right Knee Flexion  95       Strength   Strength Assessment Site  Hip;Knee;Ankle    Right/Left Hip  Right;Left    Right Hip Flexion  2+/5    Right Hip Extension  4-/5    Right Hip ABduction  3-/5    Left Hip Extension  5/5    Right/Left Knee  Right;Left    Right Knee Flexion  3-/5    Right Knee Extension  3/5    Left Knee Flexion  5/5    Left Knee Extension  5/5    Right/Left Ankle  Right;Left    Right Ankle Dorsiflexion  5/5    Left Ankle Dorsiflexion  5/5                Objective measurements completed on examination: See above findings.      [facility] Adult PT Treatment/Exercise - 08/04/18 0001      Exercises   Exercises  Knee/Hip      Knee/Hip Exercises: Standing   SLS  x 5 B       Knee/Hip Exercises: Seated   Long Arc Quad  Right;10 reps      Knee/Hip Exercises: Supine   Quad Sets  Right;10 reps    Heel Slides  10 reps    Straight Leg Raises  5 reps    Straight Leg Raises Limitations  knee bent       Knee/Hip Exercises: Prone   Hamstring Curl  10 reps             PT Education - 08/04/18 1435    Education Details  HEP    Person(s) Educated  Patient    Methods  Explanation;Demonstration;Handout    Comprehension  Verbalized understanding;Returned demonstration       PT Short Term Goals - 08/04/18 1631      PT SHORT TERM GOAL #1   Title  PT pain will be no  greater than a 5/10 to allow pt to stand for 30 minutes in comfort to be able to complete self grooming tasks.     Time  2    Period  Weeks    Status  New    Target Date  08/18/18      PT SHORT TERM GOAL #2   Title  PT to be able to walk for 30 minutes to complete short shopping trips.    Time  2    Period  Weeks    Status  New      PT SHORT TERM GOAL #3   Title  Pt strength to be increased 1/2 grade to allow pt to feel confident walking inside the home without an assistive device.     Time  2    Period  Weeks    Status  New        PT Long Term Goals - 08/04/18 1633      PT LONG TERM GOAL #1   Title  Pt  pain level to be no greater than a 2/10 to be able to sleep without taking medication     Time  4    Period  Weeks    Status  New    Target Date  09/01/18      PT LONG TERM GOAL #2   Title  Pt to be able to stand/walk for at least an hour to allow pt to complete household duties.     Time  4    Period  Weeks    Status  New      PT LONG TERM GOAL #3   Title  PT  strength in her right knee to have increased one grade to allow pt to go up and down steps in a reciprocal manner.     Time  4    Period  Weeks    Status  New      PT LONG TERM GOAL #4   Title  Pt ROM in Rt knee to be to 120 to allow pt to squat to pick items off of the floor     Time  4    Period  Weeks    Status  New      PT LONG TERM GOAL #5   Title  PT to feel confident walking outside without  an assistive device     Time  4    Period  Weeks    Status  New      Additional Long Term Goals   Additional Long Term Goals  Yes      PT LONG TERM GOAL #6   Title  PT to be able to single leg stance for 30 seconds to be confident walking on uneven ground.    Time  4    Period  Weeks    Status  New             Plan - 08/04/18 1621    Clinical Impression Statement  Kathy Evans is a 66 yo female who fell while she was running in flip flops injuring her right knee.  She states that she had injured her knee in the past but had swam and completed other exercises to a point where it was no longer causing her pain.  Since the accident she has had significant pain and has felt as if her knee cap wants to "pop" out.  She has currently been referred to skilled physical therapy for  evaluation and treatment.  Evaluation demonstrates increased pain, decreased ROM, decreased strength, decreased balance, decreased activity tolerance and antalgic gait.  Kathy Evans will benefit from skilled PT to address these issues and maximize her funtional ability.     History and Personal Factors relevant to plan of care:  past torn meniscus of her  right knee     Clinical Decision Making  Moderate    Rehab Potential  Good    PT Frequency  2x / week    PT Duration  4 weeks    PT Treatment/Interventions  ADLs/Self Care Home Management;Therapeutic exercise;Balance training;Stair training;Gait training;Functional mobility training;Therapeutic activities;Neuromuscular re-education;Patient/family education;Manual techniques;Dry needling;Aquatic Therapy    PT Next Visit Plan  Begin rocker board, standing and supine terminal extension, standing knee flextion, lunging     PT Home Exercise Plan  Single leg stance, LAQ, Q-set, bent knee raise and prone knee flexion.     Consulted and Agree with Plan of Care  Patient       Patient will benefit from skilled therapeutic intervention in order to improve the following deficits and impairments:  Abnormal gait, Decreased activity tolerance, Decreased balance, Decreased range of motion, Decreased mobility, Decreased strength, Difficulty walking, Pain  Visit Diagnosis: Acute pain of right knee - Plan: PT plan of care cert/re-cert  Stiffness of right knee, not elsewhere classified - Plan: PT plan of care cert/re-cert  Muscle weakness (generalized) - Plan: PT plan of care cert/re-cert  Difficulty walking - Plan: PT plan of care cert/re-cert     Problem List Patient Active Problem List   Diagnosis Date Noted  . GERD (gastroesophageal reflux disease) 02/22/2018  . Heme positive stool 11/26/2017  . Irritable bowel syndrome with both constipation and diarrhea 09/25/2017  . Left lower quadrant abdominal tenderness without rebound tenderness 09/17/2017  . History of breast cancer 09/17/2017  . Vaginal atrophy 09/17/2017  . Vaginal discharge 09/17/2017  . BV (bacterial vaginosis) 09/17/2017  . Ductal carcinoma of breast, estrogen receptor positive, stage 1 ([place]) 08/18/2017   Rayetta Humphrey, PT CLT [phone] 08/04/2018, 4:42 PM  [place] [address] [place], [place], [address] Phone: [phone]   Fax:  [phone]  Name: Kathy Evans MRN: [id number] Date of Birth: Jan 14, 1951

## 2018-08-19 ENCOUNTER — Ambulatory Visit (HOSPITAL_COMMUNITY): Payer: Medicare Other | Admitting: Hematology

## 2018-09-15 ENCOUNTER — Telehealth (HOSPITAL_COMMUNITY): Payer: Self-pay | Admitting: Occupational Therapy

## 2018-09-15 NOTE — Telephone Encounter (Signed)
Attempted to call pt regarding therapy. Pt was seen for PT and OT evaluations in September, however never scheduled appointments. Left message for pt to return call.    Guadelupe Sabin, OTR/L  [phone] 09/15/2018

## 2018-09-21 ENCOUNTER — Encounter (HOSPITAL_COMMUNITY): Payer: Self-pay | Admitting: Occupational Therapy

## 2018-09-21 NOTE — Therapy (Signed)
[place], [place], [address] Phone: [phone]   Fax:  [phone]  Patient Details  Name: Seeley Southgate MRN: [id number] Date of Birth: 10-Jun-1951 Referring Provider:  No ref. provider found  Encounter Date: 09/21/2018   OCCUPATIONAL THERAPY DISCHARGE SUMMARY  Visits from Start of Care: 1  Current functional level related to goals / functional outcomes: Unknown. Pt has not returned for therapy services since evaluation on 08/04/18.    Remaining deficits: Unknown   Education / Equipment: HEP at evaluation  Plan: Patient agrees to discharge.  Patient goals were not met. Patient is being discharged due to not returning since the last visit.  ?????       Guadelupe Sabin, OTR/L  [phone] 09/21/2018, 10:51 AM  [place], [place], [address] Phone: [phone]   Fax:  [phone]

## 2018-09-22 DIAGNOSIS — I10 Essential (primary) hypertension: Secondary | ICD-10-CM | POA: Diagnosis not present

## 2018-09-22 DIAGNOSIS — E782 Mixed hyperlipidemia: Secondary | ICD-10-CM | POA: Diagnosis not present

## 2018-09-24 DIAGNOSIS — Z23 Encounter for immunization: Secondary | ICD-10-CM | POA: Diagnosis not present

## 2018-09-24 DIAGNOSIS — K219 Gastro-esophageal reflux disease without esophagitis: Secondary | ICD-10-CM | POA: Diagnosis not present

## 2018-09-24 DIAGNOSIS — C50911 Malignant neoplasm of unspecified site of right female breast: Secondary | ICD-10-CM | POA: Diagnosis not present

## 2018-09-24 DIAGNOSIS — Z0000 Encounter for general adult medical examination without abnormal findings: Secondary | ICD-10-CM | POA: Diagnosis not present

## 2018-09-24 DIAGNOSIS — M8580 Other specified disorders of bone density and structure, unspecified site: Secondary | ICD-10-CM | POA: Diagnosis not present

## 2018-10-05 ENCOUNTER — Other Ambulatory Visit (HOSPITAL_COMMUNITY): Payer: Self-pay | Admitting: Oncology

## 2018-10-05 ENCOUNTER — Other Ambulatory Visit (HOSPITAL_COMMUNITY): Payer: Self-pay

## 2018-10-05 ENCOUNTER — Other Ambulatory Visit (HOSPITAL_COMMUNITY): Payer: Self-pay | Admitting: Hematology

## 2018-10-05 ENCOUNTER — Ambulatory Visit (HOSPITAL_COMMUNITY)
Admission: RE | Admit: 2018-10-05 | Discharge: 2018-10-05 | Disposition: A | Discharge status: Home / Self Care | Payer: Medicare Other | Source: Ambulatory Visit | Attending: Adult Health | Admitting: Adult Health

## 2018-10-05 ENCOUNTER — Ambulatory Visit (HOSPITAL_COMMUNITY): Payer: Medicare Other

## 2018-10-05 ENCOUNTER — Other Ambulatory Visit: Payer: Self-pay | Admitting: Oncology

## 2018-10-05 ENCOUNTER — Ambulatory Visit (HOSPITAL_COMMUNITY)
Admission: RE | Admit: 2018-10-05 | Discharge: 2018-10-05 | Disposition: A | Discharge status: Home / Self Care | Payer: Medicare Other | Source: Ambulatory Visit | Attending: Oncology | Admitting: Oncology

## 2018-10-05 DIAGNOSIS — N6489 Other specified disorders of breast: Secondary | ICD-10-CM | POA: Diagnosis not present

## 2018-10-05 DIAGNOSIS — Z9889 Other specified postprocedural states: Secondary | ICD-10-CM

## 2018-10-05 DIAGNOSIS — R928 Other abnormal and inconclusive findings on diagnostic imaging of breast: Secondary | ICD-10-CM | POA: Diagnosis not present

## 2018-10-05 DIAGNOSIS — C50911 Malignant neoplasm of unspecified site of right female breast: Secondary | ICD-10-CM | POA: Insufficient documentation

## 2018-10-05 DIAGNOSIS — N6310 Unspecified lump in the right breast, unspecified quadrant: Secondary | ICD-10-CM

## 2018-10-05 DIAGNOSIS — Z170 Estrogen receptor positive status [ER+]: Secondary | ICD-10-CM | POA: Diagnosis not present

## 2018-10-06 ENCOUNTER — Inpatient Hospital Stay (HOSPITAL_COMMUNITY): Payer: Medicare Other | Attending: Internal Medicine | Admitting: Internal Medicine

## 2018-10-06 ENCOUNTER — Other Ambulatory Visit: Payer: Self-pay

## 2018-10-06 ENCOUNTER — Encounter (HOSPITAL_COMMUNITY): Payer: Self-pay | Admitting: Internal Medicine

## 2018-10-06 VITALS — BP 156/70 | HR 68 | Temp 97.7°F | Resp 20 | Wt 218.6 lb

## 2018-10-06 DIAGNOSIS — M8580 Other specified disorders of bone density and structure, unspecified site: Secondary | ICD-10-CM | POA: Insufficient documentation

## 2018-10-06 DIAGNOSIS — M25569 Pain in unspecified knee: Secondary | ICD-10-CM | POA: Diagnosis not present

## 2018-10-06 DIAGNOSIS — Z170 Estrogen receptor positive status [ER+]: Secondary | ICD-10-CM | POA: Diagnosis not present

## 2018-10-06 DIAGNOSIS — C50911 Malignant neoplasm of unspecified site of right female breast: Secondary | ICD-10-CM

## 2018-10-06 NOTE — Progress Notes (Signed)
Diagnosis No diagnosis found.  Staging Cancer Staging No matching staging information was found for the patient.  Assessment and Plan: 1.  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   -Diagnosed and treated in 04/2017 in [place] at [facility] at [place]. Treated with (R) breast lumpectomy with SLNB on 04/30/17.  Completed adjuvant radiation from 06/11/17-07/08/17. Started Arimidex on 06/03/17. Reportedly had negative genetic testing.  Relocated to [place] and transferred her oncologic care to [facility] in 08/2017.   Pt reports she discontinued Arimidex on her on due to side effects.  She had been previously recommended for therapy for 5 to 10 years.    Pt had bilateral diagnostic mammogram and right breast USN done 10/05/2018 that was reviewed and showed  IMPRESSION: Indeterminate mass in the right breast in the region of prior lumpectomy, possibly representing debris within the seroma however given the round and solid appearance biopsy is warranted.  RECOMMENDATION: Ultrasound-guided biopsy of the mass in the right breast is recommended. This is being scheduled for the patient.  Pt is set up for biopsy on Tuesday 10/12/2018.  Pt will RTC to go over biopsy results.    2.  Right breast mass.  No palpable abnormalities noted on exam.  She is set up for biopsy on Tuesday 10/12/2018.  Pt will RTC to go over biopsy results.    3.  Osteopenia.  This was noted on Dexa done 08/21/2017.  Pt was recommended for calcium and Vitamin D previously.  Repeat Dexa in 08/2019.    4.  Knee pain.  Pt reports this occurred after a fall.  She denies any other joint pain.    5.  Health maintenance.  Follow-up with GI as recommended.    25 minutes spent with review of records, counseling and coordination of care.    Current Status:  Pt is seen today for follow-up.  She is here to go over mammogram and USN.  Pt is  scheduled for breast biopsy.     No history exists.  PATHOLOGY:  (R) breast lumpectomy surgical path (from [place], [place]): 04/30/17             Problem List Patient Active Problem List   Diagnosis Date Noted  . GERD (gastroesophageal reflux disease) [K21.9] 02/22/2018  . Heme positive stool [R19.5] 11/26/2017  . Irritable bowel syndrome with both constipation and diarrhea [K58.2] 09/25/2017  . Left lower quadrant abdominal tenderness without rebound tenderness [R10.814] 09/17/2017  . History of breast cancer [Z85.3] 09/17/2017  . Vaginal atrophy [N95.2] 09/17/2017  . Vaginal discharge [N89.8] 09/17/2017  . BV (bacterial vaginosis) [N76.0, B96.89] 09/17/2017  . Ductal carcinoma of breast, estrogen receptor positive, stage 1 (HCC) [C50.919, Z17.0] 08/18/2017    Past Medical History Past Medical History:  Diagnosis Date  . Acid reflux   . Arthritis   . Breast cancer ([place]) 03/2017   right  . Breast disorder    cancer  . Bronchitis   . Depression   . GERD (gastroesophageal reflux disease)   . Hyperlipidemia   . IBS (irritable bowel syndrome)   . Impingement syndrome of left shoulder   . Personal history of radiation therapy 2018    Past Surgical History Past Surgical History:  Procedure Laterality Date  . ADENOIDECTOMY    . APPENDECTOMY    . BREAST BIOPSY Left    benign  . BREAST LUMPECTOMY Right 2018  . BUNIONECTOMY Right   .  CARDIAC CATHETERIZATION    . CATARACT EXTRACTION, BILATERAL    . CHOLECYSTECTOMY    . colon obstruction     lysis of adhesions (no colon or small bowel resections)   . COLONOSCOPY WITH PROPOFOL N/A 02/02/2018   Dr. Oneida Alar: multiple small and large-mouthed diverticula in recto-sigmoid colon, sigmoid, and descending. TI normal. Internal hemorrhoids during retroflexion, small. Moderate external hemorrhoids  . full mastectomy Right   . MASTECTOMY, PARTIAL Right   . PROLAPSED UTERINE FIBROID LIGATION    . TONSILLECTOMY     . TUBAL LIGATION    . VAGINAL HYSTERECTOMY    . VESICOVAGINAL FISTULA CLOSURE      Family History Family History  Problem Relation Age of Onset  . Diabetes Maternal Grandmother   . Alzheimer's disease Maternal Grandmother   . Heart disease Father   . Cancer Mother        uterine  . Diabetes Mother   . Heart disease Brother   . Diabetes Brother   . Cancer Sister        lung  . Diabetes Sister   . Heart disease Sister        had stent placed  . Heart disease Brother   . Cancer Sister        uterine  . Diabetes Sister   . High blood pressure Sister   . Colon cancer Neg Hx      Social History  reports that she quit smoking about 19 years ago. Her smoking use included cigarettes. She has a 52.50 pack-year smoking history. She has never used smokeless tobacco. She reports that she drinks alcohol. She reports that she does not use drugs.  Medications  Current Outpatient Medications:  .  ALPRAZolam (XANAX) 0.5 MG tablet, Take 0.5 mg by mouth daily as needed for anxiety. , Disp: , Rfl:  .  buPROPion (WELLBUTRIN SR) 150 MG 12 hr tablet, Take 300 mg by mouth at bedtime. , Disp: , Rfl:  .  diclofenac sodium (VOLTAREN) 1 % GEL, APPLY 2 TO 4 GRAMS TO AFFECTED AREA EVERY 6 HOURS AS NEEDED FOR PAIN, Disp: , Rfl: 2 .  diphenhydrAMINE (BENADRYL) 25 MG tablet, Take 25 mg by mouth every 6 (six) hours as needed., Disp: , Rfl:  .  ezetimibe (ZETIA) 10 MG tablet, Take 10 mg by mouth daily. , Disp: , Rfl:  .  fluticasone (FLONASE) 50 MCG/ACT nasal spray, Place 1-2 sprays into both nostrils daily as needed for allergies or rhinitis. , Disp: , Rfl:  .  ibuprofen (ADVIL,MOTRIN) 200 MG tablet, Take 200-400 mg by mouth every 8 (eight) hours as needed (for pain.)., Disp: , Rfl:  .  levocetirizine (XYZAL) 5 MG tablet, Take 5 mg by mouth every evening. , Disp: , Rfl:  .  linaclotide (LINZESS) 72 MCG capsule, Take 72 mcg by mouth daily as needed (for constipation). , Disp: , Rfl:  .  omeprazole  (PRILOSEC) 40 MG capsule, Take 40 mg by mouth daily before breakfast. , Disp: , Rfl:  .  tetrahydrozoline-zinc (VISINE-AC) 0.05-0.25 % ophthalmic solution, Place 1-2 drops into both eyes 3 (three) times daily as needed (for irritated/itchy/allergy eyes.)., Disp: , Rfl:   Allergies Patient has no known allergies.  Review of Systems Review of Systems - Oncology ROS negative other than right knee pain.     Physical Exam  Vitals Wt Readings from Last 3 Encounters:  10/06/18 218 lb 9.6 oz (99.2 kg)  07/04/18 213 lb (96.6 kg)  04/14/18 208 lb (  94.3 kg)   Temp Readings from Last 3 Encounters:  10/06/18 97.7 F (36.5 C) (Oral)  07/04/18 98 F (36.7 C) (Oral)  02/22/18 (!) 96.8 F (36 C) (Oral)   BP Readings from Last 3 Encounters:  10/06/18 (!) 156/70  07/04/18 (!) 148/72  04/14/18 122/72   Pulse Readings from Last 3 Encounters:  10/06/18 68  07/04/18 68  04/14/18 84   Constitutional: Well-developed, well-nourished, and in no distress.   HENT: Head: Normocephalic and atraumatic.  Mouth/Throat: No oropharyngeal exudate. Mucosa moist. Eyes: Pupils are equal, round, and reactive to light. Conjunctivae are normal. No scleral icterus.  Neck: Normal range of motion. Neck supple. No JVD present.  Cardiovascular: Normal rate, regular rhythm and normal heart sounds.  Exam reveals no gallop and no friction rub.   No murmur heard. Pulmonary/Chest: Effort normal and breath sounds normal. No respiratory distress. No wheezes.No rales.  Abdominal: Soft. Bowel sounds are normal. No distension. There is no tenderness. There is no guarding.  Musculoskeletal: No edema or tenderness.  Lymphadenopathy: No cervical, axillary or supraclavicular adenopathy.  Neurological: Alert and oriented to person, place, and time. No cranial nerve deficit.  Skin: Skin is warm and dry. No rash noted. No erythema. No pallor.  Psychiatric: Affect and judgment normal.  Breast exam:  Chaperone present.  Evidence of  right lumpectomy.  No dominant masses palpable bilaterally.    Labs No visits with results within 3 Day(s) from this visit.  Latest known visit with results is:  Appointment on 02/16/2018  Component Date Value Ref Range Status  . WBC 02/16/2018 6.9  4.0 - 10.5 K/uL Final  . RBC 02/16/2018 4.33  3.87 - 5.11 MIL/uL Final  . Hemoglobin 02/16/2018 13.0  12.0 - 15.0 g/dL Final  . HCT 02/16/2018 41.2  36.0 - 46.0 % Final  . MCV 02/16/2018 95.2  78.0 - 100.0 fL Final  . MCH 02/16/2018 30.0  26.0 - 34.0 pg Final  . MCHC 02/16/2018 31.6  30.0 - 36.0 g/dL Final  . RDW 02/16/2018 13.7  11.5 - 15.5 % Final  . Platelets 02/16/2018 218  150 - 400 K/uL Final  . Neutrophils Relative % 02/16/2018 68  % Final  . Neutro Abs 02/16/2018 4.7  1.7 - 7.7 K/uL Final  . Lymphocytes Relative 02/16/2018 20  % Final  . Lymphs Abs 02/16/2018 1.4  0.7 - 4.0 K/uL Final  . Monocytes Relative 02/16/2018 8  % Final  . Monocytes Absolute 02/16/2018 0.6  0.1 - 1.0 K/uL Final  . Eosinophils Relative 02/16/2018 3  % Final  . Eosinophils Absolute 02/16/2018 0.2  0.0 - 0.7 K/uL Final  . Basophils Relative 02/16/2018 1  % Final  . Basophils Absolute 02/16/2018 0.1  0.0 - 0.1 K/uL Final   Performed at [facility], [address], [place], [place] [address]  . Sodium 02/16/2018 141  135 - 145 mmol/L Final  . Potassium 02/16/2018 4.1  3.5 - 5.1 mmol/L Final  . Chloride 02/16/2018 105  101 - 111 mmol/L Final  . CO2 02/16/2018 24  22 - 32 mmol/L Final  . Glucose, Bld 02/16/2018 84  65 - 99 mg/dL Final  . BUN 02/16/2018 13  6 - 20 mg/dL Final  . Creatinine, Ser 02/16/2018 0.83  0.44 - 1.00 mg/dL Final  . Calcium 02/16/2018 9.3  8.9 - 10.3 mg/dL Final  . Total Protein 02/16/2018 7.4  6.5 - 8.1 g/dL Final  . Albumin 02/16/2018 3.8  3.5 - 5.0 g/dL Final  .  AST 02/16/2018 19  15 - 41 U/L Final  . ALT 02/16/2018 18  14 - 54 U/L Final  . Alkaline Phosphatase 02/16/2018 90  38 - 126 U/L Final  . Total Bilirubin 02/16/2018 0.5  0.3  - 1.2 mg/dL Final  . GFR calc non Af Amer 02/16/2018 >60  >60 mL/min Final  . GFR calc Af Amer 02/16/2018 >60  >60 mL/min Final   Comment: (NOTE) The eGFR has been calculated using the CKD EPI equation. This calculation has not been validated in all clinical situations. eGFR's persistently <60 mL/min signify possible Chronic Kidney Disease.   Georgiann Hahn gap 02/16/2018 12  5 - 15 Final   Performed at [facility], [address] [place], [place] [address]     Pathology No orders of the defined types were placed in this encounter.      Zoila Shutter MD

## 2018-10-06 NOTE — Patient Instructions (Addendum)
[place] at [facility] Discharge Instructions  Today you saw Dr. Walden Field  Copy of mammogram given to you today.  We will see what the biopsy shows and then you will come in and we will go over the biopsy results.   Thank you for choosing [place] at [facility] to provide your oncology and hematology care.  To afford each patient quality time with our provider, please arrive at least 15 minutes before your scheduled appointment time.   If you have a lab appointment with the [place] please come in thru the  Main Entrance and check in at the main information desk  You need to re-schedule your appointment should you arrive 10 or more minutes late.  We strive to give you quality time with our providers, and arriving late affects you and other patients whose appointments are after yours.  Also, if you no show three or more times for appointments you may be dismissed from the clinic at the providers discretion.     Again, thank you for choosing [facility].  Our hope is that these requests will decrease the amount of time that you wait before being seen by our physicians.       _____________________________________________________________  Should you have questions after your visit to [facility], please contact our office at [phone] between the hours of 8:00 a.m. and 4:30 p.m.  Voicemails left after 4:00 p.m. will not be returned until the following business day.  For prescription refill requests, have your pharmacy contact our office and allow 72 hours.    Cancer Center Support Programs:   > Cancer Support Group  2nd Tuesday of the month 1pm-2pm, Journey Room

## 2018-10-12 ENCOUNTER — Other Ambulatory Visit (HOSPITAL_COMMUNITY): Payer: Self-pay | Admitting: Hematology

## 2018-10-12 ENCOUNTER — Ambulatory Visit (HOSPITAL_COMMUNITY)
Admission: RE | Admit: 2018-10-12 | Discharge: 2018-10-12 | Disposition: A | Discharge status: Home / Self Care | Payer: Medicare Other | Source: Ambulatory Visit | Attending: Hematology | Admitting: Hematology

## 2018-10-12 ENCOUNTER — Encounter (HOSPITAL_COMMUNITY): Payer: Self-pay

## 2018-10-12 DIAGNOSIS — R928 Other abnormal and inconclusive findings on diagnostic imaging of breast: Secondary | ICD-10-CM

## 2018-10-12 DIAGNOSIS — N641 Fat necrosis of breast: Secondary | ICD-10-CM | POA: Diagnosis not present

## 2018-10-12 DIAGNOSIS — Z9889 Other specified postprocedural states: Secondary | ICD-10-CM | POA: Diagnosis not present

## 2018-10-12 DIAGNOSIS — N6322 Unspecified lump in the left breast, upper inner quadrant: Secondary | ICD-10-CM | POA: Diagnosis not present

## 2018-10-12 MED ORDER — LIDOCAINE HCL (PF) 2 % IJ SOLN
INTRAMUSCULAR | Status: AC
  Administered 2018-10-12: 10 mL
  Filled 2018-10-12: qty 10

## 2018-10-12 MED ORDER — LIDOCAINE-EPINEPHRINE (PF) 1 %-1:200000 IJ SOLN
INTRAMUSCULAR | Status: AC
  Administered 2018-10-12: 08:00:00
  Filled 2018-10-12: qty 30

## 2018-10-20 ENCOUNTER — Other Ambulatory Visit: Payer: Self-pay

## 2018-10-20 ENCOUNTER — Inpatient Hospital Stay (HOSPITAL_COMMUNITY): Payer: Medicare Other | Attending: Internal Medicine | Admitting: Internal Medicine

## 2018-10-20 VITALS — BP 141/65 | HR 72 | Temp 97.4°F | Resp 16 | Wt 222.0 lb

## 2018-10-20 DIAGNOSIS — M8580 Other specified disorders of bone density and structure, unspecified site: Secondary | ICD-10-CM | POA: Diagnosis not present

## 2018-10-20 DIAGNOSIS — Z79811 Long term (current) use of aromatase inhibitors: Secondary | ICD-10-CM | POA: Insufficient documentation

## 2018-10-20 DIAGNOSIS — Z170 Estrogen receptor positive status [ER+]: Secondary | ICD-10-CM | POA: Insufficient documentation

## 2018-10-20 DIAGNOSIS — M25569 Pain in unspecified knee: Secondary | ICD-10-CM | POA: Diagnosis not present

## 2018-10-20 DIAGNOSIS — Z87891 Personal history of nicotine dependence: Secondary | ICD-10-CM | POA: Diagnosis not present

## 2018-10-20 DIAGNOSIS — N63 Unspecified lump in unspecified breast: Secondary | ICD-10-CM | POA: Diagnosis not present

## 2018-10-20 DIAGNOSIS — Z9011 Acquired absence of right breast and nipple: Secondary | ICD-10-CM | POA: Diagnosis not present

## 2018-10-20 DIAGNOSIS — Z79899 Other long term (current) drug therapy: Secondary | ICD-10-CM | POA: Diagnosis not present

## 2018-10-20 DIAGNOSIS — Z90710 Acquired absence of both cervix and uterus: Secondary | ICD-10-CM | POA: Diagnosis not present

## 2018-10-20 DIAGNOSIS — C50911 Malignant neoplasm of unspecified site of right female breast: Secondary | ICD-10-CM | POA: Insufficient documentation

## 2018-10-20 MED ORDER — LETROZOLE 2.5 MG PO TABS: 2.5000 mg | ORAL_TABLET | Freq: Every day | ORAL | 4 refills | Status: DC

## 2018-10-20 NOTE — Progress Notes (Signed)
Diagnosis No diagnosis found.  Staging Cancer Staging No matching staging information was found for the patient.  Assessment and Plan:   1.  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   -Diagnosed and treated in 04/2017 in [place] at [facility] [place]. Treated with (R) breast lumpectomy with SLNB on 04/30/17.  Completed adjuvant radiation from 06/11/17-07/08/17. Started Arimidex on 06/03/17. Reportedly had negative genetic testing.  Relocated to [place] and transferred her oncologic care to [facility] in 08/2017.   Pt discontinued Arimidex on her on due to side effects.  She had been previously recommended for therapy for 5 to 10 years.    Pt had bilateral diagnostic mammogram and right breast USN done 10/05/2018 that showed  IMPRESSION: Indeterminate mass in the right breast in the region of prior lumpectomy, possibly representing debris within the seroma however given the round and solid appearance biopsy is warranted.  RECOMMENDATION: Ultrasound-guided biopsy of the mass in the right breast is recommended. This is being scheduled for the patient.  Pt had right breast biopsy done 10/12/2018 with pathology returning as  Breast, right, needle core biopsy, 3:15 - SCANT BENIGN ADIPOSE TISSUE WITH FIBROSIS. - FRAGMENTS OF NECROSIS. - NO MALIGNANCY IDENTIFIED.  I have discussed with pt benefits of adjuvant therapy with AI and if she is not tolerating Arimidex, she is recommended for Femara 2.5 mg po daily with initial goal of 5 years that may extend to 10 years if tolerating therapy.  Side effects of the medication were reviewed and she was provided written information.  Rx for Femara # 90 with 4 refills sent to pharmacy.  Pt should notify the office if any problems once medications begins.  She will RTC in 04/2019 for follow-up.    2.  Right breast mass.  Pt had biopsy done on 10/12/2018 that was  benign adipose tissue with no malignancy identified.  Pt is recommended for repeat imaging in 09/2019.  She will be seen for follow-up in 04/2019.    3.  Osteopenia.  This was noted on Dexa done 08/21/2017.  Pt was recommended for calcium and Vitamin D previously.  Repeat Dexa in 08/2019.    4.  Knee pain.  Pt reports this occurred after a fall.  She denies any other joint pain.    5.  Health maintenance.  Follow-up with GI as recommended.    25 minutes spent with review of records, counseling and coordination of care.    Current Status:  Pt is seen today for follow-up.  She is here to go over biopsy results.  She denies any problems after procedure.     No history exists.  PATHOLOGY:  (R) breast lumpectomy surgical path (from [place], [place]): 04/30/17       Problem List Patient Active Problem List   Diagnosis Date Noted  . GERD (gastroesophageal reflux disease) [K21.9] 02/22/2018  . Heme positive stool [R19.5] 11/26/2017  . Irritable bowel syndrome with both constipation and diarrhea [K58.2] 09/25/2017  . Left lower quadrant abdominal tenderness without rebound tenderness [R10.814] 09/17/2017  . History of breast cancer [Z85.3] 09/17/2017  . Vaginal atrophy [N95.2] 09/17/2017  . Vaginal discharge [N89.8] 09/17/2017  . BV (bacterial vaginosis) [N76.0, B96.89] 09/17/2017  . Ductal carcinoma of breast, estrogen receptor positive, stage 1 (HCC) [C50.919, Z17.0] 08/18/2017    Past Medical History Past Medical History:  Diagnosis Date  . Acid reflux   . Arthritis   .  Breast cancer ([place]) 03/2017   right  . Breast disorder    cancer  . Bronchitis   . Depression   . GERD (gastroesophageal reflux disease)   . Hyperlipidemia   . IBS (irritable bowel syndrome)   . Impingement syndrome of left shoulder   . Personal history of radiation therapy 2018    Past Surgical History Past Surgical History:  Procedure Laterality Date  . ADENOIDECTOMY    . APPENDECTOMY     . BREAST BIOPSY Left    benign  . BREAST LUMPECTOMY Right 2018  . BUNIONECTOMY Right   . CARDIAC CATHETERIZATION    . CATARACT EXTRACTION, BILATERAL    . CHOLECYSTECTOMY    . colon obstruction     lysis of adhesions (no colon or small bowel resections)   . COLONOSCOPY WITH PROPOFOL N/A 02/02/2018   Dr. Oneida Alar: multiple small and large-mouthed diverticula in recto-sigmoid colon, sigmoid, and descending. TI normal. Internal hemorrhoids during retroflexion, small. Moderate external hemorrhoids  . full mastectomy Right   . MASTECTOMY, PARTIAL Right   . PROLAPSED UTERINE FIBROID LIGATION    . TONSILLECTOMY    . TUBAL LIGATION    . VAGINAL HYSTERECTOMY    . VESICOVAGINAL FISTULA CLOSURE      Family History Family History  Problem Relation Age of Onset  . Diabetes Maternal Grandmother   . Alzheimer's disease Maternal Grandmother   . Heart disease Father   . Cancer Mother        uterine  . Diabetes Mother   . Heart disease Brother   . Diabetes Brother   . Cancer Sister        lung  . Diabetes Sister   . Heart disease Sister        had stent placed  . Heart disease Brother   . Cancer Sister        uterine  . Diabetes Sister   . High blood pressure Sister   . Colon cancer Neg Hx      Social History  reports that she quit smoking about 19 years ago. Her smoking use included cigarettes. She has a 52.50 pack-year smoking history. She has never used smokeless tobacco. She reports that she drinks alcohol. She reports that she does not use drugs.  Medications  Current Outpatient Medications:  .  ALPRAZolam (XANAX) 0.5 MG tablet, Take 0.5 mg by mouth daily as needed for anxiety. , Disp: , Rfl:  .  buPROPion (WELLBUTRIN SR) 150 MG 12 hr tablet, Take 300 mg by mouth at bedtime. , Disp: , Rfl:  .  diclofenac sodium (VOLTAREN) 1 % GEL, APPLY 2 TO 4 GRAMS TO AFFECTED AREA EVERY 6 HOURS AS NEEDED FOR PAIN, Disp: , Rfl: 2 .  diphenhydrAMINE (BENADRYL) 25 MG tablet, Take 25 mg by mouth  every 6 (six) hours as needed., Disp: , Rfl:  .  ezetimibe (ZETIA) 10 MG tablet, Take 10 mg by mouth daily. , Disp: , Rfl:  .  fluticasone (FLONASE) 50 MCG/ACT nasal spray, Place 1-2 sprays into both nostrils daily as needed for allergies or rhinitis. , Disp: , Rfl:  .  ibuprofen (ADVIL,MOTRIN) 200 MG tablet, Take 200-400 mg by mouth every 8 (eight) hours as needed (for pain.)., Disp: , Rfl:  .  levocetirizine (XYZAL) 5 MG tablet, Take 5 mg by mouth every evening. , Disp: , Rfl:  .  linaclotide (LINZESS) 72 MCG capsule, Take 72 mcg by mouth daily as needed (for constipation). , Disp: , Rfl:  .  meloxicam (MOBIC) 7.5 MG tablet, Take 7.5 mg by mouth 2 (two) times daily., Disp: , Rfl:  .  omeprazole (PRILOSEC) 40 MG capsule, Take 40 mg by mouth daily before breakfast. , Disp: , Rfl:  .  tetrahydrozoline-zinc (VISINE-AC) 0.05-0.25 % ophthalmic solution, Place 1-2 drops into both eyes 3 (three) times daily as needed (for irritated/itchy/allergy eyes.)., Disp: , Rfl:  .  letrozole (FEMARA) 2.5 MG tablet, Take 1 tablet (2.5 mg total) by mouth daily., Disp: 90 tablet, Rfl: 4  Allergies Patient has no known allergies.  Review of Systems Review of Systems - Oncology ROS negative other than knee pain   Physical Exam  Vitals Wt Readings from Last 3 Encounters:  10/20/18 222 lb (100.7 kg)  10/06/18 218 lb 9.6 oz (99.2 kg)  07/04/18 213 lb (96.6 kg)   Temp Readings from Last 3 Encounters:  10/20/18 (!) 97.4 F (36.3 C) (Oral)  10/12/18 98.2 F (36.8 C) (Oral)  10/06/18 97.7 F (36.5 C) (Oral)   BP Readings from Last 3 Encounters:  10/20/18 (!) 141/65  10/12/18 (!) 150/69  10/06/18 (!) 156/70   Pulse Readings from Last 3 Encounters:  10/20/18 72  10/12/18 60  10/06/18 68   Constitutional: Well-developed, well-nourished, and in no distress.   HENT: Head: Normocephalic and atraumatic.  Mouth/Throat: No oropharyngeal exudate. Mucosa moist. Eyes: Pupils are equal, round, and reactive to  light. Conjunctivae are normal. No scleral icterus.  Neck: Normal range of motion. Neck supple. No JVD present.  Cardiovascular: Normal rate, regular rhythm and normal heart sounds.  Exam reveals no gallop and no friction rub.   No murmur heard. Pulmonary/Chest: Effort normal and breath sounds normal. No respiratory distress. No wheezes.No rales.  Abdominal: Soft. Bowel sounds are normal. No distension. There is no tenderness. There is no guarding.  Musculoskeletal: No edema or tenderness.  Lymphadenopathy: No cervical, axillary or supraclavicular adenopathy.  Neurological: Alert and oriented to person, place, and time. No cranial nerve deficit.  Skin: Skin is warm and dry. No rash noted. No erythema. No pallor.  Psychiatric: Affect and judgment normal.   Labs No visits with results within 3 Day(s) from this visit.  Latest known visit with results is:  Appointment on 02/16/2018  Component Date Value Ref Range Status  . WBC 02/16/2018 6.9  4.0 - 10.5 K/uL Final  . RBC 02/16/2018 4.33  3.87 - 5.11 MIL/uL Final  . Hemoglobin 02/16/2018 13.0  12.0 - 15.0 g/dL Final  . HCT 02/16/2018 41.2  36.0 - 46.0 % Final  . MCV 02/16/2018 95.2  78.0 - 100.0 fL Final  . MCH 02/16/2018 30.0  26.0 - 34.0 pg Final  . MCHC 02/16/2018 31.6  30.0 - 36.0 g/dL Final  . RDW 02/16/2018 13.7  11.5 - 15.5 % Final  . Platelets 02/16/2018 218  150 - 400 K/uL Final  . Neutrophils Relative % 02/16/2018 68  % Final  . Neutro Abs 02/16/2018 4.7  1.7 - 7.7 K/uL Final  . Lymphocytes Relative 02/16/2018 20  % Final  . Lymphs Abs 02/16/2018 1.4  0.7 - 4.0 K/uL Final  . Monocytes Relative 02/16/2018 8  % Final  . Monocytes Absolute 02/16/2018 0.6  0.1 - 1.0 K/uL Final  . Eosinophils Relative 02/16/2018 3  % Final  . Eosinophils Absolute 02/16/2018 0.2  0.0 - 0.7 K/uL Final  . Basophils Relative 02/16/2018 1  % Final  . Basophils Absolute 02/16/2018 0.1  0.0 - 0.1 K/uL Final   Performed at [facility], [address], [place], [place] [address]  . Sodium 02/16/2018 141  135 - 145 mmol/L Final  . Potassium 02/16/2018 4.1  3.5 - 5.1 mmol/L Final  . Chloride 02/16/2018 105  101 - 111 mmol/L Final  . CO2 02/16/2018 24  22 - 32 mmol/L Final  . Glucose, Bld 02/16/2018 84  65 - 99 mg/dL Final  . BUN 02/16/2018 13  6 - 20 mg/dL Final  . Creatinine, Ser 02/16/2018 0.83  0.44 - 1.00 mg/dL Final  . Calcium 02/16/2018 9.3  8.9 - 10.3 mg/dL Final  . Total Protein 02/16/2018 7.4  6.5 - 8.1 g/dL Final  . Albumin 02/16/2018 3.8  3.5 - 5.0 g/dL Final  . AST 02/16/2018 19  15 - 41 U/L Final  . ALT 02/16/2018 18  14 - 54 U/L Final  . Alkaline Phosphatase 02/16/2018 90  38 - 126 U/L Final  . Total Bilirubin 02/16/2018 0.5  0.3 - 1.2 mg/dL Final  . GFR calc non Af Amer 02/16/2018 >60  >60 mL/min Final  . GFR calc Af Amer 02/16/2018 >60  >60 mL/min Final   Comment: (NOTE) The eGFR has been calculated using the CKD EPI equation. This calculation has not been validated in all clinical situations. eGFR's persistently <60 mL/min signify possible Chronic Kidney Disease.   Georgiann Hahn gap 02/16/2018 12  5 - 15 Final   Performed at [facility], [address] [place], [place] [address]     Pathology No orders of the defined types were placed in this encounter.      Zoila Shutter MD

## 2018-10-26 ENCOUNTER — Other Ambulatory Visit (HOSPITAL_COMMUNITY): Payer: Self-pay | Admitting: *Deleted

## 2018-10-28 ENCOUNTER — Other Ambulatory Visit (HOSPITAL_COMMUNITY): Payer: Self-pay | Admitting: *Deleted

## 2018-10-28 MED ORDER — LETROZOLE 2.5 MG PO TABS: 2.5000 mg | ORAL_TABLET | Freq: Every day | ORAL | 4 refills | Status: DC

## 2018-12-17 ENCOUNTER — Encounter (HOSPITAL_COMMUNITY): Payer: Self-pay | Admitting: Physical Therapy

## 2018-12-17 NOTE — Therapy (Signed)
[facility] [place], [place], [address] Phone: [phone]   Fax:  [phone]  Physical Therapy Treatment  Patient Details  Name: Kathy Evans MRN: [id number] Date of Birth: 1951/02/11 Referring Provider (PT): Dr. Ophelia Charter   Encounter Date: 12/17/2018  PHYSICAL THERAPY DISCHARGE SUMMARY  Visits from Start of Care: 1  Current functional level related to goals / functional outcomes: unknown   Remaining deficits: unknown   Education / Equipment: HEP Plan: Patient agrees to discharge.  Patient goals were not met. Patient is being discharged due to not returning since the last visit.  ?????   Rayetta Humphrey, PT CLT [phone]    Past Medical History:  Diagnosis Date  . Acid reflux   . Arthritis   . Breast cancer ([facility]) 03/2017   right  . Breast disorder    cancer  . Bronchitis   . Depression   . GERD (gastroesophageal reflux disease)   . Hyperlipidemia   . IBS (irritable bowel syndrome)   . Impingement syndrome of left shoulder   . Personal history of radiation therapy 2018    Past Surgical History:  Procedure Laterality Date  . ADENOIDECTOMY    . APPENDECTOMY    . BREAST BIOPSY Left    benign  . BREAST LUMPECTOMY Right 2018  . BUNIONECTOMY Right   . CARDIAC CATHETERIZATION    . CATARACT EXTRACTION, BILATERAL    . CHOLECYSTECTOMY    . colon obstruction     lysis of adhesions (no colon or small bowel resections)   . COLONOSCOPY WITH PROPOFOL N/A 02/02/2018   Dr. Oneida Alar: multiple small and large-mouthed diverticula in recto-sigmoid colon, sigmoid, and descending. TI normal. Internal hemorrhoids during retroflexion, small. Moderate external hemorrhoids  . full mastectomy Right   . MASTECTOMY, PARTIAL Right   . PROLAPSED UTERINE FIBROID LIGATION    . TONSILLECTOMY    . TUBAL LIGATION    . VAGINAL HYSTERECTOMY    . VESICOVAGINAL FISTULA CLOSURE      There were no vitals filed for this  visit.                              PT Short Term Goals - 08/04/18 1631      PT SHORT TERM GOAL #1   Title  PT pain will be no greater than a 5/10 to allow pt to stand for 30 minutes in comfort to be able to complete self grooming tasks.     Time  2    Period  Weeks    Status  New    [employer] Date  08/18/18      PT SHORT TERM GOAL #2   Title  PT to be able to walk for 30 minutes to complete short shopping trips.    Time  2    Period  Weeks    Status  New      PT SHORT TERM GOAL #3   Title  Pt strength to be increased 1/2 grade to allow pt to feel confident walking inside the home without an assistive device.     Time  2    Period  Weeks    Status  New        PT Long Term Goals - 08/04/18 1633      PT LONG TERM GOAL #1   Title  Pt pain level to be no greater than a 2/10 to be able  to sleep without taking medication     Time  4    Period  Weeks    Status  New    [employer] Date  09/01/18      PT LONG TERM GOAL #2   Title  Pt to be able to stand/walk for at least an hour to allow pt to complete household duties.     Time  4    Period  Weeks    Status  New      PT LONG TERM GOAL #3   Title  PT  strength in her right knee to have increased one grade to allow pt to go up and down steps in a reciprocal manner.     Time  4    Period  Weeks    Status  New      PT LONG TERM GOAL #4   Title  Pt ROM in Rt knee to be to 120 to allow pt to squat to pick items off of the floor     Time  4    Period  Weeks    Status  New      PT LONG TERM GOAL #5   Title  PT to feel confident walking outside without  an assistive device     Time  4    Period  Weeks    Status  New      Additional Long Term Goals   Additional Long Term Goals  Yes      PT LONG TERM GOAL #6   Title  PT to be able to single leg stance for 30 seconds to be confident walking on uneven ground.    Time  4    Period  Weeks    Status  New              Patient will benefit from  skilled therapeutic intervention in order to improve the following deficits and impairments:     Visit Diagnosis: No diagnosis found.     Problem List Patient Active Problem List   Diagnosis Date Noted  . GERD (gastroesophageal reflux disease) 02/22/2018  . Heme positive stool 11/26/2017  . Irritable bowel syndrome with both constipation and diarrhea 09/25/2017  . Left lower quadrant abdominal tenderness without rebound tenderness 09/17/2017  . History of breast cancer 09/17/2017  . Vaginal atrophy 09/17/2017  . Vaginal discharge 09/17/2017  . BV (bacterial vaginosis) 09/17/2017  . Ductal carcinoma of breast, estrogen receptor positive, stage 1 ([facility]) 08/18/2017    RUSSELL,CINDY 12/17/2018, 4:21 PM  [facility] [address] [place], [place], [address] Phone: [phone]   Fax:  [phone]  Name: Kathy Evans MRN: [id number] Date of Birth: [birth year]

## 2018-12-24 DIAGNOSIS — Z961 Presence of intraocular lens: Secondary | ICD-10-CM | POA: Diagnosis not present

## 2019-01-05 DIAGNOSIS — N341 Nonspecific urethritis: Secondary | ICD-10-CM | POA: Diagnosis not present

## 2019-01-31 ENCOUNTER — Encounter: Payer: Self-pay | Admitting: Gastroenterology

## 2019-02-19 DIAGNOSIS — C50011 Malignant neoplasm of nipple and areola, right female breast: Secondary | ICD-10-CM | POA: Diagnosis not present

## 2019-03-23 DIAGNOSIS — I10 Essential (primary) hypertension: Secondary | ICD-10-CM | POA: Diagnosis not present

## 2019-03-23 DIAGNOSIS — E782 Mixed hyperlipidemia: Secondary | ICD-10-CM | POA: Diagnosis not present

## 2019-03-25 DIAGNOSIS — M8580 Other specified disorders of bone density and structure, unspecified site: Secondary | ICD-10-CM | POA: Diagnosis not present

## 2019-03-25 DIAGNOSIS — C50911 Malignant neoplasm of unspecified site of right female breast: Secondary | ICD-10-CM | POA: Diagnosis not present

## 2019-03-25 DIAGNOSIS — K219 Gastro-esophageal reflux disease without esophagitis: Secondary | ICD-10-CM | POA: Diagnosis not present

## 2019-03-25 DIAGNOSIS — E782 Mixed hyperlipidemia: Secondary | ICD-10-CM | POA: Diagnosis not present

## 2019-03-25 DIAGNOSIS — Z0000 Encounter for general adult medical examination without abnormal findings: Secondary | ICD-10-CM | POA: Diagnosis not present

## 2019-04-06 ENCOUNTER — Ambulatory Visit: Payer: Medicare Other | Admitting: Gastroenterology

## 2019-04-20 ENCOUNTER — Other Ambulatory Visit: Payer: Self-pay

## 2019-04-21 ENCOUNTER — Inpatient Hospital Stay (HOSPITAL_COMMUNITY): Payer: Medicare Other | Attending: Hematology | Admitting: Hematology

## 2019-04-21 ENCOUNTER — Other Ambulatory Visit (HOSPITAL_COMMUNITY): Payer: Self-pay | Admitting: Hematology

## 2019-04-21 ENCOUNTER — Encounter (HOSPITAL_COMMUNITY): Payer: Self-pay | Admitting: Hematology

## 2019-04-21 VITALS — BP 151/63 | HR 64 | Temp 98.3°F | Resp 18 | Wt 197.7 lb

## 2019-04-21 DIAGNOSIS — Z923 Personal history of irradiation: Secondary | ICD-10-CM | POA: Insufficient documentation

## 2019-04-21 DIAGNOSIS — Z801 Family history of malignant neoplasm of trachea, bronchus and lung: Secondary | ICD-10-CM | POA: Insufficient documentation

## 2019-04-21 DIAGNOSIS — Z170 Estrogen receptor positive status [ER+]: Secondary | ICD-10-CM | POA: Diagnosis not present

## 2019-04-21 DIAGNOSIS — M8580 Other specified disorders of bone density and structure, unspecified site: Secondary | ICD-10-CM | POA: Insufficient documentation

## 2019-04-21 DIAGNOSIS — C50911 Malignant neoplasm of unspecified site of right female breast: Secondary | ICD-10-CM

## 2019-04-21 DIAGNOSIS — Z13820 Encounter for screening for osteoporosis: Secondary | ICD-10-CM

## 2019-04-21 DIAGNOSIS — Z8049 Family history of malignant neoplasm of other genital organs: Secondary | ICD-10-CM | POA: Diagnosis not present

## 2019-04-21 DIAGNOSIS — Z87891 Personal history of nicotine dependence: Secondary | ICD-10-CM

## 2019-04-21 NOTE — Assessment & Plan Note (Addendum)
1.  Stage Ia right breast IDC: - Status post lumpectomy and sentinel lymph node biopsy on 04/30/2017 in [place].  Completed adjuvant radiation on 07/08/2017. -Started Arimidex on 06/03/2017.  She has discontinued Arimidex due to side effects. -At last visit she was started on Femara by Dr. Walden Field.  She reportedly develops dry vaginitis and could not tolerate it.  She does not want to try any other medication. -Last mammogram on 10/05/2018 shows indeterminate mass in the right breast in the region of the prior lumpectomy. - Right breast needle core biopsy at 3:15 position on 10/12/2018 shows scant benign adipose tissue with fibrosis.  No malignancy identified. -Today's examination did not reveal any palpable masses or adenopathy.  I have also reviewed blood work from Dr. Juel Burrow office. -We will see her back in 6 months for follow-up with repeat blood counts and physical exam.  We will schedule her for mammogram in November.  2.  Osteopenia: -Bone density test on 08/21/2017 shows T score of -1.1. - She was told to take calcium and vitamin D twice daily.  Will check vitamin D level prior to next visit.

## 2019-04-21 NOTE — Patient Instructions (Signed)
[facility] at [facility] Discharge Instructions  You were seen today by Dr. Delton Coombes. He went over your recent lab results. He will schedule you for a Bone Density test and Mammogram. He will see you back in 6 months for labs and follow up.   Thank you for choosing [facility] at [facility] to provide your oncology and hematology care.  To afford each patient quality time with our provider, please arrive at least 15 minutes before your scheduled appointment time.   If you have a lab appointment with the [facility] please come in thru the  Main Entrance and check in at the main information desk  You need to re-schedule your appointment should you arrive 10 or more minutes late.  We strive to give you quality time with our providers, and arriving late affects you and other patients whose appointments are after yours.  Also, if you no show three or more times for appointments you may be dismissed from the clinic at the providers discretion.     Again, thank you for choosing [facility].  Our hope is that these requests will decrease the amount of time that you wait before being seen by our physicians.       _____________________________________________________________  Should you have questions after your visit to [facility], please contact our office at [phone] between the hours of 8:00 a.m. and 4:30 p.m.  Voicemails left after 4:00 p.m. will not be returned until the following business day.  For prescription refill requests, have your pharmacy contact our office and allow 72 hours.    Cancer Center Support Programs:   > Cancer Support Group  2nd Tuesday of the month 1pm-2pm, Journey Room

## 2019-04-21 NOTE — Progress Notes (Signed)
[facility] [address], [place] [address]   CLINIC:  Medical Oncology/Hematology  PCP:  Celene Squibb, MD [place] [address] [phone]   REASON FOR VISIT:  Follow-up for breast cancer    INTERVAL HISTORY:  Kathy Evans 68 y.o. female returns for routine follow-up. She is here today alone. She states that she has been doing well since her last visit. She states that she is very active at home. She continues taking vit D and Calcium daily. Denies any nausea, vomiting, or diarrhea. Denies any new pains. Had not noticed any recent bleeding such as epistaxis, hematuria or hematochezia. Denies recent chest pain on exertion, shortness of breath on minimal exertion, pre-syncopal episodes, or palpitations. Denies any numbness or tingling in hands or feet. Denies any recent fevers, infections, or recent hospitalizations. Patient reports appetite at 100% and energy level at 50%.    REVIEW OF SYSTEMS:  Review of Systems  Musculoskeletal: Positive for arthralgias.  All other systems reviewed and are negative.    PAST MEDICAL/SURGICAL HISTORY:  Past Medical History:  Diagnosis Date  . Acid reflux   . Arthritis   . Breast cancer ([facility]) 03/2017   right  . Breast disorder    cancer  . Bronchitis   . Depression   . GERD (gastroesophageal reflux disease)   . Hyperlipidemia   . IBS (irritable bowel syndrome)   . Impingement syndrome of left shoulder   . Personal history of radiation therapy 2018   Past Surgical History:  Procedure Laterality Date  . ADENOIDECTOMY    . APPENDECTOMY    . BREAST BIOPSY Left    benign  . BREAST LUMPECTOMY Right 2018  . BUNIONECTOMY Right   . CARDIAC CATHETERIZATION    . CATARACT EXTRACTION, BILATERAL    . CHOLECYSTECTOMY    . colon obstruction     lysis of adhesions (no colon or small bowel resections)   . COLONOSCOPY WITH PROPOFOL N/A 02/02/2018   Dr. Oneida Alar: multiple small and large-mouthed diverticula  in recto-sigmoid colon, sigmoid, and descending. TI normal. Internal hemorrhoids during retroflexion, small. Moderate external hemorrhoids  . full mastectomy Right   . MASTECTOMY, PARTIAL Right   . PROLAPSED UTERINE FIBROID LIGATION    . TONSILLECTOMY    . TUBAL LIGATION    . VAGINAL HYSTERECTOMY    . VESICOVAGINAL FISTULA CLOSURE       SOCIAL HISTORY:  Social History   Socioeconomic History  . Marital status: Married    Spouse name: Not on file  . Number of children: Not on file  . Years of education: Not on file  . Highest education level: Not on file  Occupational History  . Occupation: retired  Scientific laboratory technician  . Financial resource strain: Not on file  . Food insecurity:    Worry: Not on file    Inability: Not on file  . Transportation needs:    Medical: Not on file    Non-medical: Not on file  Tobacco Use  . Smoking status: Former Smoker    Packs/day: 1.50    Years: 35.00    Pack years: 52.50    Types: Cigarettes    Last attempt to quit: 2000    Years since quitting: 20.4  . Smokeless tobacco: Never Used  Substance and Sexual Activity  . Alcohol use: Yes    Comment: very seldom  . Drug use: No  . Sexual activity: Not Currently    Birth  control/protection: Surgical    Comment: hyst  Lifestyle  . Physical activity:    Days per week: Not on file    Minutes per session: Not on file  . Stress: Not on file  Relationships  . Social connections:    Talks on phone: Not on file    Gets together: Not on file    Attends religious service: Not on file    Active member of club or organization: Not on file    Attends meetings of clubs or organizations: Not on file    Relationship status: Not on file  . Intimate partner violence:    Fear of current or ex partner: Not on file    Emotionally abused: Not on file    Physically abused: Not on file    Forced sexual activity: Not on file  Other Topics Concern  . Not on file  Social History Narrative  . Not on file     FAMILY HISTORY:  Family History  Problem Relation Age of Onset  . Diabetes Maternal Grandmother   . Alzheimer's disease Maternal Grandmother   . Heart disease Father   . Cancer Mother        uterine  . Diabetes Mother   . Heart disease Brother   . Diabetes Brother   . Cancer Sister        lung  . Diabetes Sister   . Heart disease Sister        had stent placed  . Heart disease Brother   . Cancer Sister        uterine  . Diabetes Sister   . High blood pressure Sister   . Colon cancer Neg Hx     CURRENT MEDICATIONS:  Outpatient Encounter Medications as of 04/21/2019  Medication Sig  . ALPRAZolam (XANAX) 0.5 MG tablet Take 0.5 mg by mouth daily as needed for anxiety.   [place] Kitchen buPROPion (WELLBUTRIN SR) 150 MG 12 hr tablet Take 300 mg by mouth at bedtime.   . Calcium Carb-Cholecalciferol (CALCIUM + D3) 600-800 MG-UNIT TABS Take 1,000 mg by mouth daily.  . diclofenac sodium (VOLTAREN) 1 % GEL APPLY 2 TO 4 GRAMS TO AFFECTED AREA EVERY 6 HOURS AS NEEDED FOR PAIN  . diphenhydrAMINE (BENADRYL) 25 MG tablet Take 25 mg by mouth every 6 (six) hours as needed.  . ezetimibe (ZETIA) 10 MG tablet Take 10 mg by mouth daily.   . fluticasone (FLONASE) 50 MCG/ACT nasal spray Place 1-2 sprays into both nostrils daily as needed for allergies or rhinitis.   Mar[place] Kitchen levocetirizine (XYZAL) 5 MG tablet Take 5 mg by mouth every evening.   . linaclotide (LINZESS) 72 MCG capsule Take 72 mcg by mouth daily as needed (for constipation).   . magnesium gluconate (MAGONATE) 500 MG tablet Take 500 mg by mouth daily.  [place] Kitchen omeprazole (PRILOSEC) 40 MG capsule Take 40 mg by mouth daily before breakfast.   . tetrahydrozoline-zinc (VISINE-AC) 0.05-0.25 % ophthalmic solution Place 1-2 drops into both eyes 3 (three) times daily as needed (for irritated/itchy/allergy eyes.).  Marland Kitchen ibuprofen (ADVIL,MOTRIN) 200 MG tablet Take 200-400 mg by mouth every 8 (eight) hours as needed (for pain.).  Mar[place] Kitchen letrozole (FEMARA) 2.5 MG tablet Take 1 tablet  (2.5 mg total) by mouth daily. (Patient not taking: Reported on 04/21/2019)  . meloxicam (MOBIC) 7.5 MG tablet Take 7.5 mg by mouth 2 (two) times daily.   No facility-administered encounter medications on file as of 04/21/2019.     ALLERGIES:  No Known  Allergies   PHYSICAL EXAM:  ECOG Performance status: 1  Vitals:   04/21/19 1433  BP: (!) 151/63  Pulse: 64  Resp: 18  Temp: 98.3 F (36.8 C)  SpO2: 98%   Filed Weights   04/21/19 1433  Weight: 197 lb 11.2 oz (89.7 kg)    Physical Exam Vitals signs reviewed.  Constitutional:      Appearance: Normal appearance.  Cardiovascular:     Rate and Rhythm: Normal rate and regular rhythm.     Heart sounds: Normal heart sounds.  Pulmonary:     Effort: Pulmonary effort is normal.     Breath sounds: Normal breath sounds.  Abdominal:     General: There is no distension.     Palpations: Abdomen is soft. There is no mass.  Musculoskeletal:        General: No swelling.  Skin:    General: Skin is warm.  Neurological:     General: No focal deficit present.     Mental Status: She is alert and oriented to person, place, and time.  Psychiatric:        Mood and Affect: Mood normal.        Behavior: Behavior normal.      LABORATORY DATA:  I have reviewed the labs as listed.  CBC    Component Value Date/Time   WBC 6.9 02/16/2018 1036   RBC 4.33 02/16/2018 1036   HGB 13.0 02/16/2018 1036   HCT 41.2 02/16/2018 1036   PLT 218 02/16/2018 1036   MCV 95.2 02/16/2018 1036   MCH 30.0 02/16/2018 1036   MCHC 31.6 02/16/2018 1036   RDW 13.7 02/16/2018 1036   LYMPHSABS 1.4 02/16/2018 1036   MONOABS 0.6 02/16/2018 1036   EOSABS 0.2 02/16/2018 1036   BASOSABS 0.1 02/16/2018 1036   CMP Latest Ref Rng & Units 02/16/2018 01/27/2018 08/18/2017  Glucose 65 - 99 mg/dL 84 94 103(H)  BUN 6 - 20 mg/dL 13 14 16   Creatinine 0.44 - 1.00 mg/dL 0.83 0.81 0.91  Sodium 135 - 145 mmol/L 141 140 137  Potassium 3.5 - 5.1 mmol/L 4.1 4.0 4.0  Chloride 101 -  111 mmol/L 105 106 104  CO2 22 - 32 mmol/L 24 24 25   Calcium 8.9 - 10.3 mg/dL 9.3 9.3 9.0  Total Protein 6.5 - 8.1 g/dL 7.4 - 7.2  Total Bilirubin 0.3 - 1.2 mg/dL 0.5 - 0.4  Alkaline Phos 38 - 126 U/L 90 - 87  AST 15 - 41 U/L 19 - 16  ALT 14 - 54 U/L 18 - 14       DIAGNOSTIC IMAGING:  I have independently reviewed the scans and discussed with the patient.   I have reviewed Venita Lick LPN's note and agree with the documentation.  I personally performed a face-to-face visit, made revisions and my assessment and plan is as follows.    ASSESSMENT & PLAN:   Ductal carcinoma of breast, estrogen receptor positive, stage 1 ([facility]) 1.  Stage Ia right breast IDC: - Status post lumpectomy and sentinel lymph node biopsy on 04/30/2017 in [place].  Completed adjuvant radiation on 07/08/2017. -Started Arimidex on 06/03/2017.  She has discontinued Arimidex due to side effects. -At last visit she was started on Femara by Dr. Walden Field.  She reportedly develops dry vaginitis and could not tolerate it.  She does not want to try any other medication. -Last mammogram on 10/05/2018 shows indeterminate mass in the right breast in the region of the prior lumpectomy. - Right  breast needle core biopsy at 3:15 position on 10/12/2018 shows scant benign adipose tissue with fibrosis.  No malignancy identified. -Today's examination did not reveal any palpable masses or adenopathy. -We will see her back in 6 months for follow-up with repeat blood counts and physical exam.  We will schedule her for mammogram in November.  2.  Osteopenia: -Bone density test on 08/21/2017 shows T score of -1.1. - She was told to take calcium and vitamin D twice daily.  Will check vitamin D level prior to next visit.  She was counseled to resume her exercise. Total time spent is 25 minutes with more than 50% of the time spent face-to-face discussing optimal management of breast cancer, counseling and coordination of care.  Orders  placed this encounter:  Orders Placed This Encounter  Procedures  . DG Bone Density  . MM DIAG BREAST TOMO BILATERAL      Derek Jack, MD [facility] [phone]

## 2019-07-11 DIAGNOSIS — R21 Rash and other nonspecific skin eruption: Secondary | ICD-10-CM | POA: Diagnosis not present

## 2019-08-04 DIAGNOSIS — Z1283 Encounter for screening for malignant neoplasm of skin: Secondary | ICD-10-CM | POA: Diagnosis not present

## 2019-08-04 DIAGNOSIS — L258 Unspecified contact dermatitis due to other agents: Secondary | ICD-10-CM | POA: Diagnosis not present

## 2019-08-04 DIAGNOSIS — D225 Melanocytic nevi of trunk: Secondary | ICD-10-CM | POA: Diagnosis not present

## 2019-08-11 DIAGNOSIS — Z7689 Persons encountering health services in other specified circumstances: Secondary | ICD-10-CM | POA: Diagnosis not present

## 2019-08-26 ENCOUNTER — Telehealth: Payer: Self-pay | Admitting: Cardiovascular Disease

## 2019-08-26 NOTE — Telephone Encounter (Signed)

## 2019-08-31 ENCOUNTER — Encounter: Payer: Self-pay | Admitting: Cardiovascular Disease

## 2019-08-31 ENCOUNTER — Other Ambulatory Visit: Payer: Self-pay

## 2019-08-31 ENCOUNTER — Ambulatory Visit (INDEPENDENT_AMBULATORY_CARE_PROVIDER_SITE_OTHER): Payer: Medicare Other | Admitting: Cardiovascular Disease

## 2019-08-31 VITALS — BP 130/74 | HR 59 | Ht 63.5 in | Wt 195.0 lb

## 2019-08-31 DIAGNOSIS — R9431 Abnormal electrocardiogram [ECG] [EKG]: Secondary | ICD-10-CM

## 2019-08-31 DIAGNOSIS — Z23 Encounter for immunization: Secondary | ICD-10-CM | POA: Diagnosis not present

## 2019-08-31 DIAGNOSIS — R42 Dizziness and giddiness: Secondary | ICD-10-CM | POA: Diagnosis not present

## 2019-08-31 NOTE — Patient Instructions (Addendum)
Medication Instructions:    Your physician recommends that you continue on your current medications as directed. Please refer to the Current Medication list given to you today.  Labwork:  NONE  Testing/Procedures:  NONE  Follow-Up:  Your physician recommends that you schedule a follow-up appointment in: 1 year. You will receive a reminder letter in the mail in about 12 months reminding you to call and schedule your appointment. If you don't receive this letter, please contact our office.  Any Other Special Instructions Will Be Listed Below (If Applicable).  If you need a refill on your cardiac medications before your next appointment, please call your pharmacy.

## 2019-08-31 NOTE — Progress Notes (Signed)
SUBJECTIVE: The patient presents for follow-up of an abnormal ECG.  Echocardiogram on 02/17/2018 demonstrated normal left ventricular systolic function and regional wall motion, LVEF 60 to 65%, mild LVH, grade 1 diastolic dysfunction, and mild aortic and tricuspid regurgitation.  Past medical history includes breast cancer diagnosed in May 2018 for which she underwent right partial mastectomy followed by adjuvant radiation.  I personally reviewed the ECG performed today which demonstrates sinus bradycardia, 57 bpm, with an automated reading of "septal infarct ".  She swims 30 minutes 3 times per week and denies exertional chest pain and dyspnea.  She denies leg swelling.  She has occasional leg and feet cramps if she does not drink enough water and also takes magnesium for this.  She seldom has palpitations associated with a cough.  She has had at least 2 episodes of episodic dizziness where she felt like she was going to pass out but denies any syncope.  She has had a few episodes where she lies on her stomach at night before she rolls over on her side to sleep and has felt short of breath when lying down on her stomach.  She has been under a lot of stress as their stepson now lives with them and he has schizophrenia and bipolar disorder and has several other medical issues.  She has intentionally lost about 50 pounds through dietary modification and exercise.    Family history:Her father died of an MI at the age of 14. Both her brothers developed coronary disease in their 86s. One has a stent in 1 underwent bypass surgery and also had an atrial fibrillation ablation. Her sister had a coronary artery stent at the age of 47.   Social history: She is married.  Her husband Kennith Center) is also my patient.  [place]  She has a stepson with schizophrenia and bipolar disorder.  Review of Systems: As per "subjective", otherwise negative.   No Known Allergies  Current Outpatient Medications  Medication Sig Dispense Refill  . ALPRAZolam (XANAX) 0.5 MG tablet Take 0.5 mg by mouth daily as needed for anxiety.     Marland Kitchen buPROPion (WELLBUTRIN SR) 150 MG 12 hr tablet Take 300 mg by mouth at bedtime.     . Calcium Carb-Cholecalciferol (CALCIUM + D3) 600-800 MG-UNIT TABS Take 1,000 mg by mouth daily.    . diclofenac sodium (VOLTAREN) 1 % GEL APPLY 2 TO 4 GRAMS TO AFFECTED AREA EVERY 6 HOURS AS NEEDED FOR PAIN  2  . diphenhydrAMINE (BENADRYL) 25 MG tablet Take 25 mg by mouth every 6 (six) hours as needed.    . ezetimibe (ZETIA) 10 MG tablet Take 10 mg by mouth daily.     . fluticasone (FLONASE) 50 MCG/ACT nasal spray Place 1-2 sprays into both nostrils daily as needed for allergies or rhinitis.     Marland Kitchen ibuprofen (ADVIL,MOTRIN) 200 MG tablet Take 200-400 mg by mouth every 8 (eight) hours as needed (for pain.).    Marland Kitchen levocetirizine (XYZAL) 5 MG tablet Take 5 mg by mouth every evening.     . linaclotide (LINZESS) 72 MCG capsule Take 72 mcg by mouth daily as needed (for constipation).     Marland Kitchen omeprazole (PRILOSEC) 40 MG capsule Take 40 mg by mouth daily before breakfast.     . tetrahydrozoline-zinc (VISINE-AC) 0.05-0.25 % ophthalmic solution Place 1-2 drops into both eyes 3 (three) times daily as needed (for irritated/itchy/allergy eyes.).     No current facility-administered medications  for this visit.     Past Medical History:  Diagnosis Date  . Acid reflux   . Arthritis   . Breast cancer ([facility]) 03/2017   right  . Breast disorder    cancer  . Bronchitis   . Depression   . GERD (gastroesophageal reflux disease)   . Hyperlipidemia   . IBS (irritable bowel syndrome)   . Impingement syndrome of left shoulder   . Personal history of radiation therapy 2018    Past Surgical History:  Procedure Laterality Date  . ADENOIDECTOMY    . APPENDECTOMY    . BREAST BIOPSY Left    benign  . BREAST LUMPECTOMY Right 2018  . BUNIONECTOMY Right    . CARDIAC CATHETERIZATION    . CATARACT EXTRACTION, BILATERAL    . CHOLECYSTECTOMY    . colon obstruction     lysis of adhesions (no colon or small bowel resections)   . COLONOSCOPY WITH PROPOFOL N/A 02/02/2018   Dr. Oneida Alar: multiple small and large-mouthed diverticula in recto-sigmoid colon, sigmoid, and descending. TI normal. Internal hemorrhoids during retroflexion, small. Moderate external hemorrhoids  . full mastectomy Right   . MASTECTOMY, PARTIAL Right   . PROLAPSED UTERINE FIBROID LIGATION    . TONSILLECTOMY    . TUBAL LIGATION    . VAGINAL HYSTERECTOMY    . VESICOVAGINAL FISTULA CLOSURE      Social History   Socioeconomic History  . Marital status: Married    Spouse name: Not on file  . Number of children: Not on file  . Years of education: Not on file  . Highest education level: Not on file  Occupational History  . Occupation: retired  Scientific laboratory technician  . Financial resource strain: Not on file  . Food insecurity    Worry: Not on file    Inability: Not on file  . Transportation needs    Medical: Not on file    Non-medical: Not on file  Tobacco Use  . Smoking status: Former Smoker    Packs/day: 1.50    Years: 35.00    Pack years: 52.50    Types: Cigarettes    Quit date: 2000    Years since quitting: 20.8  . Smokeless tobacco: Never Used  Substance and Sexual Activity  . Alcohol use: Yes    Comment: very seldom  . Drug use: No  . Sexual activity: Not Currently    Birth control/protection: Surgical    Comment: hyst  Lifestyle  . Physical activity    Days per week: Not on file    Minutes per session: Not on file  . Stress: Not on file  Relationships  . Social Herbalist on phone: Not on file    Gets together: Not on file    Attends religious service: Not on file    Active member of club or organization: Not on file    Attends meetings of clubs or organizations: Not on file    Relationship status: Not on file  . Intimate partner violence     Fear of current or ex partner: Not on file    Emotionally abused: Not on file    Physically abused: Not on file    Forced sexual activity: Not on file  Other Topics Concern  . Not on file  Social History Narrative  . Not on file     Vitals:   08/31/19 1437  Weight: 195 lb (88.5 kg)  Height: 5' 3.5" (1.613 m)  Wt Readings from Last 3 Encounters:  08/31/19 195 lb (88.5 kg)  04/21/19 197 lb 11.2 oz (89.7 kg)  10/20/18 222 lb (100.7 kg)     PHYSICAL EXAM General: NAD HEENT: Normal. Neck: No JVD, no thyromegaly. Lungs: Clear to auscultation bilaterally with normal respiratory effort. CV: Regular rate and rhythm, normal S1/S2, no S3/S4, no murmur. No pretibial or periankle edema.  No carotid bruit.   Abdomen: Soft, nontender, no distention.  Neurologic: Alert and oriented.  Psych: Normal affect. Skin: Normal. Musculoskeletal: No gross deformities.      Labs: Lab Results  Component Value Date/Time   K 4.1 02/16/2018 10:36 AM   BUN 13 02/16/2018 10:36 AM   CREATININE 0.83 02/16/2018 10:36 AM   ALT 18 02/16/2018 10:36 AM   HGB 13.0 02/16/2018 10:36 AM     Lipids: No results found for: LDLCALC, LDLDIRECT, CHOL, TRIG, HDL     ASSESSMENT AND PLAN: 1.  Abnormal ECG: She denies exertional chest pain and dyspnea and appears to have good exercise tolerance.  Echocardiogram demonstrated normal left ventricular systolic function and regional wall motion as noted above.  At this time I do not feel any further cardiac testing is indicated.  2.  Episodic dizziness: Blood pressure is normal today.  Heart rate is also in upper 50 bpm range.  Episodes have been infrequent and sporadic.  I will continue to monitor.   Disposition: Follow up 1 yr   Kate Sable, M.D., F.A.C.C.

## 2019-08-31 NOTE — Addendum Note (Signed)
Addended by: Julian Hy T on: 08/31/2019 04:12 PM   Modules accepted: Orders

## 2019-09-05 ENCOUNTER — Ambulatory Visit (HOSPITAL_COMMUNITY)
Admission: RE | Admit: 2019-09-05 | Discharge: 2019-09-05 | Disposition: A | Discharge status: Home / Self Care | Payer: Medicare Other | Source: Ambulatory Visit | Attending: Hematology | Admitting: Hematology

## 2019-09-05 ENCOUNTER — Other Ambulatory Visit: Payer: Self-pay

## 2019-09-05 DIAGNOSIS — C50911 Malignant neoplasm of unspecified site of right female breast: Secondary | ICD-10-CM | POA: Insufficient documentation

## 2019-09-05 DIAGNOSIS — Z13820 Encounter for screening for osteoporosis: Secondary | ICD-10-CM | POA: Insufficient documentation

## 2019-09-05 DIAGNOSIS — Z170 Estrogen receptor positive status [ER+]: Secondary | ICD-10-CM | POA: Insufficient documentation

## 2019-09-05 DIAGNOSIS — Z780 Asymptomatic menopausal state: Secondary | ICD-10-CM | POA: Diagnosis not present

## 2019-09-05 DIAGNOSIS — M8589 Other specified disorders of bone density and structure, multiple sites: Secondary | ICD-10-CM | POA: Diagnosis not present

## 2019-10-10 ENCOUNTER — Telehealth: Payer: Self-pay

## 2019-10-10 NOTE — Telephone Encounter (Signed)
Patient called stating she was checking to see if we had received her records from [facility]. I checked with the other girls upfront and nobody has spoken with this patient. I told the lady we had nothing in our system about our office speaking with her. Stated that she had signed a release with [facility] to send our office her records back on 10/06/19. I told her that as of this morning we have not received any records and when we do our office will give her a call.

## 2019-10-12 NOTE — Telephone Encounter (Signed)
Records from [facility] received. Called patient to notify - in Dr Harrison's box for review.

## 2019-10-18 ENCOUNTER — Ambulatory Visit (HOSPITAL_COMMUNITY): Payer: Medicare Other | Admitting: Hematology

## 2019-10-18 ENCOUNTER — Ambulatory Visit (HOSPITAL_COMMUNITY): Payer: Medicare Other

## 2019-10-18 ENCOUNTER — Ambulatory Visit (HOSPITAL_COMMUNITY): Admission: RE | Admit: 2019-10-18 | Payer: Medicare Other | Source: Ambulatory Visit

## 2019-10-18 ENCOUNTER — Ambulatory Visit (HOSPITAL_COMMUNITY)
Admission: RE | Admit: 2019-10-18 | Discharge: 2019-10-18 | Disposition: A | Discharge status: Home / Self Care | Payer: Medicare Other | Source: Ambulatory Visit | Attending: Hematology | Admitting: Hematology

## 2019-10-18 ENCOUNTER — Other Ambulatory Visit: Payer: Self-pay

## 2019-10-18 DIAGNOSIS — Z923 Personal history of irradiation: Secondary | ICD-10-CM | POA: Diagnosis not present

## 2019-10-18 DIAGNOSIS — R928 Other abnormal and inconclusive findings on diagnostic imaging of breast: Secondary | ICD-10-CM | POA: Diagnosis not present

## 2019-10-18 DIAGNOSIS — Z853 Personal history of malignant neoplasm of breast: Secondary | ICD-10-CM | POA: Insufficient documentation

## 2019-10-18 DIAGNOSIS — Z13820 Encounter for screening for osteoporosis: Secondary | ICD-10-CM

## 2019-10-18 DIAGNOSIS — C50911 Malignant neoplasm of unspecified site of right female breast: Secondary | ICD-10-CM

## 2019-10-18 DIAGNOSIS — Z170 Estrogen receptor positive status [ER+]: Secondary | ICD-10-CM

## 2019-10-20 DIAGNOSIS — E782 Mixed hyperlipidemia: Secondary | ICD-10-CM | POA: Diagnosis not present

## 2019-10-25 ENCOUNTER — Encounter (HOSPITAL_COMMUNITY): Payer: Self-pay | Admitting: Hematology

## 2019-10-25 ENCOUNTER — Other Ambulatory Visit: Payer: Self-pay

## 2019-10-25 ENCOUNTER — Inpatient Hospital Stay (HOSPITAL_COMMUNITY): Payer: Medicare Other | Attending: Hematology | Admitting: Hematology

## 2019-10-25 DIAGNOSIS — Z170 Estrogen receptor positive status [ER+]: Secondary | ICD-10-CM | POA: Insufficient documentation

## 2019-10-25 DIAGNOSIS — C50911 Malignant neoplasm of unspecified site of right female breast: Secondary | ICD-10-CM | POA: Insufficient documentation

## 2019-10-25 DIAGNOSIS — E782 Mixed hyperlipidemia: Secondary | ICD-10-CM | POA: Diagnosis not present

## 2019-10-25 DIAGNOSIS — Z923 Personal history of irradiation: Secondary | ICD-10-CM | POA: Diagnosis not present

## 2019-10-25 DIAGNOSIS — I10 Essential (primary) hypertension: Secondary | ICD-10-CM | POA: Diagnosis not present

## 2019-10-25 DIAGNOSIS — Z1329 Encounter for screening for other suspected endocrine disorder: Secondary | ICD-10-CM | POA: Diagnosis not present

## 2019-10-25 DIAGNOSIS — Z87891 Personal history of nicotine dependence: Secondary | ICD-10-CM | POA: Diagnosis not present

## 2019-10-25 DIAGNOSIS — M8580 Other specified disorders of bone density and structure, unspecified site: Secondary | ICD-10-CM | POA: Diagnosis not present

## 2019-10-25 DIAGNOSIS — Z0000 Encounter for general adult medical examination without abnormal findings: Secondary | ICD-10-CM | POA: Diagnosis not present

## 2019-10-25 NOTE — Progress Notes (Signed)
[address] [place], [place] [address]   CLINIC:  Medical Oncology/Hematology  PCP:  Celene Squibb, MD [place] [address] [phone]   REASON FOR VISIT:  Follow-up for breast cancer  CURRENT THERAPY: Clinical surveillance    INTERVAL HISTORY:  Kathy Evans 68 y.o. female presents today for follow-up.  She reports overall doing well.  She denies any changes in her health history since her last visit.  She denies any significant fatigue.  No change in bowel habits.  Appetite is stable.  No weight loss.  Denies any changes in her breasts.  No new lumps, masses, nipple inversion or discharge.  She states she does have occasional tenderness around biopsy sites.  She is here to discuss results of recent mammogram.   REVIEW OF SYSTEMS:  Review of Systems  All other systems reviewed and are negative.    PAST MEDICAL/SURGICAL HISTORY:  Past Medical History:  Diagnosis Date  . Acid reflux   . Arthritis   . Breast cancer ([facility]) 03/2017   right  . Breast disorder    cancer  . Bronchitis   . Depression   . GERD (gastroesophageal reflux disease)   . Hyperlipidemia   . IBS (irritable bowel syndrome)   . Impingement syndrome of left shoulder   . Personal history of radiation therapy 2018   Past Surgical History:  Procedure Laterality Date  . ADENOIDECTOMY    . APPENDECTOMY    . BREAST BIOPSY Left    benign  . BREAST LUMPECTOMY Right 2018  . BUNIONECTOMY Right   . CARDIAC CATHETERIZATION    . CATARACT EXTRACTION, BILATERAL    . CHOLECYSTECTOMY    . colon obstruction     lysis of adhesions (no colon or small bowel resections)   . COLONOSCOPY WITH PROPOFOL N/A 02/02/2018   Dr. Oneida Alar: multiple small and large-mouthed diverticula in recto-sigmoid colon, sigmoid, and descending. TI normal. Internal hemorrhoids during retroflexion, small. Moderate external hemorrhoids  . full mastectomy Right   . MASTECTOMY, PARTIAL Right   .  PROLAPSED UTERINE FIBROID LIGATION    . TONSILLECTOMY    . TUBAL LIGATION    . VAGINAL HYSTERECTOMY    . VESICOVAGINAL FISTULA CLOSURE       SOCIAL HISTORY:  Social History   Socioeconomic History  . Marital status: Married    Spouse name: Not on file  . Number of children: Not on file  . Years of education: Not on file  . Highest education level: Not on file  Occupational History  . Occupation: retired  Scientific laboratory technician  . Financial resource strain: Not on file  . Food insecurity    Worry: Not on file    Inability: Not on file  . Transportation needs    Medical: Not on file    Non-medical: Not on file  Tobacco Use  . Smoking status: Former Smoker    Packs/day: 1.50    Years: 35.00    Pack years: 52.50    Types: Cigarettes    Quit date: 2000    Years since quitting: 20.9  . Smokeless tobacco: Never Used  Substance and Sexual Activity  . Alcohol use: Yes    Comment: very seldom  . Drug use: No  . Sexual activity: Not Currently    Birth control/protection: Surgical    Comment: hyst  Lifestyle  . Physical activity    Days per week: Not on file    Minutes  per session: Not on file  . Stress: Not on file  Relationships  . Social Herbalist on phone: Not on file    Gets together: Not on file    Attends religious service: Not on file    Active member of club or organization: Not on file    Attends meetings of clubs or organizations: Not on file    Relationship status: Not on file  . Intimate partner violence    Fear of current or ex partner: Not on file    Emotionally abused: Not on file    Physically abused: Not on file    Forced sexual activity: Not on file  Other Topics Concern  . Not on file  Social History Narrative  . Not on file    FAMILY HISTORY:  Family History  Problem Relation Age of Onset  . Diabetes Maternal Grandmother   . Alzheimer's disease Maternal Grandmother   . Heart disease Father   . Cancer Mother        uterine  .  Diabetes Mother   . Heart disease Brother   . Diabetes Brother   . Cancer Sister        lung  . Diabetes Sister   . Heart disease Sister        had stent placed  . Heart disease Brother   . Cancer Sister        uterine  . Diabetes Sister   . High blood pressure Sister   . Colon cancer Neg Hx     CURRENT MEDICATIONS:  Outpatient Encounter Medications as of 10/25/2019  Medication Sig  . Biotin 10000 MCG TABS   . buPROPion (WELLBUTRIN SR) 150 MG 12 hr tablet Take 300 mg by mouth at bedtime.   . diclofenac sodium (VOLTAREN) 1 % GEL APPLY 2 TO 4 GRAMS TO AFFECTED AREA EVERY 6 HOURS AS NEEDED FOR PAIN  . ezetimibe (ZETIA) 10 MG tablet Take 10 mg by mouth daily.   . fluticasone (FLONASE) 50 MCG/ACT nasal spray Place 1-2 sprays into both nostrils daily as needed for allergies or rhinitis.   Marland Kitchen levocetirizine (XYZAL) 5 MG tablet Take 5 mg by mouth every evening.   . linaclotide (LINZESS) 72 MCG capsule Take 72 mcg by mouth daily as needed (for constipation).   Marland Kitchen omeprazole (PRILOSEC) 40 MG capsule Take 40 mg by mouth daily before breakfast.   . tetrahydrozoline-zinc (VISINE-AC) 0.05-0.25 % ophthalmic solution Place 1-2 drops into both eyes 3 (three) times daily as needed (for irritated/itchy/allergy eyes.).  Marland Kitchen ALPRAZolam (XANAX) 0.5 MG tablet Take 0.5 mg by mouth daily as needed for anxiety.   . [DISCONTINUED] Calcium Carb-Cholecalciferol (CALCIUM + D3) 600-800 MG-UNIT TABS Take 1,200 mg by mouth daily.   . [DISCONTINUED] diphenhydrAMINE (BENADRYL) 25 MG tablet Take 25 mg by mouth every 6 (six) hours as needed.  . [DISCONTINUED] ibuprofen (ADVIL,MOTRIN) 200 MG tablet Take 200-400 mg by mouth every 8 (eight) hours as needed (for pain.).   No facility-administered encounter medications on file as of 10/25/2019.     ALLERGIES:  No Known Allergies   PHYSICAL EXAM:  ECOG Performance status: 1  Vitals:   10/25/19 1400  BP: (!) 146/84  Pulse: 65  Resp: 16  Temp: (!) 97.3 F (36.3 C)   SpO2: 98%   Filed Weights   10/25/19 1400  Weight: 201 lb 4 oz (91.3 kg)    Physical Exam Constitutional:      Appearance: Normal appearance.  HENT:     Head: Normocephalic.  Eyes:     Conjunctiva/sclera: Conjunctivae normal.  Neck:     Musculoskeletal: Normal range of motion.  Cardiovascular:     Rate and Rhythm: Normal rate and regular rhythm.     Pulses: Normal pulses.     Heart sounds: Normal heart sounds.  Pulmonary:     Effort: Pulmonary effort is normal.     Breath sounds: Normal breath sounds.  Abdominal:     General: Bowel sounds are normal.     Palpations: Abdomen is soft.  Musculoskeletal:     Comments: deCreased range of motion  Skin:    General: Skin is warm.     Comments: Lumpectomy site present on right breast  Neurological:     General: No focal deficit present.     Mental Status: She is alert and oriented to person, place, and time.  Psychiatric:        Mood and Affect: Mood normal.        Behavior: Behavior normal.      LABORATORY DATA:  I have reviewed the labs as listed.  CBC    Component Value Date/Time   WBC 6.9 02/16/2018 1036   RBC 4.33 02/16/2018 1036   HGB 13.0 02/16/2018 1036   HCT 41.2 02/16/2018 1036   PLT 218 02/16/2018 1036   MCV 95.2 02/16/2018 1036   MCH 30.0 02/16/2018 1036   MCHC 31.6 02/16/2018 1036   RDW 13.7 02/16/2018 1036   LYMPHSABS 1.4 02/16/2018 1036   MONOABS 0.6 02/16/2018 1036   EOSABS 0.2 02/16/2018 1036   BASOSABS 0.1 02/16/2018 1036   CMP Latest Ref Rng & Units 02/16/2018 01/27/2018 08/18/2017  Glucose 65 - 99 mg/dL 84 94 103(H)  BUN 6 - 20 mg/dL 13 14 16   Creatinine 0.44 - 1.00 mg/dL 0.83 0.81 0.91  Sodium 135 - 145 mmol/L 141 140 137  Potassium 3.5 - 5.1 mmol/L 4.1 4.0 4.0  Chloride 101 - 111 mmol/L 105 106 104  CO2 22 - 32 mmol/L 24 24 25   Calcium 8.9 - 10.3 mg/dL 9.3 9.3 9.0  Total Protein 6.5 - 8.1 g/dL 7.4 - 7.2  Total Bilirubin 0.3 - 1.2 mg/dL 0.5 - 0.4  Alkaline Phos 38 - 126 U/L 90 - 87  AST  15 - 41 U/L 19 - 16  ALT 14 - 54 U/L 18 - 14         ASSESSMENT & PLAN:   Ductal carcinoma of breast, estrogen receptor positive, stage 1 (HCC) 1.  Stage Ia right breast IDC: - Status post lumpectomy and sentinel lymph node biopsy on 04/30/2017 in [place].  Completed adjuvant radiation on 07/08/2017. -Started Arimidex on 06/03/2017.  She has discontinued Arimidex due to side effects. -At last visit she was started on Femara by Dr. Walden Field.  She reportedly develops dry vaginitis and could not tolerate it.  She does not want to try any other medication. -Last mammogram on 10/05/2018 shows indeterminate mass in the right breast in the region of the prior lumpectomy. - Right breast needle core biopsy at 3:15 position on 10/12/2018 shows scant benign adipose tissue with fibrosis.  No malignancy identified. -Today's examination did not reveal any palpable masses or adenopathy.  -Diagnostic mammogram completed on October 18, 2019 showed no evidence of malignancy, BI-RADS Category 2. -We will continue with follow-ups.  Patient will return to clinic in 6 months.  We will have her labs performed and reviewed by her primary care provider.  2.  Osteopenia: -Bone density test on 08/21/2017 shows T score of -1.1. -Bone density test completed on 09/05/2019 shows T score of -1.1. - She was told to take calcium and vitamin D twice daily.          [place] [phone]

## 2019-10-25 NOTE — Assessment & Plan Note (Addendum)
1.  Stage Ia right breast IDC: - Status post lumpectomy and sentinel lymph node biopsy on 04/30/2017 in [place].  Completed adjuvant radiation on 07/08/2017. -Started Arimidex on 06/03/2017.  She has discontinued Arimidex due to side effects. -At last visit she was started on Femara by Dr. Walden Field.  She reportedly develops dry vaginitis and could not tolerate it.  She does not want to try any other medication. -Last mammogram on 10/05/2018 shows indeterminate mass in the right breast in the region of the prior lumpectomy. - Right breast needle core biopsy at 3:15 position on 10/12/2018 shows scant benign adipose tissue with fibrosis.  No malignancy identified. -Today's examination did not reveal any palpable masses or adenopathy.  -Diagnostic mammogram completed on October 18, 2019 showed no evidence of malignancy, BI-RADS Category 2. -We will continue with follow-ups.  Patient will return to clinic in 6 months.  We will have her labs performed and reviewed by her primary care provider.  2.  Osteopenia: -Bone density test on 08/21/2017 shows T score of -1.1. -Bone density test completed on 09/05/2019 shows T score of -1.1. - She was told to take calcium and vitamin D twice daily.

## 2019-10-27 NOTE — Telephone Encounter (Signed)
Patient called to inquire about status of records review. Called back, left message. (will relay to patient upon return call that records are currently still in box - need to verify which of the orthopaedic medical issues patient is requesting a second opinion on)

## 2019-11-02 ENCOUNTER — Encounter: Payer: Self-pay | Admitting: Orthopedic Surgery

## 2019-11-02 NOTE — Telephone Encounter (Signed)
Called patient; scheduled per Dr Ruthe Mannan approval. Patient aware.

## 2019-11-09 DIAGNOSIS — G720 Drug-induced myopathy: Secondary | ICD-10-CM | POA: Diagnosis not present

## 2019-11-09 DIAGNOSIS — R252 Cramp and spasm: Secondary | ICD-10-CM | POA: Diagnosis not present

## 2019-11-09 DIAGNOSIS — E782 Mixed hyperlipidemia: Secondary | ICD-10-CM | POA: Diagnosis not present

## 2019-11-09 DIAGNOSIS — K219 Gastro-esophageal reflux disease without esophagitis: Secondary | ICD-10-CM | POA: Diagnosis not present

## 2019-11-09 DIAGNOSIS — R002 Palpitations: Secondary | ICD-10-CM | POA: Diagnosis not present

## 2019-11-09 DIAGNOSIS — M8580 Other specified disorders of bone density and structure, unspecified site: Secondary | ICD-10-CM | POA: Diagnosis not present

## 2019-11-30 ENCOUNTER — Ambulatory Visit: Payer: Medicare Other | Admitting: Orthopedic Surgery

## 2019-11-30 ENCOUNTER — Ambulatory Visit: Payer: Medicare Other

## 2019-11-30 ENCOUNTER — Other Ambulatory Visit: Payer: Self-pay

## 2019-11-30 VITALS — BP 130/78 | HR 65 | Temp 97.0°F | Ht 63.0 in | Wt 200.0 lb

## 2019-11-30 DIAGNOSIS — M25561 Pain in right knee: Secondary | ICD-10-CM

## 2019-11-30 DIAGNOSIS — M7051 Other bursitis of knee, right knee: Secondary | ICD-10-CM | POA: Diagnosis not present

## 2019-11-30 DIAGNOSIS — M1710 Unilateral primary osteoarthritis, unspecified knee: Secondary | ICD-10-CM | POA: Diagnosis not present

## 2019-11-30 DIAGNOSIS — G8929 Other chronic pain: Secondary | ICD-10-CM

## 2019-11-30 NOTE — Progress Notes (Signed)
Kathy Evans  11/30/2019  Body mass index is 35.43 kg/m.   HISTORY SECTION :  Chief Complaint  Patient presents with  . Knee Pain    Right knee pain.   69 year old female wishes to transfer care previously seen by Dr. Percell Miller for care but no longer wants to go to [facility] in with chief complaint of swelling right knee.  Area of swelling just below the pes tendons.  She has mild pain in the right knee chronic seems to be manageable at present.  She does notice intermittent stiffness after she fell but after therapy and swimming her stiffness improved and the strength improved as she was having some giving way episodes  She complains of crepitance in the patellofemoral joint otherwise activities of daily living seem to be normal.  She is using Voltaren gel intermittently she takes ibuprofen for pain  She is primarily interested in evaluating the swelling     Review of Systems  Cardiovascular: Positive for leg swelling.  Gastrointestinal: Positive for constipation and diarrhea.  Musculoskeletal: Positive for joint pain.  Endo/Heme/Allergies: Positive for environmental allergies.  All other systems reviewed and are negative.    has a past medical history of Acid reflux, Arthritis, Breast cancer ([place]) (03/2017), Breast disorder, Bronchitis, Depression, GERD (gastroesophageal reflux disease), Hyperlipidemia, IBS (irritable bowel syndrome), Impingement syndrome of left shoulder, and Personal history of radiation therapy (2018).   Past Surgical History:  Procedure Laterality Date  . ADENOIDECTOMY    . APPENDECTOMY    . BREAST BIOPSY Left    benign  . BREAST LUMPECTOMY Right 2018  . BUNIONECTOMY Right   . CARDIAC CATHETERIZATION    . CATARACT EXTRACTION, BILATERAL    . CHOLECYSTECTOMY    . colon obstruction     lysis of adhesions (no colon or small bowel resections)   . COLONOSCOPY WITH PROPOFOL N/A 02/02/2018   Dr. Oneida Alar: multiple small and large-mouthed diverticula  in recto-sigmoid colon, sigmoid, and descending. TI normal. Internal hemorrhoids during retroflexion, small. Moderate external hemorrhoids  . full mastectomy Right   . MASTECTOMY, PARTIAL Right   . PROLAPSED UTERINE FIBROID LIGATION    . TONSILLECTOMY    . TUBAL LIGATION    . VAGINAL HYSTERECTOMY    . VESICOVAGINAL FISTULA CLOSURE      Body mass index is 35.43 kg/m.   No Known Allergies   Current Outpatient Medications:  .  ALPRAZolam (XANAX) 0.5 MG tablet, Take 0.5 mg by mouth daily as needed for anxiety. , Disp: , Rfl:  .  Biotin 10000 MCG TABS, , Disp: , Rfl:  .  buPROPion (WELLBUTRIN SR) 150 MG 12 hr tablet, Take 300 mg by mouth at bedtime. , Disp: , Rfl:  .  ezetimibe (ZETIA) 10 MG tablet, Take 10 mg by mouth daily. , Disp: , Rfl:  .  fluticasone (FLONASE) 50 MCG/ACT nasal spray, Place 1-2 sprays into both nostrils daily as needed for allergies or rhinitis. , Disp: , Rfl:  .  levocetirizine (XYZAL) 5 MG tablet, Take 5 mg by mouth every evening. , Disp: , Rfl:  .  linaclotide (LINZESS) 72 MCG capsule, Take 72 mcg by mouth daily as needed (for constipation). , Disp: , Rfl:  .  omeprazole (PRILOSEC) 40 MG capsule, Take 40 mg by mouth daily before breakfast. , Disp: , Rfl:  .  tetrahydrozoline-zinc (VISINE-AC) 0.05-0.25 % ophthalmic solution, Place 1-2 drops into both eyes 3 (three) times daily as needed (for irritated/itchy/allergy eyes.)., Disp: , Rfl:  .  diclofenac sodium (VOLTAREN) 1 % GEL, APPLY 2 TO 4 GRAMS TO AFFECTED AREA EVERY 6 HOURS AS NEEDED FOR PAIN, Disp: , Rfl: 2   PHYSICAL EXAM SECTION: 1) BP 130/78   Pulse 65   Temp (!) 97 F (36.1 C)   Ht 5\' 3"  (1.6 m)   Wt 200 lb (90.7 kg)   BMI 35.43 kg/m   Body mass index is 35.43 kg/m. General appearance: Well-developed well-nourished no gross deformities  2) Cardiovascular normal pulse and perfusion in the lower extremities normal color without edema  3) Neurologically deep tendon reflexes are equal and normal, no  sensation loss or deficits no pathologic reflexes  4) Psychological: Awake alert and oriented x3 mood and affect normal  5) Skin no lacerations or ulcerations no nodularity no palpable masses, no erythema or nodularity  6) Musculoskeletal:   Right knee there is a pocket of swelling just below the pes tendons the pes tendons are tender  The joint lines are tender as well there is some crepitance on range of motion especially in the patella the patella however stable as are the collateral cruciate ligaments of the right knee  Left knee cruciate ligaments and collateral ligaments are stable   MEDICAL DECISION SECTION:  Encounter Diagnoses  Name Primary?  . Chronic pain of right knee Yes  . Primary localized osteoarthritis of knee   . Other bursitis of knee, right knee     Imaging  First image for interpretation.  X-rays from 2019 August 18  Right knee 4 views  All 3 compartments exhibit changes consistent with degenerative arthritis with surrounding osteophytes patella is centered there is tibiofemoral subluxation  New films were obtained to evaluate the knee in present time  Again we find degenerative arthritis all 3 compartments without progression from the x-ray taken in 2019   Plan:  (Rx., Inj., surg., Frx, MRI/CT, XR:2)  Recommend continue exercise continue swimming  Call us for symptomatic changes in the knee  [place] chronic pain right knee stable  Bursitis right knee acute uncomplicated illness   [id number] AM Arther Abbott, MD  11/30/2019

## 2019-11-30 NOTE — Patient Instructions (Signed)

## 2019-12-26 DIAGNOSIS — Z961 Presence of intraocular lens: Secondary | ICD-10-CM | POA: Diagnosis not present

## 2020-01-03 ENCOUNTER — Ambulatory Visit (INDEPENDENT_AMBULATORY_CARE_PROVIDER_SITE_OTHER): Payer: Medicare Other | Admitting: Cardiovascular Disease

## 2020-01-03 ENCOUNTER — Encounter: Payer: Self-pay | Admitting: Cardiovascular Disease

## 2020-01-03 VITALS — BP 130/78 | HR 65 | Ht 63.0 in | Wt 200.0 lb

## 2020-01-03 DIAGNOSIS — R0609 Other forms of dyspnea: Secondary | ICD-10-CM

## 2020-01-03 DIAGNOSIS — R0600 Dyspnea, unspecified: Secondary | ICD-10-CM | POA: Diagnosis not present

## 2020-01-03 DIAGNOSIS — R9431 Abnormal electrocardiogram [ECG] [EKG]: Secondary | ICD-10-CM | POA: Diagnosis not present

## 2020-01-03 DIAGNOSIS — R42 Dizziness and giddiness: Secondary | ICD-10-CM

## 2020-01-03 MED ORDER — METOPROLOL TARTRATE 100 MG PO TABS: ORAL_TABLET | ORAL | 0 refills | Status: DC

## 2020-01-03 NOTE — Patient Instructions (Addendum)
Your cardiac CT will be scheduled at one of the below locations:   [facility] [address] [place], [place] [address] [phone]  [facility] [address] [place], [place] [address] [phone]  If scheduled at [facility], please arrive at the [facility] main entrance of [facility] 30 minutes prior to test start time. Proceed to the [facility] Radiology Department (first floor) to check-in and test prep.  If scheduled at [facility], please arrive 15 mins early for check-in and test prep.  Please follow these instructions carefully (unless otherwise directed):    On the Night Before the Test: . Be sure to Drink plenty of water. . Do not consume any caffeinated/decaffeinated beverages or chocolate 12 hours prior to your test. . Do not take any antihistamines 12 hours prior to your test. . If you take Metformin do not take 24 hours prior to test.   On the Day of the Test: . Drink plenty of water. Do not drink any water within one hour of the test. . Do not eat any food 4 hours prior to the test. . You may take your regular medications prior to the test.  . Take metoprolol (Lopressor) two hours prior to test. . HOLD Furosemide/Hydrochlorothiazide morning of the test. . FEMALES- please wear underwire-free bra if available  ).       After the Test: . Drink plenty of water. . After receiving IV contrast, you may experience a mild flushed feeling. This is normal. . On occasion, you may experience a mild rash up to 24 hours after the test. This is not dangerous. If this occurs, you can take Benadryl 25 mg and increase your fluid intake. . If you experience trouble breathing, this can be serious. If it is severe call 911 IMMEDIATELY. If it is mild, please call our office. . If you take any of these medications: Glipizide/Metformin, Avandament, Glucavance, please do  not take 48 hours after completing test unless otherwise instructed.   Once we have confirmed authorization from your insurance company, we will call you to set up a date and time for your test.   For non-scheduling related questions, please contact the cardiac imaging nurse navigator should you have any questions/concerns: Marchia Bond, RN Navigator Cardiac Imaging [facility] [phone] mobile       Please schedule echocardiogram at [facility]    Follow up with Virtual Visit in 3 months with Dr.Koneswaran         Thank you for choosing [facility] !

## 2020-01-03 NOTE — Addendum Note (Signed)
Addended by: Barbarann Ehlers A on: 01/03/2020 09:34 AM   Modules accepted: Orders

## 2020-01-03 NOTE — Progress Notes (Signed)
Virtual Visit via Telephone Note   This visit type was conducted due to national recommendations for restrictions regarding the COVID-19 Pandemic (e.g. social distancing) in an effort to limit this patient's exposure and mitigate transmission in our community.  Due to her co-morbid illnesses, this patient is at least at moderate risk for complications without adequate follow up.  This format is felt to be most appropriate for this patient at this time.  The patient did not have access to video technology/had technical difficulties with video requiring transitioning to audio format only (telephone).  All issues noted in this document were discussed and addressed.  No physical exam could be performed with this format.  Please refer to the patient's chart for her  consent to telehealth for [facility].   Date:  01/03/2020   ID:  Kathy Evans, DOB 05-15-1951, MRN [id number]  Patient Location: Home Provider Location: Office  PCP:  Celene Squibb, MD  Cardiologist:  Kate Sable, MD  Electrophysiologist:  None   Evaluation Performed:  Follow-Up Visit  Chief Complaint: Shortness of breath and dizziness  History of Present Illness:    Kathy Evans is a 69 y.o. female with shortness of breath and dizziness.  I saw her on 08/31/2019 at which time she was doing well.  I personally reviewed the ECG performed on 08/31/2019 which demonstrated sinus bradycardia, 57 bpm, with an automated reading of "septal infarct ".  She has been having shortness of breath with exertion and it has been more frequent lately. She has no problem swimming.  She denies palpitations.  She even has some shortness of breath when she talks. It even occurs when she lies down at night.  She walked 10 steps to the kitchen yesterday and became lightheaded which scared her.  She started a new supplement but symptoms started prior to this.  She denies chest pain and tightness.  She doesn't think it's due to  anxiety or stress.  Her husband's son is under hospice care.  Family history:Her father died of an MI at the age of 53. Both her brothers developed coronary disease in their 74s. One has a stent in 1 underwent bypass surgery and also had an atrial fibrillation ablation. Her sister had a coronary artery stent at the age of 91.  Social history: She is married. Her husband Kennith [place]) is also my patient. [place] She has a stepson with schizophrenia and bipolar disorder.  Past Medical History:  Diagnosis Date  . Acid reflux   . Arthritis   . Breast cancer ([place]) 03/2017   right  . Breast disorder    cancer  . Bronchitis   . Depression   . GERD (gastroesophageal reflux disease)   . Hyperlipidemia   . IBS (irritable bowel syndrome)   . Impingement syndrome of left shoulder   . Personal history of radiation therapy 2018   Past Surgical History:  Procedure Laterality Date  . ADENOIDECTOMY    . APPENDECTOMY    . BREAST BIOPSY Left    benign  . BREAST LUMPECTOMY Right 2018  . BUNIONECTOMY Right   . CARDIAC CATHETERIZATION    . CATARACT EXTRACTION, BILATERAL    . CHOLECYSTECTOMY    . colon obstruction     lysis of adhesions (no colon or small bowel resections)   . COLONOSCOPY WITH PROPOFOL N/A 02/02/2018   Dr. Oneida Alar: multiple small and large-mouthed diverticula in recto-sigmoid colon, sigmoid, and descending. TI normal. Internal hemorrhoids during retroflexion, small.  Moderate external hemorrhoids  . full mastectomy Right   . MASTECTOMY, PARTIAL Right   . PROLAPSED UTERINE FIBROID LIGATION    . TONSILLECTOMY    . TUBAL LIGATION    . VAGINAL HYSTERECTOMY    . VESICOVAGINAL FISTULA CLOSURE       Current Meds  Medication Sig  . ALPRAZolam (XANAX) 0.5 MG tablet Take 0.5 mg by mouth daily as needed for anxiety.   . Biotin 10000 MCG TABS   . buPROPion (WELLBUTRIN SR) 150 MG 12 hr tablet Take 300 mg by mouth at bedtime.   . Calcium  Carbonate-Vitamin D (CALCIUM 600+D) 600-400 MG-UNIT tablet Take 1 tablet by mouth daily.  . Cholecalciferol (VITAMIN D3) 10 MCG (400 UNIT) CAPS Take by mouth.  . ezetimibe (ZETIA) 10 MG tablet Take 10 mg by mouth daily.   . fluticasone (FLONASE) 50 MCG/ACT nasal spray Place 1-2 sprays into both nostrils daily as needed for allergies or rhinitis.   Marland Kitchen levocetirizine (XYZAL) 5 MG tablet Take 5 mg by mouth every evening.   . linaclotide (LINZESS) 72 MCG capsule Take 72 mcg by mouth daily as needed (for constipation).   Marland Kitchen omeprazole (PRILOSEC) 40 MG capsule Take 40 mg by mouth daily before breakfast.   . tetrahydrozoline-zinc (VISINE-AC) 0.05-0.25 % ophthalmic solution Place 1-2 drops into both eyes 3 (three) times daily as needed (for irritated/itchy/allergy eyes.).     Allergies:   Patient has no known allergies.   Social History   Tobacco Use  . Smoking status: Former Smoker    Packs/day: 1.50    Years: 35.00    Pack years: 52.50    Types: Cigarettes    Quit date: 2000    Years since quitting: 21.1  . Smokeless tobacco: Never Used  Substance Use Topics  . Alcohol use: Yes    Comment: very seldom  . Drug use: No     Family Hx: The patient's family history includes Alzheimer's disease in her maternal grandmother; Cancer in her mother, sister, and sister; Diabetes in her brother, maternal grandmother, mother, sister, and sister; Heart disease in her brother, brother, father, and sister; High blood pressure in her sister. There is no history of Colon cancer.  ROS:   Please see the history of present illness.     All other systems reviewed and are negative.   Prior CV studies:   The following studies were reviewed today:  Echocardiogram on 02/17/2018 demonstrated normal left ventricular systolic function and regional wall motion, LVEF 60 to 65%, mild LVH, grade 1 diastolic dysfunction, and mild aortic and tricuspid regurgitation.  Labs/Other Tests and Data Reviewed:    EKG:   Reviewed above  Recent Labs: No results found for requested labs within last 8760 hours.   Recent Lipid Panel No results found for: CHOL, TRIG, HDL, CHOLHDL, LDLCALC, LDLDIRECT  Wt Readings from Last 3 Encounters:  01/03/20 200 lb (90.7 kg)  11/30/19 200 lb (90.7 kg)  10/25/19 201 lb 4 oz (91.3 kg)     Objective:    Vital Signs:  BP 130/78   Pulse 65   Ht 5\' 3"  (1.6 m)   Wt 200 lb (90.7 kg)   BMI 35.43 kg/m    VITAL SIGNS:  reviewed  ASSESSMENT & PLAN:    1.  Abnormal ECG/dyspnea on exertion: Echocardiogram in April 2019 demonstrated normal left ventricular systolic function and regional wall motion as noted above. Symptoms are concerning for possible cardiac etiology of symptoms have become more progressive and  frequent.  I will arrange for coronary CT angiography.  I will also obtain a follow-up echocardiogram to evaluate for interval changes in cardiac structure and function.  I will also check a CBC to evaluate for anemia.  2.  Episodic dizziness: Blood pressure is normal today.  Heart rate is also normal.  If coronary CT angiography and echocardiogram are unrevealing, I would consider an event monitor.     COVID-19 Education: The signs and symptoms of COVID-19 were discussed with the patient and how to seek care for testing (follow up with PCP or arrange E-visit).  The importance of social distancing was discussed today.  Time:   Today, I have spent 25 minutes with the patient with telehealth technology discussing the above problems.     Medication Adjustments/Labs and Tests Ordered: Current medicines are reviewed at length with the patient today.  Concerns regarding medicines are outlined above.   Tests Ordered: No orders of the defined types were placed in this encounter.   Medication Changes: No orders of the defined types were placed in this encounter.   Follow Up:  Virtual Visit  3 months  Signed, Kate Sable, MD  01/03/2020 8:53 AM    [facility]

## 2020-01-06 ENCOUNTER — Ambulatory Visit (HOSPITAL_COMMUNITY)
Admission: RE | Admit: 2020-01-06 | Discharge: 2020-01-06 | Disposition: A | Discharge status: Home / Self Care | Payer: Medicare Other | Source: Ambulatory Visit | Attending: Cardiovascular Disease | Admitting: Cardiovascular Disease

## 2020-01-06 ENCOUNTER — Other Ambulatory Visit: Payer: Self-pay

## 2020-01-06 DIAGNOSIS — R42 Dizziness and giddiness: Secondary | ICD-10-CM | POA: Diagnosis not present

## 2020-01-06 DIAGNOSIS — R0609 Other forms of dyspnea: Secondary | ICD-10-CM

## 2020-01-06 DIAGNOSIS — R0600 Dyspnea, unspecified: Secondary | ICD-10-CM | POA: Diagnosis not present

## 2020-01-06 NOTE — Progress Notes (Signed)
*  PRELIMINARY RESULTS* Echocardiogram 2D Echocardiogram has been performed.  Leavy Cella 01/06/2020, 2:59 PM

## 2020-01-08 DIAGNOSIS — C50911 Malignant neoplasm of unspecified site of right female breast: Secondary | ICD-10-CM | POA: Diagnosis not present

## 2020-01-08 DIAGNOSIS — I10 Essential (primary) hypertension: Secondary | ICD-10-CM | POA: Diagnosis not present

## 2020-01-08 DIAGNOSIS — E7849 Other hyperlipidemia: Secondary | ICD-10-CM | POA: Diagnosis not present

## 2020-01-13 ENCOUNTER — Ambulatory Visit: Payer: Medicare Other | Attending: Internal Medicine

## 2020-01-13 ENCOUNTER — Other Ambulatory Visit: Payer: Self-pay

## 2020-01-13 DIAGNOSIS — Z23 Encounter for immunization: Secondary | ICD-10-CM | POA: Insufficient documentation

## 2020-01-13 NOTE — Progress Notes (Signed)
   Covid-19 Vaccination Clinic  Name:  Lennis Dynes    MRN: [id number] DOB: 1951/04/05  01/13/2020  Ms. Traina was observed post Covid-19 immunization for 15 minutes without incidence. She was provided with Vaccine Information Sheet and instruction to access the V-Safe system.   Ms. Ayles was instructed to call 911 with any severe reactions post vaccine: Marland Kitchen Difficulty breathing  . Swelling of your face and throat  . A fast heartbeat  . A bad rash all over your body  . Dizziness and weakness    Immunizations Administered    Name Date Dose VIS Date Route   Pfizer COVID-19 Vaccine 01/13/2020 12:51 PM 0.3 mL 10/28/2019 Intramuscular   Manufacturer: [place]   Lot: [id number]   [place]: [id number]

## 2020-01-26 ENCOUNTER — Other Ambulatory Visit: Payer: Self-pay | Admitting: Cardiovascular Disease

## 2020-01-26 DIAGNOSIS — R0600 Dyspnea, unspecified: Secondary | ICD-10-CM | POA: Diagnosis not present

## 2020-01-26 DIAGNOSIS — R42 Dizziness and giddiness: Secondary | ICD-10-CM | POA: Diagnosis not present

## 2020-01-27 LAB — CBC/DIFF AMBIGUOUS DEFAULT
Basophils Absolute: 0.1 10*3/uL (ref 0.0–0.2)
Basos: 1 %
EOS (ABSOLUTE): 0.2 10*3/uL (ref 0.0–0.4)
Eos: 4 %
Hematocrit: 43.3 % (ref 34.0–46.6)
Hemoglobin: 14.3 g/dL (ref 11.1–15.9)
Immature Grans (Abs): 0 10*3/uL (ref 0.0–0.1)
Immature Granulocytes: 0 %
Lymphocytes Absolute: 1.2 10*3/uL (ref 0.7–3.1)
Lymphs: 25 %
MCH: 30.9 pg (ref 26.6–33.0)
MCHC: 33 g/dL (ref 31.5–35.7)
MCV: 94 fL (ref 79–97)
Monocytes Absolute: 0.5 10*3/uL (ref 0.1–0.9)
Monocytes: 10 %
Neutrophils Absolute: 3 10*3/uL (ref 1.4–7.0)
Neutrophils: 60 %
Platelets: 244 10*3/uL (ref 150–450)
RBC: 4.63 x10E6/uL (ref 3.77–5.28)
RDW: 12.8 % (ref 11.7–15.4)
WBC: 4.9 10*3/uL (ref 3.4–10.8)

## 2020-01-27 LAB — BASIC METABOLIC PANEL
BUN/Creatinine Ratio: 18 (ref 12–28)
BUN: 15 mg/dL (ref 8–27)
CO2: 22 mmol/L (ref 20–29)
Calcium: 9.4 mg/dL (ref 8.7–10.3)
Chloride: 105 mmol/L (ref 96–106)
Creatinine, Ser: 0.84 mg/dL (ref 0.57–1.00)
GFR calc Af Amer: 81 mL/min/{1.73_m2} (ref >59)
GFR calc non Af Amer: 71 mL/min/{1.73_m2} (ref >59)
Glucose: 94 mg/dL (ref 65–99)
Potassium: 4.6 mmol/L (ref 3.5–5.2)
Sodium: 144 mmol/L (ref 134–144)

## 2020-01-27 LAB — SPECIMEN STATUS REPORT

## 2020-02-01 ENCOUNTER — Telehealth: Payer: Self-pay | Admitting: Cardiovascular Disease

## 2020-02-01 NOTE — Telephone Encounter (Signed)
Will forward to Dr. Koneswaran as an FYI.  

## 2020-02-01 NOTE — Telephone Encounter (Signed)
Pt left message stating that she cancelled her CT and that she didn't feel the need for it at this time and could not afford it at this time

## 2020-02-02 ENCOUNTER — Ambulatory Visit: Admission: RE | Admit: 2020-02-02 | Payer: Medicare Other | Source: Ambulatory Visit

## 2020-02-14 ENCOUNTER — Ambulatory Visit: Payer: Medicare Other | Attending: Internal Medicine

## 2020-02-14 DIAGNOSIS — Z23 Encounter for immunization: Secondary | ICD-10-CM

## 2020-02-14 NOTE — Progress Notes (Signed)
   Covid-19 Vaccination Clinic  Name:  Amirrah Savarese    MRN: [id number] DOB: 15-Feb-1951  02/14/2020  Ms. Kountz was observed post Covid-19 immunization for 15 minutes without incident. She was provided with Vaccine Information Sheet and instruction to access the V-Safe system.   Ms. Monasterio was instructed to call 911 with any severe reactions post vaccine: Marland Kitchen Difficulty breathing  . Swelling of face and throat  . A fast heartbeat  . A bad rash all over body  . Dizziness and weakness   Immunizations Administered    Name Date Dose VIS Date Route   Pfizer COVID-19 Vaccine 02/14/2020 11:58 AM 0.3 mL 10/28/2019 Intramuscular   Manufacturer: [place]   Lot: [id number]   [place]: [id number]

## 2020-03-09 DIAGNOSIS — E7849 Other hyperlipidemia: Secondary | ICD-10-CM | POA: Diagnosis not present

## 2020-03-09 DIAGNOSIS — E782 Mixed hyperlipidemia: Secondary | ICD-10-CM | POA: Diagnosis not present

## 2020-03-09 DIAGNOSIS — K219 Gastro-esophageal reflux disease without esophagitis: Secondary | ICD-10-CM | POA: Diagnosis not present

## 2020-03-09 DIAGNOSIS — I10 Essential (primary) hypertension: Secondary | ICD-10-CM | POA: Diagnosis not present

## 2020-03-28 DIAGNOSIS — C50011 Malignant neoplasm of nipple and areola, right female breast: Secondary | ICD-10-CM | POA: Diagnosis not present

## 2020-04-05 ENCOUNTER — Telehealth: Payer: Medicare Other | Admitting: Cardiovascular Disease

## 2020-04-09 ENCOUNTER — Telehealth (INDEPENDENT_AMBULATORY_CARE_PROVIDER_SITE_OTHER): Payer: Medicare Other | Admitting: Cardiovascular Disease

## 2020-04-09 ENCOUNTER — Encounter: Payer: Self-pay | Admitting: Cardiovascular Disease

## 2020-04-09 VITALS — Ht 63.5 in | Wt 195.0 lb

## 2020-04-09 DIAGNOSIS — R0600 Dyspnea, unspecified: Secondary | ICD-10-CM | POA: Diagnosis not present

## 2020-04-09 DIAGNOSIS — Z01818 Encounter for other preprocedural examination: Secondary | ICD-10-CM | POA: Diagnosis not present

## 2020-04-09 DIAGNOSIS — R0609 Other forms of dyspnea: Secondary | ICD-10-CM

## 2020-04-09 NOTE — Patient Instructions (Signed)
Medication Instructions:  Continue all current medications.  Labwork: none  Testing/Procedures: none  Follow-Up: As needed.    Any Other Special Instructions Will Be Listed Below (If Applicable).  If you need a refill on your cardiac medications before your next appointment, please call your pharmacy.  

## 2020-04-09 NOTE — Progress Notes (Signed)
Virtual Visit via Telephone Note   This visit type was conducted due to national recommendations for restrictions regarding the COVID-19 Pandemic (e.g. social distancing) in an effort to limit this patient's exposure and mitigate transmission in our community.  Due to her co-morbid illnesses, this patient is at least at moderate risk for complications without adequate follow up.  This format is felt to be most appropriate for this patient at this time.  The patient did not have access to video technology/had technical difficulties with video requiring transitioning to audio format only (telephone).  All issues noted in this document were discussed and addressed.  No physical exam could be performed with this format.  Please refer to the patient's chart for her  consent to telehealth for [facility].   The patient was identified using 2 identifiers.  Date:  04/09/2020   ID:  Kathy Evans, DOB [birth year], MRN [id number]  Patient Location: Home Provider Location: Office  PCP:  Celene Squibb, MD  Cardiologist:  Kate Sable, MD  Electrophysiologist:  None   Evaluation Performed:  Follow-Up Visit  Chief Complaint: Dyspnea on exertion  History of Present Illness:    Kathy Evans is a 69 y.o. female with dyspnea on exertion.  At her visit on 01/03/2020 I ordered coronary CT angiography.  She canceled it and stated that she did not feel the need for and could not afford it.  She said her shortness of breath has disappeared altogether. She denies chest pain and dizziness.  She swims 3 days per week.  She recently returned from [place] in [place] where she visited her family.  She is seeing her orthopedic surgeon tomorrow and is thinking about getting knee replacement surgery sometime in the fall.   Family history:Her father died of an MI at the age of 24. Both her brothers developed coronary disease in their 39s. One has a stent in 1 underwent bypass surgery and also had  an atrial fibrillation ablation. Her sister had a coronary artery stent at the age of 91.  Social history: She is married. Her husband Kennith [place]) isalsomy patient. [place] She has a stepson with schizophrenia and bipolar disorder.   Past Medical History:  Diagnosis Date  . Acid reflux   . Arthritis   . Breast cancer ([place]) 03/2017   right  . Breast disorder    cancer  . Bronchitis   . Depression   . GERD (gastroesophageal reflux disease)   . Hyperlipidemia   . IBS (irritable bowel syndrome)   . Impingement syndrome of left shoulder   . Personal history of radiation therapy 2018   Past Surgical History:  Procedure Laterality Date  . ADENOIDECTOMY    . APPENDECTOMY    . BREAST BIOPSY Left    benign  . BREAST LUMPECTOMY Right 2018  . BUNIONECTOMY Right   . CARDIAC CATHETERIZATION    . CATARACT EXTRACTION, BILATERAL    . CHOLECYSTECTOMY    . colon obstruction     lysis of adhesions (no colon or small bowel resections)   . COLONOSCOPY WITH PROPOFOL N/A 02/02/2018   Dr. Oneida Alar: multiple small and large-mouthed diverticula in recto-sigmoid colon, sigmoid, and descending. TI normal. Internal hemorrhoids during retroflexion, small. Moderate external hemorrhoids  . full mastectomy Right   . MASTECTOMY, PARTIAL Right   . PROLAPSED UTERINE FIBROID LIGATION    . TONSILLECTOMY    . TUBAL LIGATION    . VAGINAL HYSTERECTOMY    . VESICOVAGINAL FISTULA  CLOSURE       Current Meds  Medication Sig  . ALPRAZolam (XANAX) 0.5 MG tablet Take 0.5 mg by mouth daily as needed for anxiety.   . Biotin 10000 MCG TABS daily.   Marland Kitchen buPROPion (WELLBUTRIN XL) 300 MG 24 hr tablet Take 300 mg by mouth at bedtime.  . Cholecalciferol (VITAMIN D3) 10 MCG (400 UNIT) CAPS Take 1 capsule by mouth daily.   Marland Kitchen ezetimibe (ZETIA) 10 MG tablet Take 10 mg by mouth daily.   . fluticasone (FLONASE) 50 MCG/ACT nasal spray Place 1-2 sprays into both nostrils daily as  needed for allergies or rhinitis.   Marland Kitchen levocetirizine (XYZAL) 5 MG tablet Take 5 mg by mouth in the morning.   Marland Kitchen omeprazole (PRILOSEC) 40 MG capsule Take 40 mg by mouth daily before breakfast.   . tetrahydrozoline-zinc (VISINE-AC) 0.05-0.25 % ophthalmic solution Place 1-2 drops into both eyes 3 (three) times daily as needed (for irritated/itchy/allergy eyes.).     Allergies:   Patient has no known allergies.   Social History   Tobacco Use  . Smoking status: Former Smoker    Packs/day: 1.50    Years: 35.00    Pack years: 52.50    Types: Cigarettes    Quit date: 2000    Years since quitting: 21.4  . Smokeless tobacco: Never Used  Substance Use Topics  . Alcohol use: Yes    Comment: very seldom  . Drug use: No     Family Hx: The patient's family history includes Alzheimer's disease in her maternal grandmother; Cancer in her mother, sister, and sister; Diabetes in her brother, maternal grandmother, mother, sister, and sister; Heart disease in her brother, brother, father, and sister; High blood pressure in her sister. There is no history of Colon cancer.  ROS:   Please see the history of present illness.     All other systems reviewed and are negative.   Prior CV studies:   The following studies were reviewed today:  Echocardiogram 01/06/2020:  1. Left ventricular ejection fraction, by estimation, is 55 to 60%. The  left ventricle has normal function. The left ventricle has no regional  wall motion abnormalities. There is mild left ventricular hypertrophy.  Left ventricular diastolic parameters  are consistent with Grade I diastolic dysfunction (impaired relaxation).  2. Right ventricular systolic function is normal. The right ventricular  size is normal. There is normal pulmonary artery systolic pressure. The  estimated right ventricular systolic pressure is 123456 mmHg.  3. The mitral valve is grossly normal. Trivial mitral valve  regurgitation.  4. The aortic valve is  tricuspid. Aortic valve regurgitation is mild.  5. Aortic dilatation noted. There is mild dilatation of the ascending  aorta.  6. The inferior vena cava is normal in size with greater than 50%  respiratory variability, suggesting right atrial pressure of 3 mmHg.   Labs/Other Tests and Data Reviewed:    EKG:  No ECG reviewed.  Recent Labs: 01/26/2020: BUN 15; Creatinine, Ser 0.84; Hemoglobin 14.3; Platelets 244; Potassium 4.6; Sodium 144   Recent Lipid Panel No results found for: CHOL, TRIG, HDL, CHOLHDL, LDLCALC, LDLDIRECT  Wt Readings from Last 3 Encounters:  04/09/20 195 lb (88.5 kg)  01/03/20 200 lb (90.7 kg)  11/30/19 200 lb (90.7 kg)     Objective:    Vital Signs:  Ht 5' 3.5" (1.613 m)   Wt 195 lb (88.5 kg)   BMI 34.00 kg/m    VITAL SIGNS:  reviewed  ASSESSMENT &  PLAN:    1.  Dyspnea on exertion: Symptoms have completely resolved.  Echocardiogram demonstrated normal LV systolic function and grade 1 diastolic dysfunction with only mild aortic regurgitation.  No further cardiac testing is indicated at this time.  She is swimming 3 times per week without any exertional symptoms whatsoever.  2.  Preoperative risk stratification: She is swimming 3 days/week without any exertional symptoms whatsoever.  She has normal LV systolic function.  She can proceed with knee replacement surgery as planned without any further cardiac testing.    COVID-19 Education: The signs and symptoms of COVID-19 were discussed with the patient and how to seek care for testing (follow up with PCP or arrange E-visit).  The importance of social distancing was discussed today.  Time:   Today, I have spent 15 minutes with the patient with telehealth technology discussing the above problems.     Medication Adjustments/Labs and Tests Ordered: Current medicines are reviewed at length with the patient today.  Concerns regarding medicines are outlined above.   Tests Ordered: No orders of the defined  types were placed in this encounter.   Medication Changes: No orders of the defined types were placed in this encounter.   Follow Up:  Virtual Visit  prn  Signed, Kate Sable, MD  04/09/2020 4:40 PM    [facility]

## 2020-04-10 DIAGNOSIS — M25561 Pain in right knee: Secondary | ICD-10-CM | POA: Diagnosis not present

## 2020-04-23 DIAGNOSIS — E782 Mixed hyperlipidemia: Secondary | ICD-10-CM | POA: Diagnosis not present

## 2020-04-23 DIAGNOSIS — E7849 Other hyperlipidemia: Secondary | ICD-10-CM | POA: Diagnosis not present

## 2020-04-23 DIAGNOSIS — I10 Essential (primary) hypertension: Secondary | ICD-10-CM | POA: Diagnosis not present

## 2020-04-23 DIAGNOSIS — K219 Gastro-esophageal reflux disease without esophagitis: Secondary | ICD-10-CM | POA: Diagnosis not present

## 2020-04-24 ENCOUNTER — Other Ambulatory Visit (HOSPITAL_COMMUNITY): Payer: Medicare Other

## 2020-04-27 DIAGNOSIS — M1711 Unilateral primary osteoarthritis, right knee: Secondary | ICD-10-CM | POA: Diagnosis not present

## 2020-04-30 ENCOUNTER — Other Ambulatory Visit (HOSPITAL_COMMUNITY): Payer: Self-pay | Admitting: *Deleted

## 2020-04-30 ENCOUNTER — Other Ambulatory Visit (HOSPITAL_COMMUNITY): Payer: Self-pay | Admitting: Hematology

## 2020-04-30 ENCOUNTER — Inpatient Hospital Stay (HOSPITAL_COMMUNITY): Payer: Medicare Other | Attending: Hematology | Admitting: Hematology

## 2020-04-30 ENCOUNTER — Other Ambulatory Visit: Payer: Self-pay

## 2020-04-30 VITALS — BP 135/67 | HR 68 | Temp 98.9°F | Resp 18 | Wt 204.6 lb

## 2020-04-30 DIAGNOSIS — E669 Obesity, unspecified: Secondary | ICD-10-CM | POA: Insufficient documentation

## 2020-04-30 DIAGNOSIS — Z853 Personal history of malignant neoplasm of breast: Secondary | ICD-10-CM | POA: Diagnosis not present

## 2020-04-30 DIAGNOSIS — Z170 Estrogen receptor positive status [ER+]: Secondary | ICD-10-CM

## 2020-04-30 DIAGNOSIS — Z932 Ileostomy status: Secondary | ICD-10-CM | POA: Insufficient documentation

## 2020-04-30 DIAGNOSIS — M25569 Pain in unspecified knee: Secondary | ICD-10-CM | POA: Diagnosis not present

## 2020-04-30 DIAGNOSIS — Z13820 Encounter for screening for osteoporosis: Secondary | ICD-10-CM

## 2020-04-30 DIAGNOSIS — M8580 Other specified disorders of bone density and structure, unspecified site: Secondary | ICD-10-CM | POA: Diagnosis not present

## 2020-04-30 DIAGNOSIS — C50911 Malignant neoplasm of unspecified site of right female breast: Secondary | ICD-10-CM

## 2020-04-30 NOTE — Patient Instructions (Addendum)
[place] at [facility] Discharge Instructions  You were seen today by Dr. Delton Coombes. He went over your recent results. You will be scheduled for a breast mammogram and a bone density scan in December. Please continue your routine care with your primary care provider. Dr. Delton Coombes will see you back in 6 months for labs and follow up.   Thank you for choosing [place] at [facility] to provide your oncology and hematology care.  To afford each patient quality time with our provider, please arrive at least 15 minutes before your scheduled appointment time.   If you have a lab appointment with the [place] please come in thru the Main Entrance and check in at the main information desk  You need to re-schedule your appointment should you arrive 10 or more minutes late.  We strive to give you quality time with our providers, and arriving late affects you and other patients whose appointments are after yours.  Also, if you no show three or more times for appointments you may be dismissed from the clinic at the providers discretion.     Again, thank you for choosing [facility].  Our hope is that these requests will decrease the amount of time that you wait before being seen by our physicians.       _____________________________________________________________  Should you have questions after your visit to [facility], please contact our office at [phone] between the hours of 8:00 a.m. and 4:30 p.m.  Voicemails left after 4:00 p.m. will not be returned until the following business day.  For prescription refill requests, have your pharmacy contact our office and allow 72 hours.    Cancer Center Support Programs:   > Cancer Support Group  2nd Tuesday of the month 1pm-2pm, Journey Room

## 2020-04-30 NOTE — Progress Notes (Signed)
[place] [address], [place] [address]   Patient Care Team: Celene Squibb, MD as PCP - General (Internal Medicine) Herminio Commons, MD as PCP - Cardiology (Cardiology) Danie Binder, MD (Inactive) as Consulting Physician (Gastroenterology)  SUMMARY OF ONCOLOGIC HISTORY: Oncology History   No history exists.    CHIEF COMPLIANT: Right breast cancer follow-up.   INTERVAL HISTORY: [name] is a 69 y.o. female here today for follow up of her breast cancer. Her last visit was on 04/21/2019.  Today she reports no issues, with the exception of her knee pain. She stopped taking the calcium and vitamin D last month. She will have blood work done on Friday, 05/04/2020.   REVIEW OF SYSTEMS:   Review of Systems  Constitutional: Positive for fatigue (moderate). Negative for appetite change.  Gastrointestinal: Positive for constipation (occasional).  All other systems reviewed and are negative.   I have reviewed the past medical history, past surgical history, social history and family history with the patient and they are unchanged from previous note.   ALLERGIES:   has No Known Allergies.   MEDICATIONS:  Current Outpatient Medications  Medication Sig Dispense Refill  . Biotin 10000 MCG TABS daily.     Marland Kitchen buPROPion (WELLBUTRIN XL) 300 MG 24 hr tablet Take 300 mg by mouth at bedtime.    Marland Kitchen ezetimibe (ZETIA) 10 MG tablet Take 10 mg by mouth daily.     Marland Kitchen levocetirizine (XYZAL) 5 MG tablet Take 5 mg by mouth in the morning.     Marland Kitchen omeprazole (PRILOSEC) 40 MG capsule Take 40 mg by mouth daily before breakfast.     . ALPRAZolam (XANAX) 0.5 MG tablet Take 0.5 mg by mouth daily as needed for anxiety.  (Patient not taking: Reported on 04/30/2020)    . fluticasone (FLONASE) 50 MCG/ACT nasal spray Place 1-2 sprays into both nostrils daily as needed for allergies or rhinitis.  (Patient not taking: Reported on 04/30/2020)    . tetrahydrozoline-zinc  (VISINE-AC) 0.05-0.25 % ophthalmic solution Place 1-2 drops into both eyes 3 (three) times daily as needed (for irritated/itchy/allergy eyes.). (Patient not taking: Reported on 04/30/2020)     No current facility-administered medications for this visit.     PHYSICAL EXAMINATION: Performance status (ECOG): 1 - Symptomatic but completely ambulatory  Vitals:   04/30/20 1459  BP: 135/67  Pulse: 68  Resp: 18  Temp: 98.9 F (37.2 C)  SpO2: 98%   Wt Readings from Last 3 Encounters:  04/30/20 204 lb 9.6 oz (92.8 kg)  04/09/20 195 lb (88.5 kg)  01/03/20 200 lb (90.7 kg)   Physical Exam Vitals reviewed.  Constitutional:      Appearance: Normal appearance. She is obese.  Cardiovascular:     Rate and Rhythm: Normal rate and regular rhythm.     Pulses: Normal pulses.     Heart sounds: Normal heart sounds.  Pulmonary:     Effort: Pulmonary effort is normal.     Breath sounds: Normal breath sounds.  Chest:     Breasts:        Right: No tenderness.        Left: No tenderness.  Abdominal:     Palpations: Abdomen is soft. There is no mass.     Tenderness: There is no abdominal tenderness.  Musculoskeletal:     Right lower leg: No edema.     Left lower leg: No edema.  Lymphadenopathy:     Upper Body:  Right upper body: No supraclavicular or pectoral adenopathy.     Left upper body: No supraclavicular or pectoral adenopathy.  Neurological:     General: No focal deficit present.     Mental Status: She is alert and oriented to person, place, and time.  Psychiatric:        Mood and Affect: Mood normal.        Behavior: Behavior normal.     Breast Exam Chaperone: Milinda Antis, MD     LABORATORY DATA:  I have reviewed the data as listed CMP Latest Ref Rng & Units 01/26/2020 02/16/2018 01/27/2018  Glucose 65 - 99 mg/dL 94 84 94  BUN 8 - 27 mg/dL 15 13 14   Creatinine 0.57 - 1.00 mg/dL 0.84 0.83 0.81  Sodium 134 - 144 mmol/L 144 141 140  Potassium 3.5 - 5.2 mmol/L 4.6 4.1 4.0   Chloride 96 - 106 mmol/L 105 105 106  CO2 20 - 29 mmol/L 22 24 24   Calcium 8.7 - 10.3 mg/dL 9.4 9.3 9.3  Total Protein 6.5 - 8.1 g/dL - 7.4 -  Total Bilirubin 0.3 - 1.2 mg/dL - 0.5 -  Alkaline Phos 38 - 126 U/L - 90 -  AST 15 - 41 U/L - 19 -  ALT 14 - 54 U/L - 18 -   No results found for: ZOX096 Lab Results  Component Value Date   WBC 4.9 01/26/2020   HGB 14.3 01/26/2020   HCT 43.3 01/26/2020   MCV 94 01/26/2020   PLT 244 01/26/2020   NEUTROABS 3.0 01/26/2020    ASSESSMENT: 1.  Stage Ia right breast IDC: -Status post lumpectomy and SLNB on 04/30/2017 in [place].  Completed adjuvant radiation on 07/08/2017. -Arimidex started on 06/03/2017, discontinued secondary to side effects.  Femara was also discontinued secondary to dry vaginitis. -Right breast biopsy on 10/12/2018 shows scant benign adipose tissue with fibrosis with no malignancy. -Mammogram on 10/18/2019 was BI-RADS Category 2.  2.  Osteopenia: -Bone density on 08/21/2017 shows T score -1.1.   PLAN:  1.  Stage Ia right breast IDC: -Physical exam today did not reveal any palpable masses or adenopathy.  Lumpectomy scar is within normal limits. -She could not tolerate any antiestrogen therapy. -Labs from 01/26/2020 did not show any abnormalities. -She would like to have labs done with Dr. Nevada Crane. -We will plan to see her back in 6 months for follow-up.  I will repeat her mammogram.  2.  Osteopenia: -She stopped taking calcium and vitamin D supplements.  She reports that her last vitamin D was normal. -We plan to repeat DEXA scan prior to next visit.    No orders of the defined types were placed in this encounter.  The patient has a good understanding of the overall plan. she agrees with it. she will call with any problems that may develop before the next visit here.    Derek Jack, MD [place] [phone]   I, Milinda Antis, am acting as a scribe for Dr. Sanda Linger.  I,  Derek Jack MD, have reviewed the above documentation for accuracy and completeness, and I agree with the above.

## 2020-05-01 ENCOUNTER — Ambulatory Visit (HOSPITAL_COMMUNITY): Payer: Medicare Other | Admitting: Hematology

## 2020-05-04 DIAGNOSIS — M1711 Unilateral primary osteoarthritis, right knee: Secondary | ICD-10-CM | POA: Diagnosis not present

## 2020-05-08 DIAGNOSIS — M1711 Unilateral primary osteoarthritis, right knee: Secondary | ICD-10-CM | POA: Diagnosis not present

## 2020-05-14 DIAGNOSIS — C50911 Malignant neoplasm of unspecified site of right female breast: Secondary | ICD-10-CM | POA: Diagnosis not present

## 2020-05-14 DIAGNOSIS — E782 Mixed hyperlipidemia: Secondary | ICD-10-CM | POA: Diagnosis not present

## 2020-05-14 DIAGNOSIS — E7849 Other hyperlipidemia: Secondary | ICD-10-CM | POA: Diagnosis not present

## 2020-05-14 DIAGNOSIS — M8580 Other specified disorders of bone density and structure, unspecified site: Secondary | ICD-10-CM | POA: Diagnosis not present

## 2020-05-16 DIAGNOSIS — Z0001 Encounter for general adult medical examination with abnormal findings: Secondary | ICD-10-CM | POA: Diagnosis not present

## 2020-05-16 DIAGNOSIS — M8580 Other specified disorders of bone density and structure, unspecified site: Secondary | ICD-10-CM | POA: Diagnosis not present

## 2020-05-16 DIAGNOSIS — K219 Gastro-esophageal reflux disease without esophagitis: Secondary | ICD-10-CM | POA: Diagnosis not present

## 2020-05-16 DIAGNOSIS — E782 Mixed hyperlipidemia: Secondary | ICD-10-CM | POA: Diagnosis not present

## 2020-05-28 DIAGNOSIS — C50011 Malignant neoplasm of nipple and areola, right female breast: Secondary | ICD-10-CM | POA: Diagnosis not present

## 2020-07-10 ENCOUNTER — Encounter: Payer: Self-pay | Admitting: Cardiology

## 2020-07-10 NOTE — Progress Notes (Signed)
Cardiology Office Note  Date: 07/11/2020   ID: Kathy Evans, [name] 29-May-1951, MRN [id number]  PCP:  Celene Squibb, MD  Cardiologist:  Rozann Lesches, MD Electrophysiologist:  None   Chief Complaint  Patient presents with  . Cardiac follow-up    History of Present Illness: Kathy Evans is a 69 y.o. female patient of Dr. Bronson Ing now presenting to establish follow-up with me.  I reviewed her records and updated the chart.  She was last assessed via telehealth encounter in May.  She presents today reporting recurring exertional fatigue and shortness of breath.  She was evaluated for the same symptoms by Dr. Bronson Ing with prior plan to pursue cardiac CTA, however this was ultimately canceled as her symptoms had improved temporarily.    She states that over the last few months she has been experiencing exertional fatigue and shortness of breath with fairly low level activity, getting up off the couch to walk short distance in her house, sometimes getting ready for bed.  On the other hand, she also tells me that she swims at the [facility] 3 days a week, uses a snorkel and swims 12 laps.  She does not necessarily feel short of breath with her swimming.  No feeling of chest tightness.  She does feel an occasional "thumping" sensation in her chest.  She has had no syncope.  Follow-up echocardiogram in February reported LVEF 55 to 60% with mild diastolic dysfunction, normal RV contraction and estimated PASP, mildly dilated aortic root and ascending aorta with mild aortic regurgitation.  She has a significant family history of heart disease.  She has also undergone cardiac catheterization in the past, reportedly without significant blockages as of 2013.  I personally reviewed her ECG today which shows normal sinus rhythm with nonspecific T wave changes.  Past Medical History:  Diagnosis Date  . Arthritis   . Breast cancer ([place]) 03/2017   Right  . Bronchitis   . Depression   . GERD  (gastroesophageal reflux disease)   . Hyperlipidemia   . IBS (irritable bowel syndrome)   . Impingement syndrome of left shoulder   . Personal history of radiation therapy 2018    Past Surgical History:  Procedure Laterality Date  . ADENOIDECTOMY    . APPENDECTOMY    . BREAST BIOPSY Left    benign  . BREAST LUMPECTOMY Right 2018  . BUNIONECTOMY Right   . CARDIAC CATHETERIZATION    . CATARACT EXTRACTION, BILATERAL    . CHOLECYSTECTOMY    . COLONOSCOPY WITH PROPOFOL N/A 02/02/2018   Dr. Oneida Alar: multiple small and large-mouthed diverticula in recto-sigmoid colon, sigmoid, and descending. TI normal. Internal hemorrhoids during retroflexion, small. Moderate external hemorrhoids  . Full mastectomy Right   . MASTECTOMY, PARTIAL Right   . PROLAPSED UTERINE FIBROID LIGATION    . TONSILLECTOMY    . TUBAL LIGATION    . VAGINAL HYSTERECTOMY    . VESICOVAGINAL FISTULA CLOSURE      Current Outpatient Medications  Medication Sig Dispense Refill  . ALPRAZolam (XANAX) 0.5 MG tablet Take 0.5 mg by mouth daily as needed for anxiety.     . Biotin 10000 MCG TABS daily.     Marland Kitchen buPROPion (WELLBUTRIN XL) 300 MG 24 hr tablet Take 300 mg by mouth at bedtime.    . Cholecalciferol (VITAMIN D3) 125 MCG (5000 UT) CAPS Take 1 capsule by mouth daily.    Marland Kitchen ezetimibe (ZETIA) 10 MG tablet Take 10 mg by mouth daily.     Marland Kitchen  fluticasone (FLONASE) 50 MCG/ACT nasal spray Place 1-2 sprays into both nostrils daily as needed for allergies or rhinitis.     Marland Kitchen levocetirizine (XYZAL) 5 MG tablet Take 5 mg by mouth in the morning.     Marland Kitchen omeprazole (PRILOSEC) 40 MG capsule Take 40 mg by mouth daily before breakfast.     . tetrahydrozoline-zinc (VISINE-AC) 0.05-0.25 % ophthalmic solution Place 1-2 drops into both eyes 3 (three) times daily as needed (for irritated/itchy/allergy eyes.).      No current facility-administered medications for this visit.   Allergies:  Patient has no known allergies.   Social History: The  patient  reports that she quit smoking about 21 years ago. Her smoking use included cigarettes. She has a 52.50 pack-year smoking history. She has never used smokeless tobacco. She reports current alcohol use. She reports that she does not use drugs.   Family History: The patient's family history includes Alzheimer's disease in her maternal grandmother; Cancer in her mother, sister, and sister; Diabetes in her brother, maternal grandmother, mother, sister, and sister; Heart disease in her brother, brother, father, and sister; High blood pressure in her sister.   ROS:   No syncope.  Arthritic right knee pain.  Physical Exam: VS:  BP 130/80   Pulse 61   Ht 5\' 3"  (1.6 m)   Wt 209 lb 3.2 oz (94.9 kg)   SpO2 96%   BMI 37.06 kg/m , BMI Body mass index is 37.06 kg/m.  Wt Readings from Last 3 Encounters:  07/11/20 209 lb 3.2 oz (94.9 kg)  04/30/20 204 lb 9.6 oz (92.8 kg)  04/09/20 195 lb (88.5 kg)    General: Patient appears comfortable at rest. HEENT: Conjunctiva and lids normal, wearing a mask. Neck: Supple, no elevated JVP or carotid bruits, no thyromegaly. Lungs: Clear to auscultation, nonlabored breathing at rest. Cardiac: Regular rate and rhythm, no S3 or significant systolic murmur, no pericardial rub. Extremities: No pitting edema, distal pulses 2+.  ECG:  An ECG dated 08/31/2019 was personally reviewed today and demonstrated:  Sinus bradycardia with nonspecific T wave changes.  Recent Labwork: 01/26/2020: BUN 15; Creatinine, Ser 0.84; Hemoglobin 14.3; Platelets 244; Potassium 4.6; Sodium 144   Other Studies Reviewed Today:  Echocardiogram 01/06/2020: 1. Left ventricular ejection fraction, by estimation, is 55 to 60%. The  left ventricle has normal function. The left ventricle has no regional  wall motion abnormalities. There is mild left ventricular hypertrophy.  Left ventricular diastolic parameters  are consistent with Grade I diastolic dysfunction (impaired relaxation).    2. Right ventricular systolic function is normal. The right ventricular  size is normal. There is normal pulmonary artery systolic pressure. The  estimated right ventricular systolic pressure is 08.1 mmHg.  3. The mitral valve is grossly normal. Trivial mitral valve  regurgitation.  4. The aortic valve is tricuspid. Aortic valve regurgitation is mild.  5. Aortic dilatation noted. There is mild dilatation of the ascending  aorta.  6. The inferior vena cava is normal in size with greater than 50%  respiratory variability, suggesting right atrial pressure of 3 mmHg.  Assessment and Plan:  1.  Exertional fatigue and shortness of breath as outlined above in a 69 year old woman with family history of heart disease, also personal history of hyperlipidemia.  ECG is nonspecific.  She had already been scheduled to undergo a cardiac CTA by Dr. Bronson Ing, however this was canceled in May due to temporary improvement in symptoms.  In light of recurrence, plan will  be to proceed with cardiac CTA, she prefers to have this done in [place].  Echocardiography from earlier in the year shows normal LVEF at 55 to 60% with mild diastolic dysfunction, normal estimated PASP.  2.  Mixed hyperlipidemia by history, she is on Zetia at this time.  She follows with Dr. Nevada Crane.  Medication Adjustments/Labs and Tests Ordered: Current medicines are reviewed at length with the patient today.  Concerns regarding medicines are outlined above.   Tests Ordered: Orders Placed This Encounter  Procedures  . CT CORONARY MORPH W/CTA COR W/SCORE W/CA W/CM &/OR WO/CM  . CT CORONARY FRACTIONAL FLOW RESERVE DATA PREP  . CT CORONARY FRACTIONAL FLOW RESERVE FLUID ANALYSIS  . Basic metabolic panel  . EKG 12-Lead    Medication Changes: No orders of the defined types were placed in this encounter.   Disposition:  Follow up test results.  Signed, Satira Sark, MD, [facility] 07/11/2020 11:06 AM    [facility] [place], [place], [place] [address] Phone: [phone]; Fax: [phone]

## 2020-07-11 ENCOUNTER — Encounter: Payer: Self-pay | Admitting: Cardiology

## 2020-07-11 ENCOUNTER — Ambulatory Visit: Payer: Medicare Other | Admitting: Cardiology

## 2020-07-11 VITALS — BP 130/80 | HR 61 | Ht 63.0 in | Wt 209.2 lb

## 2020-07-11 DIAGNOSIS — Z8249 Family history of ischemic heart disease and other diseases of the circulatory system: Secondary | ICD-10-CM

## 2020-07-11 DIAGNOSIS — E782 Mixed hyperlipidemia: Secondary | ICD-10-CM

## 2020-07-11 DIAGNOSIS — R0600 Dyspnea, unspecified: Secondary | ICD-10-CM | POA: Diagnosis not present

## 2020-07-11 DIAGNOSIS — R0609 Other forms of dyspnea: Secondary | ICD-10-CM

## 2020-07-11 DIAGNOSIS — Z87898 Personal history of other specified conditions: Secondary | ICD-10-CM

## 2020-07-11 MED ORDER — METOPROLOL TARTRATE 100 MG PO TABS: 100.0000 mg | ORAL_TABLET | Freq: Once | ORAL | 0 refills | Status: DC

## 2020-07-11 NOTE — Patient Instructions (Addendum)
Medication Instructions:   Your physician recommends that you continue on your current medications as directed. Please refer to the Current Medication list given to you today.  Labwork:  Your physician recommends that you return for lab work in: 1-2 weeks before your cardiac cta. This may be done at [facility] or [facility] ([place]) Monday-Friday from 8:00 am - 4:00 pm. No appointment is needed.  Testing/Procedures:  Cardiac CTA-see instructions below  Follow-Up:  Your physician recommends that you schedule a follow-up appointment in: pending  Any Other Special Instructions Will Be Listed Below (If Applicable).  If you need a refill on your cardiac medications before your next appointment, please call your pharmacy.  Your cardiac CT will be scheduled at one of the below locations:    [facility] [address] [place],  [address] [phone]   If scheduled at [facility], please arrive 15 mins early for check-in and test prep.  Please follow these instructions carefully (unless otherwise directed):   On the Night Before the Test:  Be sure to Drink plenty of water.  Do not consume any caffeinated/decaffeinated beverages or chocolate 12 hours prior to your test.  Do not take any antihistamines 12 hours prior to your test (levocetirizine or xyzal)  If the patient has contrast allergy: -No allergy to contrast  On the Day of the Test:  Drink plenty of water. Do not drink any water within one hour of the test.  Do not eat any food 4 hours prior to the test.  You may take your regular medications prior to the test.   Take metoprolol (Lopressor) 100 mg two hours prior to test.  FEMALES- please wear underwire-free bra if available       After the Test:  Drink plenty of water.  After receiving IV contrast, you may experience a mild flushed feeling. This is  normal.  On occasion, you may experience a mild rash up to 24 hours after the test. This is not dangerous. If this occurs, you can take Benadryl 25 mg and increase your fluid intake.  If you experience trouble breathing, this can be serious. If it is severe call 911 IMMEDIATELY. If it is mild, please call our office.   Once we have confirmed authorization from your insurance company, we will call you to set up a date and time for your test. Based on how quickly your insurance processes prior authorizations requests, please allow up to 4 weeks to be contacted for scheduling your Cardiac CT appointment. Be advised that routine Cardiac CT appointments could be scheduled as many as 8 weeks after your provider has ordered it.  For non-scheduling related questions, please contact the cardiac imaging nurse navigator should you have any questions/concerns: Marchia Bond, Cardiac Imaging Nurse Navigator Burley Saver, Interim Cardiac Imaging Nurse [place] [facility] Services Direct Office Dial: [phone]   For scheduling needs, including cancellations and rescheduling, please call Vivien Rota at [phone], option 3.

## 2020-07-24 ENCOUNTER — Other Ambulatory Visit: Payer: Self-pay | Admitting: Cardiology

## 2020-07-24 DIAGNOSIS — R0600 Dyspnea, unspecified: Secondary | ICD-10-CM | POA: Diagnosis not present

## 2020-07-24 DIAGNOSIS — Z87898 Personal history of other specified conditions: Secondary | ICD-10-CM | POA: Diagnosis not present

## 2020-07-24 LAB — BASIC METABOLIC PANEL WITH GFR
BUN: 11 mg/dL (ref 7–25)
CO2: 28 mmol/L (ref 20–32)
Calcium: 9.4 mg/dL (ref 8.6–10.4)
Chloride: 104 mmol/L (ref 98–110)
Creat: 0.87 mg/dL (ref 0.60–0.93)
GFR, Est African American: 78 mL/min/{1.73_m2} (ref >=60)
GFR, Est Non African American: 67 mL/min/{1.73_m2} (ref >=60)
Glucose, Bld: 83 mg/dL (ref 65–139)
Potassium: 4.4 mmol/L (ref 3.5–5.3)
Sodium: 141 mmol/L (ref 135–146)

## 2020-07-25 ENCOUNTER — Telehealth (HOSPITAL_COMMUNITY): Payer: Self-pay | Admitting: Emergency Medicine

## 2020-07-25 NOTE — Telephone Encounter (Signed)
Reaching out to patient to offer assistance regarding upcoming cardiac imaging study; pt verbalizes understanding of appt date/time, parking situation and where to check in, pre-test NPO status and medications ordered, and verified current allergies; name and call back number provided for further questions should they arise Marchia Bond RN Navigator Cardiac Imaging [place] [facility] [phone] office [phone] cell   Instructed patient to check BP/HR 2 hr prior to scan and ONLY take metoprolol if HR > 60 bpm. Pt verbalized understanding

## 2020-07-26 ENCOUNTER — Telehealth: Payer: Self-pay | Admitting: *Deleted

## 2020-07-26 ENCOUNTER — Other Ambulatory Visit: Payer: Self-pay

## 2020-07-26 ENCOUNTER — Ambulatory Visit
Admission: RE | Admit: 2020-07-26 | Discharge: 2020-07-26 | Disposition: A | Discharge status: Home / Self Care | Payer: Medicare Other | Source: Ambulatory Visit | Attending: Cardiology | Admitting: Cardiology

## 2020-07-26 DIAGNOSIS — R5383 Other fatigue: Secondary | ICD-10-CM | POA: Insufficient documentation

## 2020-07-26 DIAGNOSIS — R0609 Other forms of dyspnea: Secondary | ICD-10-CM

## 2020-07-26 DIAGNOSIS — R0600 Dyspnea, unspecified: Secondary | ICD-10-CM | POA: Insufficient documentation

## 2020-07-26 DIAGNOSIS — Z87898 Personal history of other specified conditions: Secondary | ICD-10-CM | POA: Diagnosis not present

## 2020-07-26 DIAGNOSIS — R0602 Shortness of breath: Secondary | ICD-10-CM | POA: Diagnosis not present

## 2020-07-26 DIAGNOSIS — Z8249 Family history of ischemic heart disease and other diseases of the circulatory system: Secondary | ICD-10-CM | POA: Insufficient documentation

## 2020-07-26 MED ORDER — IOHEXOL 350 MG/ML SOLN
100.0000 mL | Freq: Once | INTRAVENOUS | Status: AC | PRN
  Administered 2020-07-26: 90 mL via INTRAVENOUS

## 2020-07-26 MED ORDER — NITROGLYCERIN 0.4 MG SL SUBL
0.8000 mg | SUBLINGUAL_TABLET | Freq: Once | SUBLINGUAL | Status: AC
  Administered 2020-07-26: 0.8 mg via SUBLINGUAL

## 2020-07-26 NOTE — Telephone Encounter (Signed)
-----   Message from Satira Sark, MD sent at 07/25/2020  7:47 AM EDT ----- Results reviewed.  Normal renal function and potassium.

## 2020-07-26 NOTE — Progress Notes (Signed)
Patient tolerated procedure well. Ambulate w/o difficulty. Sitting in chair drinking. No needs. All questions answered. ABC intact. Discharge from procedure area w/o issues. Encouraged to drink extra water today.

## 2020-07-26 NOTE — Telephone Encounter (Signed)
Patient informed. Copy sent to PCP °

## 2020-07-26 NOTE — Telephone Encounter (Signed)
-----   Message from Satira Sark, MD sent at 07/26/2020  3:20 PM EDT ----- Results reviewed.  Please let her know that the cardiac CTA was very reassuring.  Calcium score is 0 and she had no significant evidence of CAD.  Also no significant extracardiac findings.  This would suggest that her symptoms are noncardiac.  Would recommend continued follow-up with Dr. Nevada Crane for further evaluation.

## 2020-08-09 DIAGNOSIS — Z1283 Encounter for screening for malignant neoplasm of skin: Secondary | ICD-10-CM | POA: Diagnosis not present

## 2020-08-09 DIAGNOSIS — L650 Telogen effluvium: Secondary | ICD-10-CM | POA: Diagnosis not present

## 2020-08-09 DIAGNOSIS — D225 Melanocytic nevi of trunk: Secondary | ICD-10-CM | POA: Diagnosis not present

## 2020-08-09 DIAGNOSIS — K219 Gastro-esophageal reflux disease without esophagitis: Secondary | ICD-10-CM | POA: Diagnosis not present

## 2020-08-09 DIAGNOSIS — I10 Essential (primary) hypertension: Secondary | ICD-10-CM | POA: Diagnosis not present

## 2020-08-09 DIAGNOSIS — E7849 Other hyperlipidemia: Secondary | ICD-10-CM | POA: Diagnosis not present

## 2020-09-04 ENCOUNTER — Other Ambulatory Visit: Payer: Self-pay

## 2020-09-04 ENCOUNTER — Encounter (HOSPITAL_COMMUNITY): Payer: Self-pay

## 2020-09-04 ENCOUNTER — Ambulatory Visit: Payer: Medicare Other | Admitting: Gastroenterology

## 2020-09-04 ENCOUNTER — Emergency Department (HOSPITAL_COMMUNITY): Payer: Medicare Other

## 2020-09-04 ENCOUNTER — Emergency Department (HOSPITAL_COMMUNITY)
Admission: EM | Admit: 2020-09-04 | Discharge: 2020-09-04 | Disposition: A | Discharge status: Home / Self Care | Payer: Medicare Other | Attending: Emergency Medicine | Admitting: Emergency Medicine

## 2020-09-04 ENCOUNTER — Encounter: Payer: Self-pay | Admitting: Gastroenterology

## 2020-09-04 VITALS — BP 156/88 | HR 68 | Temp 97.7°F | Ht 64.0 in | Wt 210.4 lb

## 2020-09-04 DIAGNOSIS — Z853 Personal history of malignant neoplasm of breast: Secondary | ICD-10-CM | POA: Diagnosis not present

## 2020-09-04 DIAGNOSIS — I4891 Unspecified atrial fibrillation: Secondary | ICD-10-CM | POA: Diagnosis not present

## 2020-09-04 DIAGNOSIS — I499 Cardiac arrhythmia, unspecified: Secondary | ICD-10-CM | POA: Diagnosis not present

## 2020-09-04 DIAGNOSIS — R0602 Shortness of breath: Secondary | ICD-10-CM | POA: Diagnosis not present

## 2020-09-04 DIAGNOSIS — K582 Mixed irritable bowel syndrome: Secondary | ICD-10-CM

## 2020-09-04 DIAGNOSIS — Z743 Need for continuous supervision: Secondary | ICD-10-CM | POA: Diagnosis not present

## 2020-09-04 DIAGNOSIS — R0789 Other chest pain: Secondary | ICD-10-CM | POA: Diagnosis not present

## 2020-09-04 DIAGNOSIS — Z87891 Personal history of nicotine dependence: Secondary | ICD-10-CM | POA: Insufficient documentation

## 2020-09-04 DIAGNOSIS — R002 Palpitations: Secondary | ICD-10-CM | POA: Diagnosis present

## 2020-09-04 DIAGNOSIS — I480 Paroxysmal atrial fibrillation: Secondary | ICD-10-CM | POA: Insufficient documentation

## 2020-09-04 DIAGNOSIS — R079 Chest pain, unspecified: Secondary | ICD-10-CM | POA: Diagnosis not present

## 2020-09-04 LAB — CBC WITH DIFFERENTIAL/PLATELET
Abs Immature Granulocytes: 0.03 10*3/uL (ref 0.00–0.07)
Basophils Absolute: 0 10*3/uL (ref 0.0–0.1)
Basophils Relative: 1 %
Eosinophils Absolute: 0.2 10*3/uL (ref 0.0–0.5)
Eosinophils Relative: 2 %
HCT: 44.4 % (ref 36.0–46.0)
Hemoglobin: 14.6 g/dL (ref 12.0–15.0)
Immature Granulocytes: 0 %
Lymphocytes Relative: 15 %
Lymphs Abs: 1.2 10*3/uL (ref 0.7–4.0)
MCH: 31.1 pg (ref 26.0–34.0)
MCHC: 32.9 g/dL (ref 30.0–36.0)
MCV: 94.5 fL (ref 80.0–100.0)
Monocytes Absolute: 0.8 10*3/uL (ref 0.1–1.0)
Monocytes Relative: 10 %
Neutro Abs: 5.6 10*3/uL (ref 1.7–7.7)
Neutrophils Relative %: 72 %
Platelets: 275 10*3/uL (ref 150–400)
RBC: 4.7 MIL/uL (ref 3.87–5.11)
RDW: 13.3 % (ref 11.5–15.5)
WBC: 7.8 10*3/uL (ref 4.0–10.5)
nRBC: 0 % (ref 0.0–0.2)

## 2020-09-04 LAB — PROTIME-INR
INR: 1 (ref 0.8–1.2)
Prothrombin Time: 12.7 seconds (ref 11.4–15.2)

## 2020-09-04 LAB — COMPREHENSIVE METABOLIC PANEL
ALT: 55 U/L — ABNORMAL HIGH (ref 0–44)
AST: 32 U/L (ref 15–41)
Albumin: 3.8 g/dL (ref 3.5–5.0)
Alkaline Phosphatase: 72 U/L (ref 38–126)
Anion gap: 16 — ABNORMAL HIGH (ref 5–15)
BUN: 13 mg/dL (ref 8–23)
CO2: 21 mmol/L — ABNORMAL LOW (ref 22–32)
Calcium: 9.3 mg/dL (ref 8.9–10.3)
Chloride: 99 mmol/L (ref 98–111)
Creatinine, Ser: 0.98 mg/dL (ref 0.44–1.00)
GFR, Estimated: 58 mL/min — ABNORMAL LOW (ref >60)
Glucose, Bld: 91 mg/dL (ref 70–99)
Potassium: 3.6 mmol/L (ref 3.5–5.1)
Sodium: 136 mmol/L (ref 135–145)
Total Bilirubin: 1.1 mg/dL (ref 0.3–1.2)
Total Protein: 7.1 g/dL (ref 6.5–8.1)

## 2020-09-04 LAB — TROPONIN I (HIGH SENSITIVITY)
Troponin I (High Sensitivity): 17 ng/L (ref <18)
Troponin I (High Sensitivity): 17 ng/L (ref <18)

## 2020-09-04 MED ORDER — APIXABAN 5 MG PO TABS: 5.0000 mg | ORAL_TABLET | Freq: Two times a day (BID) | ORAL | 0 refills | Status: DC

## 2020-09-04 MED ORDER — DILTIAZEM HCL 30 MG PO TABS: 30.0000 mg | ORAL_TABLET | Freq: Four times a day (QID) | ORAL | 0 refills | Status: DC | PRN

## 2020-09-04 MED ORDER — DILTIAZEM HCL-DEXTROSE 125-5 MG/125ML-% IV SOLN (PREMIX)
5.0000 mg/h | INTRAVENOUS | Status: DC
  Filled 2020-09-04: qty 125

## 2020-09-04 MED ORDER — HEPARIN BOLUS VIA INFUSION: 4000.0000 [IU] | Freq: Once | INTRAVENOUS | Status: DC

## 2020-09-04 MED ORDER — DILTIAZEM LOAD VIA INFUSION
10.0000 mg | Freq: Once | INTRAVENOUS | Status: DC
  Filled 2020-09-04: qty 10

## 2020-09-04 MED ORDER — SODIUM CHLORIDE 0.9 % IV BOLUS: 500.0000 mL | Freq: Once | INTRAVENOUS | Status: DC

## 2020-09-04 MED ORDER — HEPARIN (PORCINE) 25000 UT/250ML-% IV SOLN
1100.0000 [IU]/h | INTRAVENOUS | Status: DC
  Filled 2020-09-04: qty 250

## 2020-09-04 MED ORDER — APIXABAN 5 MG PO TABS
5.0000 mg | ORAL_TABLET | Freq: Two times a day (BID) | ORAL | Status: DC
  Administered 2020-09-04: 5 mg via ORAL
  Filled 2020-09-04: qty 1

## 2020-09-04 NOTE — Progress Notes (Signed)
Referring Provider: Celene Squibb, MD Primary Care Physician:  Celene Squibb, MD Primary GI: Dr. Abbey Chatters   Chief Complaint  Patient presents with  . ibs flare    diarrea, nausea, vomiting, lasted about 24 hours    HPI:   Kathy Evans is a 69 y.o. female presenting today with a history of IBS (alternating constipation and diarrhea) and GERD. Diagnosed with breast cancer May 2018, underwent right partial mastectomy followed by adjuvant radiation.   Acute onset N/V/D a few days ago recently lasting 24 hours. Abdominal cramping. Started a new diet that included quiona, chia seeds, cucumbers, strawberries. Was vomiting quinoa. Hadn't had episode in awhile. States every once in awhile has N/V/D usually related to food intake but rare. She can't remember the last episode. Feels better today.   Uses Senna stool softener as needed. Gets more constipated with fiber. Omeprazole daily. Sometimes stinging sensation to right of umbilicus. Noted with vomiting. Only occasionally. No dysphagia. No rectal bleeding.   Following a new diet that is an app. Tracking. Swimming. Working out at [employer].   Past Medical History:  Diagnosis Date  . Arthritis   . Breast cancer ([place]) 03/2017   Right  . Bronchitis   . Depression   . GERD (gastroesophageal reflux disease)   . Hyperlipidemia   . IBS (irritable bowel syndrome)   . Impingement syndrome of left shoulder   . Personal history of radiation therapy 2018    Past Surgical History:  Procedure Laterality Date  . ADENOIDECTOMY    . APPENDECTOMY    . BREAST BIOPSY Left    benign  . BREAST LUMPECTOMY Right 2018  . BUNIONECTOMY Right   . CARDIAC CATHETERIZATION    . CATARACT EXTRACTION, BILATERAL    . CHOLECYSTECTOMY    . COLONOSCOPY WITH PROPOFOL N/A 02/02/2018   Dr. Oneida Alar: multiple small and large-mouthed diverticula in recto-sigmoid colon, sigmoid, and descending. TI normal. Internal hemorrhoids during retroflexion, small.  Moderate external hemorrhoids  . Full mastectomy Right   . MASTECTOMY, PARTIAL Right   . PROLAPSED UTERINE FIBROID LIGATION    . TONSILLECTOMY    . TUBAL LIGATION    . VAGINAL HYSTERECTOMY    . VESICOVAGINAL FISTULA CLOSURE      Current Outpatient Medications  Medication Sig Dispense Refill  . ALPRAZolam (XANAX) 0.5 MG tablet Take 0.5 mg by mouth daily as needed for anxiety.     . Biotin 10000 MCG TABS daily.     Marland Kitchen buPROPion (WELLBUTRIN XL) 300 MG 24 hr tablet Take 300 mg by mouth at bedtime.    . Cholecalciferol (VITAMIN D3) 125 MCG (5000 UT) CAPS Take 1 capsule by mouth daily. W/ calcium    . ezetimibe (ZETIA) 10 MG tablet Take 10 mg by mouth daily.     . fluticasone (FLONASE) 50 MCG/ACT nasal spray Place 1-2 sprays into both nostrils daily as needed for allergies or rhinitis.     Marland Kitchen levocetirizine (XYZAL) 5 MG tablet Take 5 mg by mouth in the morning.     Marland Kitchen omeprazole (PRILOSEC) 40 MG capsule Take 40 mg by mouth daily before breakfast.     . tetrahydrozoline-zinc (VISINE-AC) 0.05-0.25 % ophthalmic solution Place 1-2 drops into both eyes 3 (three) times daily as needed (for irritated/itchy/allergy eyes.).     Marland Kitchen metoprolol tartrate (LOPRESSOR) 100 MG tablet Take 1 tablet (100 mg total) by mouth once for 1 dose. 2 hours before your ct scan (Patient not taking:  Reported on 09/04/2020) 1 tablet 0   No current facility-administered medications for this visit.    Allergies as of 09/04/2020  . (No Known Allergies)    Family History  Problem Relation Age of Onset  . Diabetes Maternal Grandmother   . Alzheimer's disease Maternal Grandmother   . Heart disease Father   . Cancer Mother        uterine  . Diabetes Mother   . Heart disease Brother   . Diabetes Brother   . Cancer Sister        lung  . Diabetes Sister   . Heart disease Sister        had stent placed  . Heart disease Brother   . Cancer Sister        uterine  . Diabetes Sister   . High blood pressure Sister   . Colon  cancer Neg Hx     Social History   Socioeconomic History  . Marital status: Married    Spouse name: Not on file  . Number of children: Not on file  . Years of education: Not on file  . Highest education level: Not on file  Occupational History  . Occupation: retired  Tobacco Use  . Smoking status: Former Smoker    Packs/day: 1.50    Years: 35.00    Pack years: 52.50    Types: Cigarettes    Quit date: 2000    Years since quitting: 21.8  . Smokeless tobacco: Never Used  Vaping Use  . Vaping Use: Never used  Substance and Sexual Activity  . Alcohol use: Yes    Comment: very seldom  . Drug use: No  . Sexual activity: Not on file    Comment: hyst  Other Topics Concern  . Not on file  Social History Narrative  . Not on file   Social Determinants of Health   Financial Resource Strain:   . Difficulty of Paying Living Expenses: Not on file  Food Insecurity:   . Worried About Charity fundraiser in the Last Year: Not on file  . Ran Out of Food in the Last Year: Not on file  Transportation Needs:   . Lack of Transportation (Medical): Not on file  . Lack of Transportation (Non-Medical): Not on file  Physical Activity:   . Days of Exercise per Week: Not on file  . Minutes of Exercise per Session: Not on file  Stress:   . Feeling of Stress : Not on file  Social Connections:   . Frequency of Communication with Friends and Family: Not on file  . Frequency of Social Gatherings with Friends and Family: Not on file  . Attends Religious Services: Not on file  . Active Member of Clubs or Organizations: Not on file  . Attends Archivist Meetings: Not on file  . Marital Status: Not on file    Review of Systems: Gen: Denies fever, chills, anorexia. Denies fatigue, weakness, weight loss.  CV: Denies chest pain, palpitations, syncope, peripheral edema, and claudication. Resp: Denies dyspnea at rest, cough, wheezing, coughing up blood, and pleurisy. GI: see HPI Derm:  Denies rash, itching, dry skin Psych: Denies depression, anxiety, memory loss, confusion. No homicidal or suicidal ideation.  Heme: Denies bruising, bleeding, and enlarged lymph nodes.  Physical Exam: BP (!) 156/88   Pulse 68   Temp 97.7 F (36.5 C)   Ht 5\' 4"  (1.626 m)   Wt 210 lb 6.4 oz (95.4 kg)   BMI  36.12 kg/m  General:   Alert and oriented. No distress noted. Pleasant and cooperative.  Head:  Normocephalic and atraumatic. Eyes:  Conjuctiva clear without scleral icterus. Mouth:  Mask in place Abdomen:  +BS, soft, mild TTP periumbilically and non-distended. No rebound or guarding. No HSM or masses noted. Msk:  Symmetrical without gross deformities. Normal posture. Extremities:  Without edema. Neurologic:  Alert and  oriented x4 Psych:  Alert and cooperative. Normal mood and affect.  ASSESSMENT/PLAN: Neave Lenger is a 69 y.o. female presenting today with history of IBS, constipation-predominant, with reported 24 hours of N/V/D that was self-limiting after eating new foods from her diet.  Episode likely dietary-related; she has no alarm signs/symptoms and back to her baseline. Continues with omeprazole daily for GERD, which her PCP refills. Colonoscopy on file from 2019. Next screening in 2029 if health permits.   We will see her back as needed. She is to call if any further episodes.   Annitta Needs, PhD, ANP-BC [facility]

## 2020-09-04 NOTE — Progress Notes (Signed)
ANTICOAGULATION CONSULT NOTE - Initial Consult  Pharmacy Consult for heparin Indication: atrial fibrillation  No Known Allergies  Patient Measurements: Height: 5\' 4"  (162.6 cm) Weight: 93 kg (205 lb) IBW/kg (Calculated) : 54.7 Heparin Dosing Weight: 76 kg  Vital Signs: Temp: 99.1 F (37.3 C) (10/19 1614) Temp Source: Oral (10/19 1614) BP: 148/95 (10/19 1614) Pulse Rate: 130 (10/19 1614)  Labs: No results for input(s): HGB, HCT, PLT, APTT, LABPROT, INR, HEPARINUNFRC, HEPRLOWMOCWT, CREATININE, CKTOTAL, CKMB, TROPONINIHS in the last 72 hours.  CrCl cannot be calculated (Patient's most recent lab result is older than the maximum 21 days allowed.).   Medical History: Past Medical History:  Diagnosis Date  . Arthritis   . Breast cancer ([place]) 03/2017   Right  . Bronchitis   . Depression   . GERD (gastroesophageal reflux disease)   . Hyperlipidemia   . IBS (irritable bowel syndrome)   . Impingement syndrome of left shoulder   . Personal history of radiation therapy 2018    Medications:  (Not in a hospital admission)   Assessment: Pharmacy consulted to dose heparin in patient with atrial fibrillation.  Patient is not on anticoagulation prior to admission.   Goal of Therapy:  Heparin level 0.3-0.7 units/ml Monitor platelets by anticoagulation protocol: Yes   Plan:  Give 4000 units bolus x 1 Start heparin infusion at 1100 units/hr Check anti-Xa level in 8+ hours and daily while on heparin Continue to monitor H&H and platelets  Revonda Standard Delma Drone 09/04/2020,4:26 PM

## 2020-09-04 NOTE — ED Provider Notes (Signed)
Emergency Department Provider Note   I have reviewed the triage vital signs and the nursing notes.   HISTORY  Chief Complaint Irregular Heart Beat   HPI Kathy Evans is a 69 y.o. female with past medical history reviewed below presents to the emergency department with acute onset discomfort in her jaw and heart palpitations which began around 2 PM this afternoon.  Patient has had intermittent palpitations symptoms in the past and is followed with cardiology recently but no clear diagnosis has been established at this point.  She states that this time was more severe and did not go away so she called EMS.  They arrived to find the patient in A. fib with RVR and gave multiple rounds of IV diltiazem with improvement in heart rate but no conversion to sinus rhythm.  Patient denies shortness of breath or passing out.  She had some IBS symptoms that were moderate to severe yesterday but those have resolved.  No fevers or chills.    Past Medical History:  Diagnosis Date  . Arthritis   . Breast cancer ([place]) 03/2017   Right  . Bronchitis   . Depression   . GERD (gastroesophageal reflux disease)   . Hyperlipidemia   . IBS (irritable bowel syndrome)   . Impingement syndrome of left shoulder   . Personal history of radiation therapy 2018    Patient Active Problem List   Diagnosis Date Noted  . GERD (gastroesophageal reflux disease) 02/22/2018  . Heme positive stool 11/26/2017  . Irritable bowel syndrome with both constipation and diarrhea 09/25/2017  . Left lower quadrant abdominal tenderness without rebound tenderness 09/17/2017  . History of breast cancer 09/17/2017  . Vaginal atrophy 09/17/2017  . Vaginal discharge 09/17/2017  . BV (bacterial vaginosis) 09/17/2017  . Ductal carcinoma of breast, estrogen receptor positive, stage 1 ([place]) 08/18/2017    Past Surgical History:  Procedure Laterality Date  . ADENOIDECTOMY    . APPENDECTOMY    . BREAST BIOPSY Left    benign  .  BREAST LUMPECTOMY Right 2018  . BUNIONECTOMY Right   . CARDIAC CATHETERIZATION    . CATARACT EXTRACTION, BILATERAL    . CHOLECYSTECTOMY    . COLONOSCOPY WITH PROPOFOL N/A 02/02/2018   Dr. Oneida Alar: multiple small and large-mouthed diverticula in recto-sigmoid colon, sigmoid, and descending. TI normal. Internal hemorrhoids during retroflexion, small. Moderate external hemorrhoids  . Full mastectomy Right   . MASTECTOMY, PARTIAL Right   . PROLAPSED UTERINE FIBROID LIGATION    . TONSILLECTOMY    . TUBAL LIGATION    . VAGINAL HYSTERECTOMY    . VESICOVAGINAL FISTULA CLOSURE      Allergies Tape  Family History  Problem Relation Age of Onset  . Diabetes Maternal Grandmother   . Alzheimer's disease Maternal Grandmother   . Heart disease Father   . Cancer Mother        uterine  . Diabetes Mother   . Heart disease Brother   . Diabetes Brother   . Cancer Sister        lung  . Diabetes Sister   . Heart disease Sister        had stent placed  . Heart disease Brother   . Cancer Sister        uterine  . Diabetes Sister   . High blood pressure Sister   . Colon cancer Neg Hx     Social History Social History   Tobacco Use  . Smoking status: Former Smoker  Packs/day: 1.50    Years: 35.00    Pack years: 52.50    Types: Cigarettes    Quit date: 2000    Years since quitting: 21.8  . Smokeless tobacco: Never Used  Vaping Use  . Vaping Use: Never used  Substance Use Topics  . Alcohol use: Yes    Comment: very seldom  . Drug use: No    Review of Systems  Constitutional: No fever/chills Eyes: No visual changes. ENT: No sore throat. Cardiovascular: Positive CP and jaw pain with palpitations.  Respiratory: Denies shortness of breath. Gastrointestinal: No abdominal pain.  No nausea, no vomiting.  No diarrhea.  No constipation. Genitourinary: Negative for dysuria. Musculoskeletal: Negative for back pain. Skin: Negative for rash. Neurological: Negative for headaches, focal  weakness or numbness.  10-point ROS otherwise negative.  ____________________________________________   PHYSICAL EXAM:  VITAL SIGNS: ED Triage Vitals  Enc Vitals Group     BP 09/04/20 1614 (!) 148/95     Pulse Rate 09/04/20 1614 (!) 130     Resp 09/04/20 1614 20     Temp 09/04/20 1614 99.1 F (37.3 C)     Temp Source 09/04/20 1614 Oral     SpO2 09/04/20 1614 99 %     Weight 09/04/20 1605 205 lb (93 kg)     Height 09/04/20 1605 5\' 4"  (1.626 m)   Constitutional: Alert and oriented. Well appearing and in no acute distress. Eyes: Conjunctivae are normal.  Head: Atraumatic. Nose: No congestion/rhinnorhea. Mouth/Throat: Mucous membranes are moist.  Neck: No stridor.  Cardiovascular: irregularly irregular. Good peripheral circulation. Grossly normal heart sounds.   Respiratory: Normal respiratory effort.  No retractions. Lungs CTAB. Gastrointestinal: Soft with mild diffuse tenderness. No distention.  Musculoskeletal: No lower extremity tenderness nor edema.  Neurologic:  Normal speech and language.  Skin:  Skin is warm, dry and intact. No rash noted.  ____________________________________________   LABS (all labs ordered are listed, but only abnormal results are displayed)  Labs Reviewed  COMPREHENSIVE METABOLIC PANEL - Abnormal; Notable for the following components:      Result Value   CO2 21 (*)    ALT 55 (*)    GFR, Estimated 58 (*)    Anion gap 16 (*)    All other components within normal limits  RESPIRATORY PANEL BY RT PCR (FLU A&B, COVID)  CBC WITH DIFFERENTIAL/PLATELET  PROTIME-INR  TROPONIN I (HIGH SENSITIVITY)  TROPONIN I (HIGH SENSITIVITY)   ____________________________________________  EKG  EKG 1:   EKG Interpretation  Date/Time:  Tuesday September 04 2020 16:18:26 EDT Ventricular Rate:  136 PR Interval:    QRS Duration: 84 QT Interval:  333 QTC Calculation: 501 R Axis:   -65 Text Interpretation: Atrial fibrillation Ventricular premature complex  Left anterior fascicular block Abnormal R-wave progression, late transition Nonspecific repol abnormality, diffuse leads Prolonged QT interval Baseline wander in lead(s) II III aVR aVF Confirmed by Nanda Quinton [phone]) on 09/04/2020 5:13:53 PM      EKG 2:    EKG Interpretation  Date/Time:  Tuesday September 04 2020 16:42:56 EDT Ventricular Rate:  74 PR Interval:    QRS Duration: 97 QT Interval:  391 QTC Calculation: 434 R Axis:   -43 Text Interpretation: Sinus rhythm Atrial premature complexes Left axis deviation Borderline T abnormalities, anterior leads No STEMI Confirmed by Nanda Quinton [id number]) on 09/04/2020 5:14:27 PM       ____________________________________________  RADIOLOGY  CXR reviewed. Favor atelectasis. No PNA symptoms.   ____________________________________________   PROCEDURES  Procedure(s) performed:   Procedures  CRITICAL CARE Performed by: Margette Fast Total critical care time: 35 minutes Critical care time was exclusive of separately billable procedures and treating other patients. Critical care was necessary to treat or prevent imminent or life-threatening deterioration. Critical care was time spent personally by me on the following activities: development of treatment plan with patient and/or surrogate as well as nursing, discussions with consultants, evaluation of patient's response to treatment, examination of patient, obtaining history from patient or surrogate, ordering and performing treatments and interventions, ordering and review of laboratory studies, ordering and review of radiographic studies, pulse oximetry and re-evaluation of patient's condition.  Nanda Quinton, MD Emergency Medicine  ____________________________________________   INITIAL IMPRESSION / ASSESSMENT AND PLAN / ED COURSE  Pertinent labs & imaging results that were available during my care of the patient were reviewed by me and considered in my medical decision making (see  chart for details).   Patient arrives to the emergency department with A. fib with RVR.  She has had palpitation symptoms in the past and because of this is not a ED cardioversion candidate.  She has no contraindication to anticoagulation.  CHA2DS2-VASc score is 2.   Shortly after my evaluation and prior to additional diltiazem the patient spontaneously converted to normal sinus rhythm which was captured on EKG # 2. Plan for trending troponin. If she is able to maintain rhythm she would be a good candidate for anticoagulation and discharge home with close Cardiology follow up which she already has established.   Troponin with no significant change. Discussed risk/benefit on anticoagulation and started the patient on Eliquis. 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 Discussed ED return precautions.  ____________________________________________  FINAL CLINICAL IMPRESSION(S) / ED DIAGNOSES  Final diagnoses:  Atrial fibrillation with RVR ([place])     NEW OUTPATIENT MEDICATIONS STARTED DURING THIS VISIT:  Discharge Medication List as of 09/04/2020  7:51 PM    START taking these medications   Details  apixaban (ELIQUIS) 5 MG TABS tablet Take 1 tablet (5 mg total) by mouth 2 (two) times daily., Starting Tue 09/04/2020, Until Thu 10/04/2020, Print    diltiazem (CARDIZEM) 30 MG tablet Take 1 tablet (30 mg total) by mouth 4 (four) times daily as needed (palpitations or racing heartbeat)., Starting Tue 09/04/2020, Print        Note:  This document was prepared using Dragon voice recognition software and may include unintentional dictation errors.  Nanda Quinton, MD, [facility] Emergency Medicine    Malori Myers, W[facility], MD 09/06/20 [phone]

## 2020-09-04 NOTE — Discharge Instructions (Signed)
You were seen in the emergency room today and found to be in atrial fibrillation. You came out of this rhythm on your own after receiving medicines with EMS. We did not have to give additional medications. We did start you on a blood thinner called Eliquis. This makes it more likely for you to have bleeding and you should stop taking this medicine and present to the emergency department if you have any hits to the head, are involved on a car accident, start to see blood or black in your bowel movements. You cannot take aspirin, ibuprofen, naproxen, or other NSAID medications while on Eliquis. Tylenol is okay to take for mild to moderate pain.   I have also called in a prescription for diltiazem. You take this medication only if you develop symptoms. Please call your cardiologist first thing tomorrow morning to schedule a follow-up appointment in the next 1 to 2 days.

## 2020-09-04 NOTE — Patient Instructions (Signed)
Continue your nutrition plan as you are doing, but avoid those triggers like we discussed (the seeds, skins, etc).   Please call if any further flares or concerns!  We will see you back as needed!  I enjoyed seeing you again today! As you know, I value our relationship and want to provide genuine, compassionate, and quality care. I welcome your feedback. If you receive a survey regarding your visit,  I greatly appreciate you taking time to fill this out. See you next time!  Annitta Needs, PhD, ANP-BC [facility]

## 2020-09-04 NOTE — ED Triage Notes (Addendum)
EMS reports pt has family history of afib.  Today around 2pm pt started feeling light headed and tightness in her jaws.   Reports also had some pain in left arm.  Reports HR was 114.  EMS says pt was afib and hr fluctuating as high as 190.  EMS gave 23mg  cardizem IV, HR still 120-140's and ems gave another 5mg  cardizem PTA.  Pt says still feels light headed but not having and chest pressure but no jaw pain or left arm pain.

## 2020-09-04 NOTE — Progress Notes (Signed)
ANTICOAGULATION CONSULT NOTE - Initial Consult  Pharmacy Consult for apixaban Indication: atrial fibrillation  No Known Allergies  Patient Measurements: Height: 5\' 4"  (162.6 cm) Weight: 93 kg (205 lb) IBW/kg (Calculated) : 54.7  Vital Signs: Temp: 99.1 F (37.3 C) (10/19 1614) Temp Source: Oral (10/19 1614) BP: 113/60 (10/19 1800) Pulse Rate: 66 (10/19 1800)  Labs: Recent Labs    09/04/20 1637  HGB 14.6  HCT 44.4  PLT 275  LABPROT 12.7  INR 1.0  CREATININE 0.98  TROPONINIHS 17    Estimated Creatinine Clearance: 58.2 mL/min (by C-G formula based on SCr of 0.98 mg/dL).   Medical History: Past Medical History:  Diagnosis Date  . Arthritis   . Breast cancer ([place]) 03/2017   Right  . Bronchitis   . Depression   . GERD (gastroesophageal reflux disease)   . Hyperlipidemia   . IBS (irritable bowel syndrome)   . Impingement syndrome of left shoulder   . Personal history of radiation therapy 2018    Medications:  (Not in a hospital admission)   Assessment: 69 yo F presented to the ED with new onset afib.  Pharmacy initially consulted to dose heparin however pt now back in NSR.  Plans to start apixaban instead.    Goal of Therapy:  Therapeutic Anticoagulation Monitor platelets by anticoagulation protocol: Yes   Plan:  Apixaban 5mg  PO BID   [employer], Pharm.D., BCPS Clinical Pharmacist  **Pharmacist phone directory can be found on [email or web address] listed under [place].  09/04/2020 6:39 PM

## 2020-09-04 NOTE — ED Notes (Signed)
RN went into pt's room to give medications and pt's HR was 77bpm on monitor. EKG done and pt's EKG showing NSR. EKG given to Dr. Laverta Baltimore who gave verbal order to hold off on giving NS bolus, Cardizem and Heparin.

## 2020-09-04 NOTE — ED Notes (Addendum)
Pt got up to ambulate to the restroom and back with no difficulties. Pt ambulated with steady gait. Pt denied any chest pressure, dizziness, heart fluttering during this time. When pt returned back to bed, HR 81bpm.

## 2020-09-07 ENCOUNTER — Ambulatory Visit: Payer: Medicare Other | Admitting: Physician Assistant

## 2020-09-07 ENCOUNTER — Encounter: Payer: Self-pay | Admitting: Physician Assistant

## 2020-09-07 ENCOUNTER — Other Ambulatory Visit: Payer: Self-pay

## 2020-09-07 ENCOUNTER — Telehealth: Payer: Self-pay | Admitting: Physician Assistant

## 2020-09-07 VITALS — BP 120/70 | HR 75 | Resp 16 | Ht 64.0 in | Wt 207.6 lb

## 2020-09-07 DIAGNOSIS — I480 Paroxysmal atrial fibrillation: Secondary | ICD-10-CM

## 2020-09-07 DIAGNOSIS — I248 Other forms of acute ischemic heart disease: Secondary | ICD-10-CM | POA: Diagnosis not present

## 2020-09-07 DIAGNOSIS — Z7901 Long term (current) use of anticoagulants: Secondary | ICD-10-CM | POA: Diagnosis not present

## 2020-09-07 DIAGNOSIS — I2489 Other forms of acute ischemic heart disease: Secondary | ICD-10-CM

## 2020-09-07 MED ORDER — DILTIAZEM HCL ER COATED BEADS 120 MG PO CP24: 120.0000 mg | ORAL_CAPSULE | Freq: Every day | ORAL | 11 refills | Status: DC

## 2020-09-07 MED ORDER — DILTIAZEM HCL ER COATED BEADS 120 MG PO CP24: 120.0000 mg | ORAL_CAPSULE | Freq: Every day | ORAL | 3 refills | Status: DC

## 2020-09-07 NOTE — Telephone Encounter (Signed)
Pre-cert Verification for the following procedure    ECHO   DATE: 09/13/2020  LOCATION: [facility]

## 2020-09-07 NOTE — Patient Instructions (Signed)
Medication Instructions:  Your physician has recommended you make the following change in your medication:   Start Cardizem 120 mg Daily   May take Cardizem for heart rate above 120.   *If you need a refill on your cardiac medications before your next appointment, please call your pharmacy*   Lab Work: Your physician recommends that you return for lab work in: Today   If you have labs (blood work) drawn today and your tests are completely normal, you will receive your results only by:  MyChart Message (if you have MyChart) OR  A paper copy in the mail If you have any lab test that is abnormal or we need to change your treatment, we will call you to review the results.   Testing/Procedures: NONE    Follow-Up: At [facility], you and your health needs are our priority.  As part of our continuing mission to provide you with exceptional heart care, we have created designated Provider Care Teams.  These Care Teams include your primary Cardiologist (physician) and Advanced Practice Providers (APPs -  Physician Assistants and Nurse Practitioners) who all work together to provide you with the care you need, when you need it.  We recommend signing up for the patient portal called "MyChart".  Sign up information is provided on this After Visit Summary.  MyChart is used to connect with patients for Virtual Visits (Telemedicine).  Patients are able to view lab/test results, encounter notes, upcoming appointments, etc.  Non-urgent messages can be sent to your provider as well.   To learn more about what you can do with MyChart, go to [email or web address].    Your next appointment:   3 month(s)  The format for your next appointment:   In Person  Provider:   Rozann Lesches, MD   Other Instructions Thank you for choosing [facility]!

## 2020-09-07 NOTE — Progress Notes (Signed)
Cardiology Office Note   Date:  09/07/2020   ID:  Kathy Evans, [name] [birth year], MRN [id number]  PCP:  Celene Squibb, MD Cardiologist:  Rozann Lesches, MD 07/11/2020 Electrphysiologist: None Rosaria Ferries, PA-C   No chief complaint on file.   History of Present Illness: Kathy Evans is a 69 y.o. female with a history of exertional fatigue and SOB (CTA planned but not done), FH CAD, HLD, IBS, GERD, R breast CA, OA, depression, palpitations  Kathy Evans presents for hospital f/u after ER visit 10/19 where she was diagnosed w/ Afib. Spont conversion to SR, d/c on Eliquis and Cardizem  She feels pretty well, has been monitoring her heart rate.   HR runs in the 60s while resting at home, SBP 120s-130s.   The night before the Afib, she had an IBS flare, w/ vomiting and diarrhea.   Other than that, no unusual events or problems. She has not taken OTC meds, no excessive caffeine.   Had some CP, pressure and tightness w/ the rapid Afib, is concerned about this.  Since she did not have clear palpitations w/ the Afib, is not sure when to take the short-acting Dilt.    Past Medical History:  Diagnosis Date  . Arthritis   . Breast cancer ([place]) 03/2017   Right  . Bronchitis   . Depression   . GERD (gastroesophageal reflux disease)   . Hyperlipidemia   . IBS (irritable bowel syndrome)   . Impingement syndrome of left shoulder   . Personal history of radiation therapy 2018    Past Surgical History:  Procedure Laterality Date  . ADENOIDECTOMY    . APPENDECTOMY    . BREAST BIOPSY Left    benign  . BREAST LUMPECTOMY Right 2018  . BUNIONECTOMY Right   . CARDIAC CATHETERIZATION    . CATARACT EXTRACTION, BILATERAL    . CHOLECYSTECTOMY    . COLONOSCOPY WITH PROPOFOL N/A 02/02/2018   Dr. Oneida Alar: multiple small and large-mouthed diverticula in recto-sigmoid colon, sigmoid, and descending. TI normal. Internal hemorrhoids during retroflexion, small. Moderate external  hemorrhoids  . Full mastectomy Right   . MASTECTOMY, PARTIAL Right   . PROLAPSED UTERINE FIBROID LIGATION    . TONSILLECTOMY    . TUBAL LIGATION    . VAGINAL HYSTERECTOMY    . VESICOVAGINAL FISTULA CLOSURE      Current Outpatient Medications  Medication Sig Dispense Refill  . ALPRAZolam (XANAX) 0.5 MG tablet Take 0.5 mg by mouth daily as needed for anxiety.     Marland Kitchen apixaban (ELIQUIS) 5 MG TABS tablet Take 1 tablet (5 mg total) by mouth 2 (two) times daily. 60 tablet 0  . Biotin 10000 MCG TABS daily.     Marland Kitchen buPROPion (WELLBUTRIN XL) 300 MG 24 hr tablet Take 300 mg by mouth at bedtime.    . Calcium Carb-Cholecalciferol (CALCIUM 600/VITAMIN D3 PO) Take 1 capsule by mouth daily.    . Cholecalciferol (VITAMIN D3) 125 MCG (5000 UT) CAPS Take 1 capsule by mouth daily. W/ calcium    . diltiazem (CARDIZEM) 30 MG tablet Take 1 tablet (30 mg total) by mouth 4 (four) times daily as needed (palpitations or racing heartbeat). 30 tablet 0  . ezetimibe (ZETIA) 10 MG tablet Take 10 mg by mouth daily.     . fluticasone (FLONASE) 50 MCG/ACT nasal spray Place 1-2 sprays into both nostrils daily as needed for allergies or rhinitis.     Marland Kitchen levocetirizine (XYZAL) 5 MG tablet Take 5 mg by  mouth in the morning.     Marland Kitchen omeprazole (PRILOSEC) 40 MG capsule Take 40 mg by mouth daily before breakfast.     . tetrahydrozoline-zinc (VISINE-AC) 0.05-0.25 % ophthalmic solution Place 1-2 drops into both eyes 3 (three) times daily as needed (for irritated/itchy/allergy eyes.).      No current facility-administered medications for this visit.    Allergies:   Tape    Social History:  The patient  reports that she quit smoking about 21 years ago. Her smoking use included cigarettes. She has a 52.50 pack-year smoking history. She has never used smokeless tobacco. She reports current alcohol use. She reports that she does not use drugs.   Family History:  The patient's family history includes Alzheimer's disease in her maternal  grandmother; Cancer in her mother, sister, and sister; Diabetes in her brother, maternal grandmother, mother, sister, and sister; Heart disease in her brother, brother, father, and sister; High blood pressure in her sister.  She indicated that her mother is deceased. She indicated that her father is deceased. She indicated that all of her four sisters are alive. She indicated that both of her brothers are alive. She indicated that her maternal grandmother is deceased. She indicated that her maternal grandfather is deceased. She indicated that her paternal grandmother is deceased. She indicated that her paternal grandfather is deceased. She indicated that her daughter is alive. She indicated that her son is alive. She indicated that the status of her neg hx is unknown.    ROS:  Please see the history of present illness. All other systems are reviewed and negative.    PHYSICAL EXAM: VS:  BP 120/70   Pulse 75   Resp 16   Ht 5\' 4"  (1.626 m)   Wt 207 lb 9.6 oz (94.2 kg)   SpO2 95%   BMI 35.63 kg/m  , BMI Body mass index is 35.63 kg/m. GEN: Well nourished, well developed, female in no acute distress HEENT: normal for age  Neck: no JVD, no carotid bruit, no masses Cardiac: RRR; no murmur, no rubs, or gallops Respiratory:  clear to auscultation bilaterally, normal work of breathing GI: soft, nontender, nondistended, + BS MS: no deformity or atrophy; no edema; distal pulses are 2+ in all 4 extremities  Skin: warm and dry, no rash Neuro:  Strength and sensation are intact Psych: euthymic mood, full affect   EKG:  EKG is not ordered today.   ECHO: 01/06/2020 1. Left ventricular ejection fraction, by estimation, is 55 to 60%. The  left ventricle has normal function. The left ventricle has no regional  wall motion abnormalities. There is mild left ventricular hypertrophy.  Left ventricular diastolic parameters  are consistent with Grade I diastolic dysfunction (impaired relaxation).  2.  Right ventricular systolic function is normal. The right ventricular  size is normal. There is normal pulmonary artery systolic pressure. The  estimated right ventricular systolic pressure is 40.9 mmHg.  3. The mitral valve is grossly normal. Trivial mitral valve  regurgitation.  4. The aortic valve is tricuspid. Aortic valve regurgitation is mild.  5. Aortic dilatation noted. There is mild dilatation of the ascending  aorta.  6. The inferior vena cava is normal in size with greater than 50%  respiratory variability, suggesting right atrial pressure of 3 mmHg.  7. Both atrial normal in size  CARDIAC CTA: 07/26/2020 FINDINGS: A retrospective scan was triggered in the descending thoracic aorta. Axial non-contrast 3 mm slices were carried out through the heart. The data  set was analyzed on a dedicated work station and scored using the [employer]. Gantry rotation speed was 330 msecs and collimation was .6 mm. 100mg  of metoprolol and 0.8 mg of sl NTG was given. The 3D data set was reconstructed in 5% intervals of the 50-95 % of the R-R cycle. Diastolic phases were analyzed on a dedicated work station using MPR, MIP and VRT modes. The patient received 90 cc of contrast.  Aorta:  Normal size.  No calcifications.  No dissection.  Aortic Valve:  Trileaflet.  No calcifications.  Coronary Arteries:  Normal coronary origin.  Left dominance.  RCA is a small nondominant artery.  There is no plaque.  Left main is a large artery that gives rise to LAD, Ramus and LCX arteries. There is no plaque in the LM or Ramus  LAD is a large vessel that has no plaque.  LCX is a dominant artery that gives rise to one obtuse marginal branch before giving rise to the PDA and PLA branches. There is no plaque.  Other findings:  Normal pulmonary vein drainage into the left atrium.  Normal left atrial appendage without a thrombus.  Normal size of the pulmonary  artery.  IMPRESSION: 1. Coronary calcium score of 0. Patient is low risk for near term coronary events  2. Normal coronary origin with left dominance.  3. No evidence of CAD.  4. CAD-RADS 0. Consider non-atherosclerotic causes of chest pain.   Recent Labs: 09/04/2020: ALT 55; BUN 13; Creatinine, Ser 0.98; Hemoglobin 14.6; Platelets 275; Potassium 3.6; Sodium 136  CBC    Component Value Date/Time   WBC 7.8 09/04/2020 1637   RBC 4.70 09/04/2020 1637   HGB 14.6 09/04/2020 1637   HGB 14.3 01/26/2020 0823   HCT 44.4 09/04/2020 1637   HCT 43.3 01/26/2020 0823   PLT 275 09/04/2020 1637   PLT 244 01/26/2020 0823   MCV 94.5 09/04/2020 1637   MCV 94 01/26/2020 0823   MCH 31.1 09/04/2020 1637   MCHC 32.9 09/04/2020 1637   RDW 13.3 09/04/2020 1637   RDW 12.8 01/26/2020 0823   LYMPHSABS 1.2 09/04/2020 1637   LYMPHSABS 1.2 01/26/2020 0823   MONOABS 0.8 09/04/2020 1637   EOSABS 0.2 09/04/2020 1637   EOSABS 0.2 01/26/2020 0823   BASOSABS 0.0 09/04/2020 1637   BASOSABS 0.1 01/26/2020 0823   CMP Latest Ref Rng & Units 09/04/2020 07/24/2020 01/26/2020  Glucose 70 - 99 mg/dL 91 83 94  BUN 8 - 23 mg/dL 13 11 15   Creatinine 0.44 - 1.00 mg/dL 0.98 0.87 0.84  Sodium 135 - 145 mmol/L 136 141 144  Potassium 3.5 - 5.1 mmol/L 3.6 4.4 4.6  Chloride 98 - 111 mmol/L 99 104 105  CO2 22 - 32 mmol/L 21(L) 28 22  Calcium 8.9 - 10.3 mg/dL 9.3 9.4 9.4  Total Protein 6.5 - 8.1 g/dL 7.1 - -  Total Bilirubin 0.3 - 1.2 mg/dL 1.1 - -  Alkaline Phos 38 - 126 U/L 72 - -  AST 15 - 41 U/L 32 - -  ALT 0 - 44 U/L 55(H) - -     Lipid Panel No results found for: CHOL, HDL, LDLCALC, LDLDIRECT, TRIG, CHOLHDL    Wt Readings from Last 3 Encounters:  09/07/20 207 lb 9.6 oz (94.2 kg)  09/04/20 205 lb (93 kg)  09/04/20 210 lb 6.4 oz (95.4 kg)     Other studies Reviewed: Additional studies/ records that were reviewed today include: Office notes, hospital records and testing.  ASSESSMENT AND PLAN:  1.   Paroxysmal atrial fibrillation: -She spontaneously converted to sinus rhythm after multiple doses of diltiazem -She had an episode the other day where her heart rate got up to 90, she felt it was inappropriately high and took a diltiazem. -She is concerned because she feels that she is likely to go back to the atrial fibrillation and not figure it out -We discussed options, I will add diltiazem CD 120 mg daily to her medication regimen. -She understands to watch for symptoms of hypotension and I advised that if her heart rate drops into the 50s that is okay as long as she is asymptomatic. -She will get a 30-day supply at the local drugstore for the Cardizem CD, if she tolerates it well, change to a 90-day supply through [facility] -Check a TSH and check an echocardiogram for atrial sizes to see if she is having more atrial fib than she knows -At this time, she does not wish to wear a monitor.  She feels the diagnosis is made. -If she has additional palpitations or we need to consider treatment change because she thinks she is having more atrial fibrillation, get one then  2.  Chronic anticoagulation: -She is compliant with the Eliquis, is not having any problems with this.   -She was not given a 30-day Eliquis card, we will try to get 1 for her -She asked for cheaper option, but is not interested in Coumadin. -Continue Eliquis.  3.  Chest pain, felt most likely to be demand ischemia: -I reviewed her cardiac CT results with her and explained that she had no substrate for chest pain that comes from a blockage. -I explained the concept of demand ischemia and suggested that her heart rate was so high, that her heart muscle function could not keep up with the demand -No further work-up is indicated at this time   Current medicines are reviewed at length with the patient today.  The patient has concerns regarding medicines.  Concerns were addressed  The following changes have been made: Add Cardizem  CD 120 mg daily, changed to a 90-day prescription with [facility] if she tolerates it.  Okay to continue taking as needed Cardizem if her heart rate is greater than 120  Labs/ tests ordered today include: TSH  Orders Placed This Encounter  Procedures  . TSH  . ECHOCARDIOGRAM COMPLETE     Disposition:   FU with Rozann Lesches, MD  Signed, Rosaria Ferries, PA-C  09/07/2020 4:56 PM    [place] Phone: [phone]; Fax: [phone]

## 2020-09-10 DIAGNOSIS — Z87898 Personal history of other specified conditions: Secondary | ICD-10-CM | POA: Diagnosis not present

## 2020-09-10 DIAGNOSIS — R42 Dizziness and giddiness: Secondary | ICD-10-CM | POA: Diagnosis not present

## 2020-09-10 DIAGNOSIS — R001 Bradycardia, unspecified: Secondary | ICD-10-CM | POA: Diagnosis not present

## 2020-09-10 DIAGNOSIS — R55 Syncope and collapse: Secondary | ICD-10-CM | POA: Diagnosis not present

## 2020-09-10 DIAGNOSIS — Z5181 Encounter for therapeutic drug level monitoring: Secondary | ICD-10-CM | POA: Diagnosis not present

## 2020-09-11 LAB — TSH: TSH: 1.26 mIU/L (ref 0.40–4.50)

## 2020-09-13 ENCOUNTER — Other Ambulatory Visit: Payer: Self-pay

## 2020-09-13 ENCOUNTER — Ambulatory Visit (HOSPITAL_COMMUNITY)
Admission: RE | Admit: 2020-09-13 | Discharge: 2020-09-13 | Disposition: A | Discharge status: Home / Self Care | Payer: Medicare Other | Source: Ambulatory Visit | Attending: Physician Assistant | Admitting: Physician Assistant

## 2020-09-13 DIAGNOSIS — I480 Paroxysmal atrial fibrillation: Secondary | ICD-10-CM | POA: Insufficient documentation

## 2020-09-13 LAB — ECHOCARDIOGRAM COMPLETE
Area-P 1/2: 4.08 cm2
P 1/2 time: 560 msec
S' Lateral: 2.74 cm

## 2020-09-13 NOTE — Progress Notes (Signed)
*  PRELIMINARY RESULTS* Echocardiogram 2D Echocardiogram has been performed.  Kathy Evans 09/13/2020, 10:20 AM

## 2020-09-17 ENCOUNTER — Telehealth: Payer: Self-pay | Admitting: Physician Assistant

## 2020-09-17 NOTE — Telephone Encounter (Signed)
Sent to [facility] to result

## 2020-09-17 NOTE — Telephone Encounter (Signed)
New message    Patient calling for echo results from last Thursday

## 2020-09-18 NOTE — Telephone Encounter (Signed)
Results given to patient

## 2020-10-01 ENCOUNTER — Telehealth: Payer: Self-pay

## 2020-10-01 ENCOUNTER — Other Ambulatory Visit: Payer: Self-pay | Admitting: Cardiology

## 2020-10-01 MED ORDER — APIXABAN 5 MG PO TABS: 5.0000 mg | ORAL_TABLET | Freq: Two times a day (BID) | ORAL | 3 refills | Status: DC

## 2020-10-01 MED ORDER — APIXABAN 5 MG PO TABS: 5.0000 mg | ORAL_TABLET | Freq: Two times a day (BID) | ORAL | 0 refills | Status: DC

## 2020-10-01 NOTE — Telephone Encounter (Signed)
[facility] states they dispensed 2.5 mg instead of 5 mg.They are going to call patient to resolve.

## 2020-10-01 NOTE — Telephone Encounter (Signed)
New message      *STAT* If patient is at the pharmacy, call can be transferred to refill team.   1. Which medications need to be refilled? (please list name of each medication and dose if known) eliquis   2. Which pharmacy/location (including street and city if local pharmacy) is medication to be sent to?  1 30 day supply to [facility]  Then she needs a 90 day [facility]    3. Do they need a 30 day or 90 day supply? 1 time script to [place] then [facility]

## 2020-10-01 NOTE — Telephone Encounter (Signed)
Refills completed

## 2020-10-01 NOTE — Telephone Encounter (Signed)
New message    Prescription was called in wrong she is on 5 mg of eliquis not 2.5

## 2020-10-16 DIAGNOSIS — K219 Gastro-esophageal reflux disease without esophagitis: Secondary | ICD-10-CM | POA: Diagnosis not present

## 2020-10-16 DIAGNOSIS — M1711 Unilateral primary osteoarthritis, right knee: Secondary | ICD-10-CM | POA: Diagnosis not present

## 2020-10-16 DIAGNOSIS — I10 Essential (primary) hypertension: Secondary | ICD-10-CM | POA: Diagnosis not present

## 2020-10-16 DIAGNOSIS — E7849 Other hyperlipidemia: Secondary | ICD-10-CM | POA: Diagnosis not present

## 2020-10-23 ENCOUNTER — Ambulatory Visit (HOSPITAL_COMMUNITY)
Admission: RE | Admit: 2020-10-23 | Discharge: 2020-10-23 | Disposition: A | Discharge status: Home / Self Care | Payer: Medicare Other | Source: Ambulatory Visit | Attending: Hematology | Admitting: Hematology

## 2020-10-23 ENCOUNTER — Other Ambulatory Visit: Payer: Self-pay

## 2020-10-23 DIAGNOSIS — Z853 Personal history of malignant neoplasm of breast: Secondary | ICD-10-CM | POA: Diagnosis not present

## 2020-10-23 DIAGNOSIS — Z170 Estrogen receptor positive status [ER+]: Secondary | ICD-10-CM

## 2020-10-23 DIAGNOSIS — C50911 Malignant neoplasm of unspecified site of right female breast: Secondary | ICD-10-CM

## 2020-10-23 DIAGNOSIS — R928 Other abnormal and inconclusive findings on diagnostic imaging of breast: Secondary | ICD-10-CM | POA: Diagnosis not present

## 2020-10-25 ENCOUNTER — Ambulatory Visit: Payer: Medicare Other | Attending: Internal Medicine

## 2020-10-25 DIAGNOSIS — Z23 Encounter for immunization: Secondary | ICD-10-CM

## 2020-10-25 NOTE — Progress Notes (Signed)
   Covid-19 Vaccination Clinic  Name:  Bettyjean Stefanski    MRN: [id number] DOB: [birth year]  10/25/2020  Ms. Muckle was observed post Covid-19 immunization for 15 minutes without incident. She was provided with Vaccine Information Sheet and instruction to access the V-Safe system.   Ms. Estock was instructed to call 911 with any severe reactions post vaccine: Marland Kitchen Difficulty breathing  . Swelling of face and throat  . A fast heartbeat  . A bad rash all over body  . Dizziness and weakness   Immunizations Administered    Name Date Dose VIS Date Route   Pfizer COVID-19 Vaccine 10/25/2020  1:05 PM 0.3 mL 09/05/2020 Intramuscular   Manufacturer: [employer]   Lot: [id number]   NDC: [address]-9

## 2020-10-29 ENCOUNTER — Telehealth: Payer: Self-pay | Admitting: Cardiology

## 2020-10-29 NOTE — Telephone Encounter (Signed)
*  STAT* If patient is at the pharmacy, call can be transferred to refill team.   1. Which medications need to be refilled? (please list name of each medication and dose if known) CARDIZEM CD  2. Which pharmacy/location (including street and city if local pharmacy) is medication to be sent to? [facility]   3. Do they need a 30 day or 90 day supply? 90  Patient is wanting to switch over to [facility]

## 2020-10-30 ENCOUNTER — Ambulatory Visit (HOSPITAL_COMMUNITY): Payer: Medicare Other | Admitting: Oncology

## 2020-11-01 DIAGNOSIS — Z7689 Persons encountering health services in other specified circumstances: Secondary | ICD-10-CM | POA: Diagnosis not present

## 2020-11-01 DIAGNOSIS — G720 Drug-induced myopathy: Secondary | ICD-10-CM | POA: Diagnosis not present

## 2020-11-01 DIAGNOSIS — Z0000 Encounter for general adult medical examination without abnormal findings: Secondary | ICD-10-CM | POA: Diagnosis not present

## 2020-11-01 DIAGNOSIS — R21 Rash and other nonspecific skin eruption: Secondary | ICD-10-CM | POA: Diagnosis not present

## 2020-11-01 DIAGNOSIS — K589 Irritable bowel syndrome without diarrhea: Secondary | ICD-10-CM | POA: Diagnosis not present

## 2020-11-01 DIAGNOSIS — Z79899 Other long term (current) drug therapy: Secondary | ICD-10-CM | POA: Diagnosis not present

## 2020-11-02 ENCOUNTER — Other Ambulatory Visit: Payer: Self-pay

## 2020-11-02 MED ORDER — DILTIAZEM HCL ER COATED BEADS 120 MG PO CP24: 120.0000 mg | ORAL_CAPSULE | Freq: Every day | ORAL | 3 refills | Status: DC

## 2020-11-02 NOTE — Telephone Encounter (Signed)
Refilled patients Cardizem CD and sent to [place].

## 2020-11-02 NOTE — Telephone Encounter (Signed)
New messge    2nd call from patient , prescription was not called in

## 2020-11-05 DIAGNOSIS — Z23 Encounter for immunization: Secondary | ICD-10-CM | POA: Diagnosis not present

## 2020-11-05 DIAGNOSIS — K219 Gastro-esophageal reflux disease without esophagitis: Secondary | ICD-10-CM | POA: Diagnosis not present

## 2020-11-05 DIAGNOSIS — M8580 Other specified disorders of bone density and structure, unspecified site: Secondary | ICD-10-CM | POA: Diagnosis not present

## 2020-11-05 DIAGNOSIS — E782 Mixed hyperlipidemia: Secondary | ICD-10-CM | POA: Diagnosis not present

## 2020-11-15 ENCOUNTER — Ambulatory Visit (HOSPITAL_COMMUNITY): Payer: Medicare Other | Admitting: Oncology

## 2020-12-11 ENCOUNTER — Encounter (HOSPITAL_COMMUNITY): Payer: Self-pay | Admitting: Oncology

## 2020-12-11 ENCOUNTER — Other Ambulatory Visit: Payer: Self-pay

## 2020-12-11 ENCOUNTER — Inpatient Hospital Stay (HOSPITAL_COMMUNITY): Payer: Medicare Other | Attending: Hematology | Admitting: Oncology

## 2020-12-11 VITALS — BP 138/59 | HR 60 | Temp 97.1°F | Resp 18 | Wt 201.0 lb

## 2020-12-11 DIAGNOSIS — M8580 Other specified disorders of bone density and structure, unspecified site: Secondary | ICD-10-CM | POA: Insufficient documentation

## 2020-12-11 DIAGNOSIS — Z923 Personal history of irradiation: Secondary | ICD-10-CM | POA: Insufficient documentation

## 2020-12-11 DIAGNOSIS — Z79899 Other long term (current) drug therapy: Secondary | ICD-10-CM | POA: Insufficient documentation

## 2020-12-11 DIAGNOSIS — Z7901 Long term (current) use of anticoagulants: Secondary | ICD-10-CM | POA: Insufficient documentation

## 2020-12-11 DIAGNOSIS — Z853 Personal history of malignant neoplasm of breast: Secondary | ICD-10-CM | POA: Diagnosis not present

## 2020-12-11 DIAGNOSIS — Z13820 Encounter for screening for osteoporosis: Secondary | ICD-10-CM

## 2020-12-11 DIAGNOSIS — I4891 Unspecified atrial fibrillation: Secondary | ICD-10-CM | POA: Insufficient documentation

## 2020-12-11 NOTE — Progress Notes (Signed)
[facility] [address],  [address]   Patient Care Team: Celene Squibb, MD as PCP - General (Internal Medicine) Satira Sark, MD as PCP - Cardiology (Cardiology) Danie Binder, MD (Inactive) as Consulting Physician (Gastroenterology)  SUMMARY OF ONCOLOGIC HISTORY: Oncology History   No history exists.    CHIEF COMPLIANT: Right breast cancer follow-up.   INTERVAL HISTORY: [name] is a 70 y.o. female here today for follow up of her breast cancer. Her last visit was on 04/30/20.  In the interim, she was seen by cardiology for  shortness of breath.  Had echocardiogram on 01/06/2020 that showed EF of 55 to 60%.  Had cardiac CTA without evidence of CAD.  Had follow-up with GI on 09/04/2020 for IBS flare.  She was evaluated at [facility] on 09/04/2020 for atrial fibrillation with RVR.  Spontaneously converted to normal sinus rhythm.  She was started on Eliquis.  Had repeat echocardiogram on 09/13/2020 revealing an EF of 60 to 65%.  Had mammogram on 10/23/2020 reported as BI-RADS Category 2 or benign.  Recommend diagnostic mammogram in 1 year.  Today, she is doing well.  States her energy level has improved since diagnosis of atrial fibrillation.  Her heart rate is now normal and in normal sinus rhythm.  She has close follow-up with cardiology.  She continues to workout 4 to 5 days/week with her husband.  She has also changed her diet.  She has intentional weight loss.  She has persistent right knee pain that has improved with injections by Dr. Johnell Comings.  She will have repeat lab work done at Dr. Juel Burrow office.   REVIEW OF SYSTEMS:   Review of Systems  Constitutional: Negative for appetite change and fatigue.  Gastrointestinal: Negative for constipation.  All other systems reviewed and are negative.   I have reviewed the past medical history, past surgical history, social history and family history with the patient and they are unchanged from  previous note.   ALLERGIES:   is allergic to tape.   MEDICATIONS:  Current Outpatient Medications  Medication Sig Dispense Refill  . ALPRAZolam (XANAX) 0.5 MG tablet Take 0.5 mg by mouth daily as needed for anxiety.     Marland Kitchen apixaban (ELIQUIS) 5 MG TABS tablet Take 1 tablet (5 mg total) by mouth 2 (two) times daily. 180 tablet 3  . Biotin 10000 MCG TABS daily.     Marland Kitchen buPROPion (WELLBUTRIN XL) 300 MG 24 hr tablet Take 300 mg by mouth at bedtime.    . Calcium Carb-Cholecalciferol (CALCIUM 600/VITAMIN D3 PO) Take 1 capsule by mouth daily.    . Cholecalciferol (VITAMIN D3) 125 MCG (5000 UT) CAPS Take 1 capsule by mouth daily. W/ calcium    . diltiazem (CARDIZEM CD) 120 MG 24 hr capsule Take 1 capsule (120 mg total) by mouth daily. 90 capsule 3  . diltiazem (CARDIZEM) 30 MG tablet Take 1 tablet (30 mg total) by mouth 4 (four) times daily as needed (palpitations or racing heartbeat). 30 tablet 0  . ezetimibe (ZETIA) 10 MG tablet Take 10 mg by mouth daily.     . fluticasone (FLONASE) 50 MCG/ACT nasal spray Place 1-2 sprays into both nostrils daily as needed for allergies or rhinitis.     Marland Kitchen levocetirizine (XYZAL) 5 MG tablet Take 5 mg by mouth in the morning.     Marland Kitchen omeprazole (PRILOSEC) 40 MG capsule Take 40 mg by mouth daily before breakfast.     .  tetrahydrozoline-zinc (VISINE-AC) 0.05-0.25 % ophthalmic solution Place 1-2 drops into both eyes 3 (three) times daily as needed (for irritated/itchy/allergy eyes.).      No current facility-administered medications for this visit.     PHYSICAL EXAMINATION: Performance status (ECOG): 1 - Symptomatic but completely ambulatory  There were no vitals filed for this visit. Wt Readings from Last 3 Encounters:  09/07/20 207 lb 9.6 oz (94.2 kg)  09/04/20 205 lb (93 kg)  09/04/20 210 lb 6.4 oz (95.4 kg)   Physical Exam Vitals reviewed.  Constitutional:      Appearance: Normal appearance. She is obese.  Cardiovascular:     Rate and Rhythm: Normal rate  and regular rhythm.     Pulses: Normal pulses.     Heart sounds: Normal heart sounds.  Pulmonary:     Effort: Pulmonary effort is normal.     Breath sounds: Normal breath sounds.  Chest:  Breasts:     Right: No tenderness or supraclavicular adenopathy.     Left: No tenderness or supraclavicular adenopathy.    Abdominal:     Palpations: Abdomen is soft. There is no mass.     Tenderness: There is no abdominal tenderness.  Musculoskeletal:     Right lower leg: No edema.     Left lower leg: No edema.  Lymphadenopathy:     Upper Body:     Right upper body: No supraclavicular or pectoral adenopathy.     Left upper body: No supraclavicular or pectoral adenopathy.  Neurological:     General: No focal deficit present.     Mental Status: She is alert and oriented to person, place, and time.  Psychiatric:        Mood and Affect: Mood normal.        Behavior: Behavior normal.     Breast Exam Chaperone: Milinda Antis, MD     LABORATORY DATA:  I have reviewed the data as listed CMP Latest Ref Rng & Units 09/04/2020 07/24/2020 01/26/2020  Glucose 70 - 99 mg/dL 91 83 94  BUN 8 - 23 mg/dL 13 11 15   Creatinine 0.44 - 1.00 mg/dL 0.98 0.87 0.84  Sodium 135 - 145 mmol/L 136 141 144  Potassium 3.5 - 5.1 mmol/L 3.6 4.4 4.6  Chloride 98 - 111 mmol/L 99 104 105  CO2 22 - 32 mmol/L 21(L) 28 22  Calcium 8.9 - 10.3 mg/dL 9.3 9.4 9.4  Total Protein 6.5 - 8.1 g/dL 7.1 - -  Total Bilirubin 0.3 - 1.2 mg/dL 1.1 - -  Alkaline Phos 38 - 126 U/L 72 - -  AST 15 - 41 U/L 32 - -  ALT 0 - 44 U/L 55(H) - -   No results found for: TKW409 Lab Results  Component Value Date   WBC 7.8 09/04/2020   HGB 14.6 09/04/2020   HCT 44.4 09/04/2020   MCV 94.5 09/04/2020   PLT 275 09/04/2020   NEUTROABS 5.6 09/04/2020    ASSESSMENT: 1.  Stage Ia right breast IDC: -Status post lumpectomy and SLNB on 04/30/2017 in [place].  Completed adjuvant radiation on 07/08/2017. -Arimidex started on 06/03/2017,  discontinued secondary to side effects.  Femara was also discontinued secondary to dry vaginitis. -Right breast biopsy on 10/12/2018 shows scant benign adipose tissue with fibrosis with no malignancy. -Mammogram on 10/18/2019 was BI-RADS Category 2.  2.  Osteopenia: -Bone density on 08/21/2017 shows T score -1.1. -Repeat bone density from 09/05/2019 reveals a T score of -1.1.   PLAN:  1.  Stage Ia right breast IDC: -She could not tolerate any antiestrogen therapy. -She did not bring lab results from PCPs office but was told they were "normal". -She would like to have labs done with Dr. Nevada Crane. -Most recent mammogram from December 2021 was BI-RADS 2-benign -We will plan to see her back in 6 months for follow-up.    2.  Osteopenia: -Repeat bone density in October 2022.  -She stopped taking calcium and vitamin D supplements.  She reports that her last vitamin D was normal.  Disposition: -RTC in 6 months with lab work from Dr. Juel Burrow office.  -Repeat mammogram in January 2023 (patient preference). -Repeat DEXA in October 2022.  No orders of the defined types were placed in this encounter.  The patient has a good understanding of the overall plan. she agrees with it. she will call with any problems that may develop before the next visit here.  Greater than 50% was spent in counseling and coordination of care with this patient including but not limited to discussion of the relevant topics above (See A&P) including, but not limited to diagnosis and management of acute and chronic medical conditions.   Faythe Casa, NP 12/11/2020 9:16 AM  [place] [phone]

## 2020-12-13 ENCOUNTER — Other Ambulatory Visit: Payer: Self-pay

## 2020-12-13 ENCOUNTER — Encounter: Payer: Self-pay | Admitting: Cardiology

## 2020-12-13 ENCOUNTER — Encounter: Payer: Self-pay | Admitting: *Deleted

## 2020-12-13 ENCOUNTER — Ambulatory Visit: Payer: Medicare Other | Admitting: Cardiology

## 2020-12-13 VITALS — BP 108/72 | HR 83 | Ht 64.0 in | Wt 200.0 lb

## 2020-12-13 DIAGNOSIS — I4892 Unspecified atrial flutter: Secondary | ICD-10-CM | POA: Diagnosis not present

## 2020-12-13 NOTE — Patient Instructions (Addendum)

## 2020-12-13 NOTE — Progress Notes (Signed)
Cardiology Office Note  Date: 12/13/2020   ID: Kathy Evans, Kathy Evans Aug 09, 1951, MRN [id number]  PCP:  Celene Squibb, MD  Cardiologist:  Rozann Lesches, MD Electrophysiologist:  None   Chief Complaint  Patient presents with  . Cardiac follow-up    History of Present Illness: Kathy Evans is a 70 y.o. female last seen in October 2021 by Ms. Barrett PA-C.  She presents today with her husband for a follow-up visit.  Since last encounter she does not report any significant palpitations, no obvious recurrent atrial flutter.  She has been exercising at the gym, uses an elliptical machine.  Feels a little unusual when she gets her heart rate up too fast, but otherwise has been doing well.  I reviewed her medications which are outlined below.  She is tolerating Cardizem CD 120 mg daily, no bleeding problems on Eliquis.  We are requesting her recent lab work from Dr. Nevada Evans.  I reviewed her echocardiogram from October 2021 as noted below.  Past Medical History:  Diagnosis Date  . Arthritis   . Atrial flutter [facility])    Diagnosed October 2021  . Breast cancer (Kathy Evans) 03/2017   Right  . Bronchitis   . Depression   . GERD (gastroesophageal reflux disease)   . Hyperlipidemia   . IBS (irritable bowel syndrome)   . Impingement syndrome of left shoulder   . Personal history of radiation therapy 2018    Past Surgical History:  Procedure Laterality Date  . ADENOIDECTOMY    . APPENDECTOMY    . BREAST BIOPSY Left    benign  . BREAST LUMPECTOMY Right 2018  . BUNIONECTOMY Right   . CARDIAC CATHETERIZATION    . CATARACT EXTRACTION, BILATERAL    . CHOLECYSTECTOMY    . COLONOSCOPY WITH PROPOFOL N/A 02/02/2018   Dr. Oneida Alar: multiple small and large-mouthed diverticula in recto-sigmoid colon, sigmoid, and descending. TI normal. Internal hemorrhoids during retroflexion, small. Moderate external hemorrhoids  . Full mastectomy Right   . MASTECTOMY, PARTIAL Right   . PROLAPSED UTERINE FIBROID  LIGATION    . TONSILLECTOMY    . TUBAL LIGATION    . VAGINAL HYSTERECTOMY    . VESICOVAGINAL FISTULA CLOSURE      Current Outpatient Medications  Medication Sig Dispense Refill  . ALPRAZolam (XANAX) 0.5 MG tablet Take 0.5 mg by mouth daily as needed for anxiety.     . Ascorbic Acid (VITAMIN C) 1000 MG tablet Take 1 tablet by mouth daily. With Zinc    . buPROPion (WELLBUTRIN XL) 300 MG 24 hr tablet Take 300 mg by mouth at bedtime.    Marland Kitchen diltiazem (CARDIZEM CD) 120 MG 24 hr capsule Take 1 capsule (120 mg total) by mouth daily. 90 capsule 3  . ezetimibe (ZETIA) 10 MG tablet Take 10 mg by mouth daily.     . fluticasone (FLONASE) 50 MCG/ACT nasal spray Place 1-2 sprays into both nostrils daily as needed for allergies or rhinitis.     Marland Kitchen levocetirizine (XYZAL) 5 MG tablet Take 5 mg by mouth in the morning.     . Multiple Vitamins-Minerals (CENTRUM SILVER PO) Take 1 tablet by mouth daily.    Marland Kitchen omeprazole (PRILOSEC) 40 MG capsule Take 40 mg by mouth daily before breakfast.     . tetrahydrozoline-zinc (VISINE-AC) 0.05-0.25 % ophthalmic solution Place 1-2 drops into both eyes 3 (three) times daily as needed (for irritated/itchy/allergy eyes.).     Marland Kitchen apixaban (ELIQUIS) 5 MG TABS tablet Take 1 tablet (5  mg total) by mouth 2 (two) times daily. 180 tablet 3   No current facility-administered medications for this visit.   Allergies:  Tape   ROS: No syncope.  Physical Exam: VS:  BP 108/72   Pulse 83   Ht 5\' 4"  (1.626 m)   Wt 200 lb (90.7 kg)   SpO2 96%   BMI 34.33 kg/m , BMI Body mass index is 34.33 kg/m.  Wt Readings from Last 3 Encounters:  12/13/20 200 lb (90.7 kg)  12/11/20 201 lb (91.2 kg)  09/07/20 207 lb 9.6 oz (94.2 kg)    General: Patient appears comfortable at rest. HEENT: Conjunctiva and lids normal, wearing a mask. Neck: Supple, no elevated JVP or carotid bruits, no thyromegaly. Lungs: Clear to auscultation, nonlabored breathing at rest. Cardiac: Regular rate and rhythm, no  S3, soft systolic murmur, no pericardial rub. Extremities: No pitting edema.  ECG:  An ECG dated 09/04/2020 was personally reviewed today and demonstrated:  Probable atrial flutter with 2:1 block.  Recent Labwork: 09/04/2020: ALT 55; AST 32; BUN 13; Creatinine, Ser 0.98; Hemoglobin 14.6; Platelets 275; Potassium 3.6; Sodium 136 09/10/2020: TSH 1.26   Other Studies Reviewed Today:  Echocardiogram 09/13/2020: 1. Left ventricular ejection fraction, by estimation, is 60 to 65%. The  left ventricle has normal function. The left ventricle has no regional  wall motion abnormalities. Left ventricular diastolic parameters are  indeterminate.  2. Right ventricular systolic function is normal. The right ventricular  size is normal. There is normal pulmonary artery systolic pressure. The  estimated right ventricular systolic pressure is 94.5 mmHg.  3. Left atrial size was mildly dilated.  4. The mitral valve is grossly normal. Mild mitral valve regurgitation.  5. The aortic valve is tricuspid. Aortic valve regurgitation is mild.  Aortic regurgitation PHT measures 560 msec.  6. The inferior vena cava is normal in size with greater than 50%  respiratory variability, suggesting right atrial pressure of 3 mmHg.   Assessment and Plan:  1.  History of transient atrial flutter, maintaining sinus rhythm on Cardizem CD 120 mg daily.  CHA2DS2-VASc score is 2 and she is on Eliquis for stroke prophylaxis, no reported bleeding problems.  Requesting interval lab work from Dr. Nevada Evans.  No changes were made today.  2.  Reassuring cardiac CTA with calcium score of 0 and normal coronary arteries.  Medication Adjustments/Labs and Tests Ordered: Current medicines are reviewed at length with the patient today.  Concerns regarding medicines are outlined above.   Tests Ordered: No orders of the defined types were placed in this encounter.   Medication Changes: No orders of the defined types were placed in  this encounter.   Disposition:  Follow up 6 months.  Signed, Satira Sark, MD, [facility] 12/13/2020 3:47 PM    [facility], [place], [place] [address] Phone: [phone]; Fax: [phone]

## 2020-12-18 ENCOUNTER — Other Ambulatory Visit: Payer: Medicare Other

## 2020-12-18 DIAGNOSIS — Z20822 Contact with and (suspected) exposure to covid-19: Secondary | ICD-10-CM | POA: Diagnosis not present

## 2020-12-19 LAB — SARS-COV-2, NAA 2 DAY TAT

## 2020-12-19 LAB — NOVEL CORONAVIRUS, NAA: SARS-CoV-2, NAA: NOT DETECTED

## 2021-01-02 DIAGNOSIS — Z961 Presence of intraocular lens: Secondary | ICD-10-CM | POA: Diagnosis not present

## 2021-01-02 DIAGNOSIS — H524 Presbyopia: Secondary | ICD-10-CM | POA: Diagnosis not present

## 2021-01-02 DIAGNOSIS — H5212 Myopia, left eye: Secondary | ICD-10-CM | POA: Diagnosis not present

## 2021-01-14 DIAGNOSIS — G720 Drug-induced myopathy: Secondary | ICD-10-CM | POA: Diagnosis not present

## 2021-01-14 DIAGNOSIS — C50911 Malignant neoplasm of unspecified site of right female breast: Secondary | ICD-10-CM | POA: Diagnosis not present

## 2021-01-14 DIAGNOSIS — E782 Mixed hyperlipidemia: Secondary | ICD-10-CM | POA: Diagnosis not present

## 2021-01-14 DIAGNOSIS — E7849 Other hyperlipidemia: Secondary | ICD-10-CM | POA: Diagnosis not present

## 2021-01-14 DIAGNOSIS — I10 Essential (primary) hypertension: Secondary | ICD-10-CM | POA: Diagnosis not present

## 2021-01-14 DIAGNOSIS — K589 Irritable bowel syndrome without diarrhea: Secondary | ICD-10-CM | POA: Diagnosis not present

## 2021-01-14 DIAGNOSIS — M8580 Other specified disorders of bone density and structure, unspecified site: Secondary | ICD-10-CM | POA: Diagnosis not present

## 2021-01-14 DIAGNOSIS — K219 Gastro-esophageal reflux disease without esophagitis: Secondary | ICD-10-CM | POA: Diagnosis not present

## 2021-02-13 DIAGNOSIS — K219 Gastro-esophageal reflux disease without esophagitis: Secondary | ICD-10-CM | POA: Diagnosis not present

## 2021-02-13 DIAGNOSIS — E7849 Other hyperlipidemia: Secondary | ICD-10-CM | POA: Diagnosis not present

## 2021-02-13 DIAGNOSIS — M8580 Other specified disorders of bone density and structure, unspecified site: Secondary | ICD-10-CM | POA: Diagnosis not present

## 2021-02-13 DIAGNOSIS — K589 Irritable bowel syndrome without diarrhea: Secondary | ICD-10-CM | POA: Diagnosis not present

## 2021-02-13 DIAGNOSIS — C50911 Malignant neoplasm of unspecified site of right female breast: Secondary | ICD-10-CM | POA: Diagnosis not present

## 2021-02-13 DIAGNOSIS — G720 Drug-induced myopathy: Secondary | ICD-10-CM | POA: Diagnosis not present

## 2021-02-13 DIAGNOSIS — I10 Essential (primary) hypertension: Secondary | ICD-10-CM | POA: Diagnosis not present

## 2021-02-13 DIAGNOSIS — E782 Mixed hyperlipidemia: Secondary | ICD-10-CM | POA: Diagnosis not present

## 2021-02-23 DIAGNOSIS — J0100 Acute maxillary sinusitis, unspecified: Secondary | ICD-10-CM | POA: Diagnosis not present

## 2021-02-23 DIAGNOSIS — R051 Acute cough: Secondary | ICD-10-CM | POA: Diagnosis not present

## 2021-02-23 DIAGNOSIS — Z1152 Encounter for screening for COVID-19: Secondary | ICD-10-CM | POA: Diagnosis not present

## 2021-02-23 DIAGNOSIS — R5383 Other fatigue: Secondary | ICD-10-CM | POA: Diagnosis not present

## 2021-02-23 DIAGNOSIS — U071 COVID-19: Secondary | ICD-10-CM | POA: Diagnosis not present

## 2021-03-04 DIAGNOSIS — J0100 Acute maxillary sinusitis, unspecified: Secondary | ICD-10-CM | POA: Diagnosis not present

## 2021-03-04 DIAGNOSIS — R051 Acute cough: Secondary | ICD-10-CM | POA: Diagnosis not present

## 2021-03-04 DIAGNOSIS — Z1152 Encounter for screening for COVID-19: Secondary | ICD-10-CM | POA: Diagnosis not present

## 2021-03-04 DIAGNOSIS — U071 COVID-19: Secondary | ICD-10-CM | POA: Diagnosis not present

## 2021-03-17 DIAGNOSIS — K219 Gastro-esophageal reflux disease without esophagitis: Secondary | ICD-10-CM | POA: Diagnosis not present

## 2021-04-02 ENCOUNTER — Telehealth: Payer: Self-pay | Admitting: Cardiology

## 2021-04-02 NOTE — Telephone Encounter (Signed)
Reports heart dropping in the 40's after being on prednisone for covid in April 2022 that was being monitored by her FitBit. Heart rate now 69. Heart rate 111 yesterday while playing cards. Reports blood pressure is normal. Reports sob and fatigue easily since having covid. Denies dizziness or chest pain. Advised to contact PCP for an evaluation for post covid symptoms. First available appointment arranged 04/22/21 2:30 pm with Richardson Dopp. Verbalized understanding of plan.

## 2021-04-02 NOTE — Telephone Encounter (Signed)
Per phone call from pt- stating that her heart rate has been fluctuating ever since she's had Covid- she tested positive 02/25/2021  Please give pt a call

## 2021-04-04 DIAGNOSIS — R5381 Other malaise: Secondary | ICD-10-CM | POA: Insufficient documentation

## 2021-04-04 DIAGNOSIS — R001 Bradycardia, unspecified: Secondary | ICD-10-CM | POA: Insufficient documentation

## 2021-04-04 DIAGNOSIS — Z8616 Personal history of COVID-19: Secondary | ICD-10-CM | POA: Diagnosis not present

## 2021-04-21 NOTE — Progress Notes (Signed)
Cardiology Office Note:    Date:  04/22/2021   ID:  Kathy Evans, DOB 12-19-1950, MRN [id number]  PCP:  Kathy Squibb, MD   [facility] Prov[facility] Cardiologist:  Kathy Lesches, MD     Referring MD: Kathy Squibb, MD   Chief Complaint:  No chief complaint on file.    Patient Profile:    Kathy Evans is a 70 y.o. female with:   Paroxysmal atrial flutter CHA2DS2-VASc Score = 2 with an annual risk of stroke of 2.2 %. >> Apixaban   No CAD; Ca2+ score 0 on CT in 07/2020  Breast CA (R)  Hyperlipidemia   GERD   Prior CV studies:  Echocardiogram 09/13/20 EF 60-65, no RWMA, normal RVSF, PASP 33, mild LAE mild MR, mild AI  CT CORONARY MORPH W/CTA COR W/SCORE W/CA W/CM &/OR WO/CM 07/26/2020 IMPRESSION: 1. Coronary calcium score of 0. Patient is low risk for near term coronary events 2. Normal coronary origin with left dominance. 3. No evidence of CAD. 4. CAD-RADS 0. Consider non-atherosclerotic causes of chest pain.    History of Present Illness: Kathy Evans was last seen by Dr. Domenic Evans in clinic in 1/22.  She returns for labile HRs since she had COVID-19 in April 2022 requiring tx with steroids.  She is here alone.  She notes that she had heart rates in the 40s while on dexamethasone.  She stopped this medication.  Her heart rates have stayed in the 60s since then.  She has had elevations in her heart rate into the low 100s at times.  She has been trying to exercise.  Her heart rate gets up into the 120s to 140s during exercise.  Sometimes he gets higher and she stopped exercise earlier.  She still feels somewhat short of breath with activity.  Her symptoms with COVID were mainly fatigue, congestion and sore throat.  Her PCP recently gave her an inhaler to help with post-COVID shortness of breath.  She has not had syncope, orthopnea, leg edema.  She had some lower left chest discomfort but this has resolved.  She has not had pleuritic symptoms or chest pain with lying supine.     Past Medical History:  Diagnosis Date  . Arthritis   . Atrial flutter [facility])    Diagnosed October 2021  . Breast cancer ([place]) 03/2017   Right  . Bronchitis   . Depression   . GERD (gastroesophageal reflux disease)   . Hyperlipidemia   . IBS (irritable bowel syndrome)   . Impingement syndrome of left shoulder   . Personal history of radiation therapy 2018    Current Medications: Current Meds  Medication Sig  . ALPRAZolam (XANAX) 0.5 MG tablet Take 0.5 mg by mouth daily as needed for anxiety.   Marland Kitchen apixaban (ELIQUIS) 5 MG TABS tablet Take 1 tablet (5 mg total) by mouth 2 (two) times daily.  Marland Kitchen buPROPion (WELLBUTRIN XL) 300 MG 24 hr tablet Take 300 mg by mouth at bedtime.  Marland Kitchen diltiazem (CARDIZEM CD) 120 MG 24 hr capsule Take 1 capsule (120 mg total) by mouth daily.  Marland Kitchen ezetimibe (ZETIA) 10 MG tablet Take 10 mg by mouth daily.   . fluticasone (FLONASE) 50 MCG/ACT nasal spray Place 1-2 sprays into both nostrils daily as needed for allergies or rhinitis.   Marland Kitchen levocetirizine (XYZAL) 5 MG tablet Take 5 mg by mouth in the morning.   Marland Kitchen omeprazole (PRILOSEC) 40 MG capsule Take 40 mg by mouth daily before breakfast.  Allergies:   Tape   Social History   Tobacco Use  . Smoking status: Former Smoker    Packs/day: 1.50    Years: 35.00    Pack years: 52.50    Types: Cigarettes    Quit date: 2000    Years since quitting: 22.4  . Smokeless tobacco: Never Used  Vaping Use  . Vaping Use: Never used  Substance Use Topics  . Alcohol use: Yes    Comment: very seldom  . Drug use: No     Family Hx: The patient's family history includes Alzheimer's disease in her maternal grandmother; Cancer in her mother, sister, and sister; Diabetes in her brother, maternal grandmother, mother, sister, and sister; Heart disease in her brother, brother, father, and sister; High blood pressure in her sister. There is no history of Colon cancer.  ROS   EKGs/Labs/Other Test Reviewed:    EKG:  EKG is    ordered today.  The ekg ordered today demonstrates normal sinus rhythm, heart rate 59, leftward axis, nonspecific ST-T wave changes, QTC 419  Recent Labs: 09/04/2020: ALT 55; BUN 13; Creatinine, Ser 0.98; Hemoglobin 14.6; Platelets 275; Potassium 3.6; Sodium 136 09/10/2020: TSH 1.26   Recent Lipid Panel No results found for: CHOL, TRIG, HDL, LDLCALC, LDLDIRECT    Risk Assessment/Calculations:    CHA2DS2-VASc Score = 2  {Confirm score is correct.  If not, click here to update score.  REFRESH note. :[id number]}TIRW indicates a 2.2% annual risk of stroke. The patient's score is based upon: CHF History: No HTN History: No Diabetes History: No Stroke History: No Vascular Disease History: No Age Score: 1 Gender Score: 1      Physical Exam:    VS:  BP (!) 144/76   Pulse 68   Ht 5' 3.5" (1.613 m)   Wt 198 lb 3.2 oz (89.9 kg)   SpO2 97%   BMI 34.56 kg/m     Wt Readings from Last 3 Encounters:  04/22/21 198 lb 3.2 oz (89.9 kg)  12/13/20 200 lb (90.7 kg)  12/11/20 201 lb (91.2 kg)     Constitutional:      Appearance: Healthy appearance. Not in distress.  Neck:     Vascular: JVD normal.  Pulmonary:     Effort: Pulmonary effort is normal.     Breath sounds: No wheezing. No rales.  Cardiovascular:     Normal rate. Regular rhythm. Normal S1. Normal S2.     Murmurs: There is no murmur.  Edema:    Peripheral edema absent.  Abdominal:     Palpations: Abdomen is soft. There is no hepatomegaly.  Skin:    General: Skin is warm and dry.  Neurological:     Mental Status: Alert and oriented to person, place and time.     Cranial Nerves: Cranial nerves are intact.          ASSESSMENT & PLAN:    1. Paroxysmal atrial flutter (HCC) Currently maintaining sinus rhythm.  She remains on anticoagulation with Apixaban.  We discussed proceeding with a 14-day Zio patch monitor to better assess her rhythm given her fluctuations in heart rate.  She would like to hold off on this for now.   I think that this is reasonable.  Typically has resumption of normal heart rate after several seconds.  I did consider giving her a as needed beta-blocker.  However, given the short duration of her symptoms, I do not think that this is necessary.  She knows to contact us if  her symptoms do not improve over the next several weeks.  At that point, I would encourage her to get a 14-day monitor.  Otherwise, she can see Dr. Domenic Evans in 3 to 4 months.  2. Mixed hyperlipidemia She remains on ezetimibe therapy.  3. History of COVID-19 Given her history of heart rate fluctuations and recent history of COVID, we did discuss possibly proceeding with an echocardiogram.  However, at this time, she prefers to hold off.  As noted above, she will contact us if her symptoms continue.  At that point, I would proceed with a limited echocardiogram to reassess her LV function         Dispo:  Return in about 4 months (around 08/22/2021) for Routine Follow Up with Dr. Domenic Evans.   Medication Adjustments/Labs and Tests Ordered: Current medicines are reviewed at length with the patient today.  Concerns regarding medicines are outlined above.  Tests Ordered: No orders of the defined types were placed in this encounter.  Medication Changes: No orders of the defined types were placed in this encounter.   Signed, Richardson Dopp, PA-C  04/22/2021 3:33 PM    [facility] [address] [place], [place], [place]  [address] Phone: [phone]; Fax: [phone]

## 2021-04-22 ENCOUNTER — Ambulatory Visit: Payer: Medicare Other | Admitting: Physician Assistant

## 2021-04-22 ENCOUNTER — Other Ambulatory Visit: Payer: Self-pay

## 2021-04-22 ENCOUNTER — Encounter: Payer: Self-pay | Admitting: Physician Assistant

## 2021-04-22 VITALS — BP 144/76 | HR 68 | Ht 63.5 in | Wt 198.2 lb

## 2021-04-22 DIAGNOSIS — I4892 Unspecified atrial flutter: Secondary | ICD-10-CM

## 2021-04-22 DIAGNOSIS — E782 Mixed hyperlipidemia: Secondary | ICD-10-CM | POA: Diagnosis not present

## 2021-04-22 DIAGNOSIS — Z8616 Personal history of COVID-19: Secondary | ICD-10-CM

## 2021-04-22 NOTE — Patient Instructions (Signed)
Medication Instructions:  Your physician recommends that you continue on your current medications as directed. Please refer to the Current Medication list given to you today.  *If you need a refill on your cardiac medications before your next appointment, please call your pharmacy*   Lab Work: None today  If you have labs (blood work) drawn today and your tests are completely normal, you will receive your results only by: Marland Kitchen MyChart Message (if you have MyChart) OR . A paper copy in the mail If you have any lab test that is abnormal or we need to change your treatment, we will call you to review the results.   Testing/Procedures: None today    Follow-Up: At [facility], you and your health needs are our priority.  As part of our continuing mission to provide you with exceptional heart care, we have created designated Provider Care Teams.  These Care Teams include your primary Cardiologist (physician) and Advanced Practice Providers (APPs -  Physician Assistants and Nurse Practitioners) who all work together to provide you with the care you need, when you need it.  We recommend signing up for the patient portal called "MyChart".  Sign up information is provided on this After Visit Summary.  MyChart is used to connect with patients for Virtual Visits (Telemedicine).  Patients are able to view lab/test results, encounter notes, upcoming appointments, etc.  Non-urgent messages can be sent to your provider as well.   To learn more about what you can do with MyChart, go to [email or web address].    Your next appointment:   4 month(s)  The format for your next appointment:   In Person  Provider:   You may see Rozann Lesches, MD    Other Instructions None

## 2021-04-23 NOTE — Addendum Note (Signed)
Addended by: Christella Scheuermann C on: 04/23/2021 11:13 AM   Modules accepted: Orders

## 2021-05-01 DIAGNOSIS — I10 Essential (primary) hypertension: Secondary | ICD-10-CM | POA: Insufficient documentation

## 2021-05-02 DIAGNOSIS — Z0000 Encounter for general adult medical examination without abnormal findings: Secondary | ICD-10-CM | POA: Diagnosis not present

## 2021-05-02 DIAGNOSIS — Z0001 Encounter for general adult medical examination with abnormal findings: Secondary | ICD-10-CM | POA: Diagnosis not present

## 2021-05-02 DIAGNOSIS — I10 Essential (primary) hypertension: Secondary | ICD-10-CM | POA: Diagnosis not present

## 2021-05-16 DIAGNOSIS — K219 Gastro-esophageal reflux disease without esophagitis: Secondary | ICD-10-CM | POA: Diagnosis not present

## 2021-06-12 ENCOUNTER — Encounter (HOSPITAL_COMMUNITY): Payer: Self-pay

## 2021-06-12 ENCOUNTER — Ambulatory Visit (HOSPITAL_COMMUNITY): Payer: Medicare Other | Admitting: Hematology and Oncology

## 2021-06-13 ENCOUNTER — Ambulatory Visit: Payer: Medicare Other | Admitting: Cardiology

## 2021-06-13 DIAGNOSIS — M25551 Pain in right hip: Secondary | ICD-10-CM | POA: Diagnosis not present

## 2021-06-24 DIAGNOSIS — M25551 Pain in right hip: Secondary | ICD-10-CM | POA: Diagnosis not present

## 2021-07-16 NOTE — Progress Notes (Signed)
[facility] [address], [place] [address]   Patient Care Team: Celene Squibb, MD as PCP - General (Internal Medicine) Satira Sark, MD as PCP - Cardiology (Cardiology) Danie Binder, MD (Inactive) as Consulting Physician (Gastroenterology)  CHIEF COMPLIANT: Follow-up of right breast cancer    INTERVAL HISTORY: [name] is a 70 y.o. female here today for follow up of her right breast cancer. Her last visit was on 04/30/2020. She is scheduled for a DEXA scan in October 2022, and her mammogram is scheduled for January 2023.   Today she reports feeling good. She is taking 5000 units of  Vitamin D3 daily, and she is not currently taking Calcium. She reports knee and hip pain bilaterally. She took anastrozole for 6 months.   REVIEW OF SYSTEMS:   Review of Systems  Constitutional:  Negative for appetite change and fatigue.  Cardiovascular:  Positive for palpitations (A-fib).  Musculoskeletal:  Positive for arthralgias (knee and hip).  Psychiatric/Behavioral:  Positive for sleep disturbance (disrupted).   All other systems reviewed and are negative.  I have reviewed the past medical history, past surgical history, social history and family history with the patient and they are unchanged from previous note.   ALLERGIES:   is allergic to tape.   MEDICATIONS:  Current Outpatient Medications  Medication Sig Dispense Refill   ALPRAZolam (XANAX) 0.5 MG tablet Take 0.5 mg by mouth daily as needed for anxiety.      apixaban (ELIQUIS) 5 MG TABS tablet Take 1 tablet (5 mg total) by mouth 2 (two) times daily. 180 tablet 3   buPROPion (WELLBUTRIN XL) 300 MG 24 hr tablet Take 300 mg by mouth at bedtime.     diltiazem (CARDIZEM CD) 120 MG 24 hr capsule Take 1 capsule (120 mg total) by mouth daily. 90 capsule 3   ezetimibe (ZETIA) 10 MG tablet Take 10 mg by mouth daily.      fluticasone (FLONASE) 50 MCG/ACT nasal spray Place 1-2 sprays into both nostrils  daily as needed for allergies or rhinitis.      levocetirizine (XYZAL) 5 MG tablet Take 5 mg by mouth in the morning.      omeprazole (PRILOSEC) 40 MG capsule Take 40 mg by mouth daily before breakfast.      No current facility-administered medications for this visit.     PHYSICAL EXAMINATION: Performance status (ECOG): 1 - Symptomatic but completely ambulatory  There were no vitals filed for this visit. Wt Readings from Last 3 Encounters:  04/22/21 198 lb 3.2 oz (89.9 kg)  12/13/20 200 lb (90.7 kg)  12/11/20 201 lb (91.2 kg)   Physical Exam Vitals reviewed.  Constitutional:      Appearance: Normal appearance.  Cardiovascular:     Rate and Rhythm: Normal rate and regular rhythm.     Pulses: Normal pulses.     Heart sounds: Normal heart sounds.  Pulmonary:     Effort: Pulmonary effort is normal.     Breath sounds: Normal breath sounds.  Chest:  Breasts:    Right: Normal. No tenderness (lumpectomy UOQ and LIQ scars within normal limits).     Left: Normal.  Abdominal:     Palpations: Abdomen is soft. There is no hepatomegaly, splenomegaly or mass.     Tenderness: There is no abdominal tenderness.  Musculoskeletal:     Right lower leg: No edema.     Left lower leg: No edema.  Neurological:  General: No focal deficit present.     Mental Status: She is alert and oriented to person, place, and time.  Psychiatric:        Mood and Affect: Mood normal.        Behavior: Behavior normal.    Breast Exam Chaperone: Thana Ates     LABORATORY DATA:  I have reviewed the data as listed CMP Latest Ref Rng & Units 09/04/2020 07/24/2020 01/26/2020  Glucose 70 - 99 mg/dL 91 83 94  BUN 8 - 23 mg/dL '13 11 15  '$ Creatinine 0.44 - 1.00 mg/dL 0.98 0.87 0.84  Sodium 135 - 145 mmol/L 136 141 144  Potassium 3.5 - 5.1 mmol/L 3.6 4.4 4.6  Chloride 98 - 111 mmol/L 99 104 105  CO2 22 - 32 mmol/L 21(L) 28 22  Calcium 8.9 - 10.3 mg/dL 9.3 9.4 9.4  Total Protein 6.5 - 8.1 g/dL 7.1 - -  Total  Bilirubin 0.3 - 1.2 mg/dL 1.1 - -  Alkaline Phos 38 - 126 U/L 72 - -  AST 15 - 41 U/L 32 - -  ALT 0 - 44 U/L 55(H) - -   No results found for: [id number] Lab Results  Component Value Date   WBC 7.8 09/04/2020   HGB 14.6 09/04/2020   HCT 44.4 09/04/2020   MCV 94.5 09/04/2020   PLT 275 09/04/2020   NEUTROABS 5.6 09/04/2020    ASSESSMENT:  1.  Stage Ia right breast IDC: -Status post lumpectomy and SLNB on 04/30/2017 in [place].  Completed adjuvant radiation on 07/08/2017. - Anastrozole started around 06/03/2017, discontinued secondary to side effects after taking 6 months.  Femara was also discontinued secondary to dry vaginitis within a few weeks. -Right breast biopsy on 10/12/2018 shows scant benign adipose tissue with fibrosis with no malignancy. -Mammogram on 10/18/2019 was BI-RADS Category 2.   2.  Osteopenia: -Bone density on 08/21/2017 shows T score -1.1.   PLAN:  1.  Stage Ia right breast IDC: - Physical examination today did not reveal any palpable masses or adenopathy.  Lumpectomy scar in the right breast inner quadrant is stable. - Last mammogram was very traumatic for her.  She will have her next mammogram done in January 2023.  Last mammogram on 10/23/2020 was BI-RADS Category 2. - I have reviewed labs dated 05/02/2021 from Dr. Juel Burrow office which showed normal CBC.  Comprehensive metabolic panel was within normal limits. - RTC 6 months for follow-up and physical exam.  We will switch her to yearly visits after completion of first 5 years.   2.  Osteopenia: - Vitamin D level from Dr. Juel Burrow office from 05/02/2021 was minimally low at 27. - She is taking vitamin D 5000 units daily. - She will have bone density test in October of this year.  Breast Cancer therapy associated bone loss: I have recommended calcium, Vitamin D and weight bearing exercises.  Orders placed this encounter:  No orders of the defined types were placed in this encounter.   The patient has a good  understanding of the overall plan. She agrees with it. She will call with any problems that may develop before the next visit here.  Derek Jack, MD [place] [phone]   I, Thana Ates, am acting as a scribe for Dr. Derek Jack.  I, Derek Jack MD, have reviewed the above documentation for accuracy and completeness, and I agree with the above.

## 2021-07-17 ENCOUNTER — Inpatient Hospital Stay (HOSPITAL_COMMUNITY): Payer: Medicare Other | Attending: Hematology | Admitting: Hematology

## 2021-07-17 ENCOUNTER — Other Ambulatory Visit (HOSPITAL_COMMUNITY): Payer: Self-pay | Admitting: Hematology

## 2021-07-17 ENCOUNTER — Other Ambulatory Visit: Payer: Self-pay

## 2021-07-17 VITALS — BP 143/67 | HR 67 | Temp 98.4°F | Resp 16 | Wt 188.5 lb

## 2021-07-17 DIAGNOSIS — K219 Gastro-esophageal reflux disease without esophagitis: Secondary | ICD-10-CM | POA: Diagnosis not present

## 2021-07-17 DIAGNOSIS — Z79899 Other long term (current) drug therapy: Secondary | ICD-10-CM | POA: Insufficient documentation

## 2021-07-17 DIAGNOSIS — Z170 Estrogen receptor positive status [ER+]: Secondary | ICD-10-CM | POA: Diagnosis not present

## 2021-07-17 DIAGNOSIS — M8580 Other specified disorders of bone density and structure, unspecified site: Secondary | ICD-10-CM | POA: Insufficient documentation

## 2021-07-17 DIAGNOSIS — Z7901 Long term (current) use of anticoagulants: Secondary | ICD-10-CM | POA: Insufficient documentation

## 2021-07-17 DIAGNOSIS — C50911 Malignant neoplasm of unspecified site of right female breast: Secondary | ICD-10-CM

## 2021-07-17 DIAGNOSIS — Z923 Personal history of irradiation: Secondary | ICD-10-CM | POA: Insufficient documentation

## 2021-07-17 NOTE — Patient Instructions (Addendum)
[place] at [facility] Discharge Instructions  You were seen today by Dr. Delton Coombes. He went over your recent results and scans. You will be scheduled for a mammogram in January 2023. Dr. Delton Coombes will see you back in 6 months for labs and follow up.   Thank you for choosing [place] at [facility] to provide your oncology and hematology care.  To afford each patient quality time with our provider, please arrive at least 15 minutes before your scheduled appointment time.   If you have a lab appointment with the [facility] please come in thru the Main Entrance and check in at the main information desk  You need to re-schedule your appointment should you arrive 10 or more minutes late.  We strive to give you quality time with our providers, and arriving late affects you and other patients whose appointments are after yours.  Also, if you no show three or more times for appointments you may be dismissed from the clinic at the providers discretion.     Again, thank you for choosing [facility].  Our hope is that these requests will decrease the amount of time that you wait before being seen by our physicians.       _____________________________________________________________  Should you have questions after your visit to [facility], please contact our office at [phone] between the hours of 8:00 a.m. and 4:30 p.m.  Voicemails left after 4:00 p.m. will not be returned until the following business day.  For prescription refill requests, have your pharmacy contact our office and allow 72 hours.    Cancer Center Support Programs:   > Cancer Support Group  2nd Tuesday of the month 1pm-2pm, Journey Room

## 2021-08-16 DIAGNOSIS — K219 Gastro-esophageal reflux disease without esophagitis: Secondary | ICD-10-CM | POA: Diagnosis not present

## 2021-08-21 DIAGNOSIS — M25561 Pain in right knee: Secondary | ICD-10-CM | POA: Diagnosis not present

## 2021-08-21 DIAGNOSIS — M25562 Pain in left knee: Secondary | ICD-10-CM | POA: Diagnosis not present

## 2021-08-22 ENCOUNTER — Ambulatory Visit: Payer: Medicare Other | Admitting: Cardiology

## 2021-08-22 ENCOUNTER — Other Ambulatory Visit: Payer: Self-pay | Admitting: Cardiology

## 2021-08-22 ENCOUNTER — Encounter: Payer: Self-pay | Admitting: Cardiology

## 2021-08-22 VITALS — BP 138/68 | HR 56 | Ht 63.5 in | Wt 188.0 lb

## 2021-08-22 DIAGNOSIS — I4892 Unspecified atrial flutter: Secondary | ICD-10-CM

## 2021-08-22 DIAGNOSIS — E782 Mixed hyperlipidemia: Secondary | ICD-10-CM

## 2021-08-22 NOTE — Patient Instructions (Addendum)
Medication Instructions:   Your physician recommends that you continue on your current medications as directed. Please refer to the Current Medication list given to you today.  *If you need a refill on your cardiac medications before your next appointment, please call your pharmacy*   Lab Work: None today  If you have labs (blood work) drawn today and your tests are completely normal, you will receive your results only by: . MyChart Message (if you have MyChart) OR . A paper copy in the mail If you have any lab test that is abnormal or we need to change your treatment, we will call you to review the results.   Testing/Procedures: None today   Follow-Up: At [facility], you and your health needs are our priority.  As part of our continuing mission to provide you with exceptional heart care, we have created designated Provider Care Teams.  These Care Teams include your primary Cardiologist (physician) and Advanced Practice Providers (APPs -  Physician Assistants and Nurse Practitioners) who all work together to provide you with the care you need, when you need it.  We recommend signing up for the patient portal called "MyChart".  Sign up information is provided on this After Visit Summary.  MyChart is used to connect with patients for Virtual Visits (Telemedicine).  Patients are able to view lab/test results, encounter notes, upcoming appointments, etc.  Non-urgent messages can be sent to your provider as well.   To learn more about what you can do with MyChart, go to [email or web address].    Your next appointment:   12 month(s)  The format for your next appointment:   In Person  Provider:   Samuel McDowell, MD   Other Instructions None   

## 2021-08-22 NOTE — Progress Notes (Signed)
Cardiology Office Note  Date: 08/22/2021   ID: Kathy Evans, DOB 1951-08-29, MRN [id number]  PCP:  Celene Squibb, MD  Cardiologist:  Rozann Lesches, MD Electrophysiologist:  None   Chief Complaint  Patient presents with   Cardiac follow-up    History of Present Illness: Kathy Evans is a 70 y.o. female last seen in June by Mr. Jorene Minors.  She is here for a follow-up visit.  Since last encounter she does not report any significant palpitations.  She states that she has been taking her medications regularly, no spontaneous bleeding problems on Eliquis.  We are requesting her interval lab work from June.  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  Sounds by description like a potential intention tremor.  Would not be a typical side effect of Eliquis or Cardizem CD.  I reviewed her medications which are noted below.  Past Medical History:  Diagnosis Date   Arthritis    Atrial flutter [facility])    Diagnosed October 2021   Breast cancer ([place]) 03/2017   Right   Bronchitis    Depression    GERD (gastroesophageal reflux disease)    Hyperlipidemia    IBS (irritable bowel syndrome)    Impingement syndrome of left shoulder    Personal history of radiation therapy 2018    Past Surgical History:  Procedure Laterality Date   ADENOIDECTOMY     APPENDECTOMY     BREAST BIOPSY Left    benign   BREAST LUMPECTOMY Right 2018   BUNIONECTOMY Right    CARDIAC CATHETERIZATION     CATARACT EXTRACTION, BILATERAL     CHOLECYSTECTOMY     COLONOSCOPY WITH PROPOFOL N/A 02/02/2018   Dr. Oneida Alar: multiple small and large-mouthed diverticula in recto-sigmoid colon, sigmoid, and descending. TI normal. Internal hemorrhoids during retroflexion, small. Moderate external hemorrhoids   Full mastectomy Right    MASTECTOMY, PARTIAL Right    PROLAPSED UTERINE FIBROID LIGATION     TONSILLECTOMY     TUBAL LIGATION     VAGINAL HYSTERECTOMY      VESICOVAGINAL FISTULA CLOSURE      Current Outpatient Medications  Medication Sig Dispense Refill   ALPRAZolam (XANAX) 0.5 MG tablet Take 0.5 mg by mouth daily as needed for anxiety.      apixaban (ELIQUIS) 5 MG TABS tablet Take 1 tablet (5 mg total) by mouth 2 (two) times daily. 180 tablet 3   buPROPion (WELLBUTRIN XL) 300 MG 24 hr tablet Take 300 mg by mouth at bedtime.     Cholecalciferol (VITAMIN D3) 125 MCG (5000 UT) CAPS Take 1 capsule by mouth.     diltiazem (CARDIZEM CD) 120 MG 24 hr capsule Take 1 capsule (120 mg total) by mouth daily. 90 capsule 3   ezetimibe (ZETIA) 10 MG tablet Take 10 mg by mouth daily.      fluticasone (FLONASE) 50 MCG/ACT nasal spray Place 1-2 sprays into both nostrils daily as needed for allergies or rhinitis.      levocetirizine (XYZAL) 5 MG tablet Take 5 mg by mouth in the morning.      omeprazole (PRILOSEC) 40 MG capsule Take 40 mg by mouth daily before breakfast.      No current facility-administered medications for this visit.   Allergies:  Tape   ROS: No syncope.  Physical Exam: VS:  BP 138/68   Pulse (!) 56   Ht 5' 3.5" (1.613 m)   Wt  188 lb (85.3 kg)   SpO2 98%   BMI 32.78 kg/m , BMI Body mass index is 32.78 kg/m.  Wt Readings from Last 3 Encounters:  08/22/21 188 lb (85.3 kg)  07/17/21 188 lb 7.9 oz (85.5 kg)  04/22/21 198 lb 3.2 oz (89.9 kg)    General: Patient appears comfortable at rest. HEENT: Conjunctiva and lids normal, wearing a mask. Neck: Supple, no elevated JVP or carotid bruits, no thyromegaly. Lungs: Clear to auscultation, nonlabored breathing at rest. Cardiac: Regular rate and rhythm, no S3, 1/6 systolic murmur, no pericardial rub. Extremities: No pitting edema.  ECG:  An ECG dated 04/22/2021 was personally reviewed today and demonstrated:  Sinus bradycardia with leftward axis and nonspecific ST changes.  Recent Labwork: 09/04/2020: ALT 55; AST 32; BUN 13; Creatinine, Ser 0.98; Hemoglobin 14.6; Platelets 275; Potassium  3.6; Sodium 136 09/10/2020: TSH 1.26   Other Studies Reviewed Today:  Echocardiogram 09/13/2020:  1. Left ventricular ejection fraction, by estimation, is 60 to 65%. The  left ventricle has normal function. The left ventricle has no regional  wall motion abnormalities. Left ventricular diastolic parameters are  indeterminate.   2. Right ventricular systolic function is normal. The right ventricular  size is normal. There is normal pulmonary artery systolic pressure. The  estimated right ventricular systolic pressure is 31.5 mmHg.   3. Left atrial size was mildly dilated.   4. The mitral valve is grossly normal. Mild mitral valve regurgitation.   5. The aortic valve is tricuspid. Aortic valve regurgitation is mild.  Aortic regurgitation PHT measures 560 msec.   6. The inferior vena cava is normal in size with greater than 50%  respiratory variability, suggesting right atrial pressure of 3 mmHg.   Assessment and Plan:  1.  History of atrial flutter, possibly paroxysmal although with no major recurrences in the last several months.  CHA2DS2-VASc score is 2.  Plan is to continue observation on Eliquis for stroke prophylaxis and Cardizem CD.  Requesting interval lab work for review.  2.  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 Nevada Crane.  Unlikely to be related to either Cardizem CD or Eliquis.  3.  Mixed hyperlipidemia with statin intolerance.  She is on Zetia.  Medication Adjustments/Labs and Tests Ordered: Current medicines are reviewed at length with the patient today.  Concerns regarding medicines are outlined above.   Tests Ordered: No orders of the defined types were placed in this encounter.   Medication Changes: No orders of the defined types were placed in this encounter.   Disposition:  Follow up  1 year at patient request.  Signed, Satira Sark, MD, [facility] 08/22/2021 1:26 PM    [place] at [facility] [address], [place], [place] [address] Phone: [phone]; Fax: [phone]

## 2021-08-23 DIAGNOSIS — M179 Osteoarthritis of knee, unspecified: Secondary | ICD-10-CM | POA: Diagnosis not present

## 2021-08-23 DIAGNOSIS — Z0001 Encounter for general adult medical examination with abnormal findings: Secondary | ICD-10-CM | POA: Diagnosis not present

## 2021-08-23 DIAGNOSIS — K581 Irritable bowel syndrome with constipation: Secondary | ICD-10-CM | POA: Diagnosis not present

## 2021-08-23 DIAGNOSIS — M8580 Other specified disorders of bone density and structure, unspecified site: Secondary | ICD-10-CM | POA: Diagnosis not present

## 2021-08-23 DIAGNOSIS — I480 Paroxysmal atrial fibrillation: Secondary | ICD-10-CM | POA: Diagnosis not present

## 2021-08-23 DIAGNOSIS — E782 Mixed hyperlipidemia: Secondary | ICD-10-CM | POA: Diagnosis not present

## 2021-08-23 DIAGNOSIS — K219 Gastro-esophageal reflux disease without esophagitis: Secondary | ICD-10-CM | POA: Diagnosis not present

## 2021-08-23 DIAGNOSIS — Z23 Encounter for immunization: Secondary | ICD-10-CM | POA: Diagnosis not present

## 2021-09-05 DIAGNOSIS — M1711 Unilateral primary osteoarthritis, right knee: Secondary | ICD-10-CM | POA: Diagnosis not present

## 2021-09-10 ENCOUNTER — Other Ambulatory Visit: Payer: Self-pay

## 2021-09-10 ENCOUNTER — Ambulatory Visit (HOSPITAL_COMMUNITY)
Admission: RE | Admit: 2021-09-10 | Discharge: 2021-09-10 | Disposition: A | Discharge status: Home / Self Care | Payer: Medicare Other | Source: Ambulatory Visit | Attending: Oncology | Admitting: Oncology

## 2021-09-10 DIAGNOSIS — Z780 Asymptomatic menopausal state: Secondary | ICD-10-CM | POA: Diagnosis not present

## 2021-09-10 DIAGNOSIS — Z13820 Encounter for screening for osteoporosis: Secondary | ICD-10-CM | POA: Diagnosis not present

## 2021-09-10 DIAGNOSIS — Z853 Personal history of malignant neoplasm of breast: Secondary | ICD-10-CM | POA: Insufficient documentation

## 2021-09-10 DIAGNOSIS — M8589 Other specified disorders of bone density and structure, multiple sites: Secondary | ICD-10-CM | POA: Diagnosis not present

## 2021-09-11 ENCOUNTER — Telehealth: Payer: Self-pay | Admitting: Cardiology

## 2021-09-11 NOTE — Telephone Encounter (Signed)
Pt notified that she is to call PCP in regards to dose of calcium supplement.

## 2021-09-11 NOTE — Telephone Encounter (Signed)
Pt asking what strength of calcium supplement she should take.

## 2021-09-11 NOTE — Telephone Encounter (Signed)
Patient wants to know if it's okay for her to take a calcium supplement, since she takes diltiazem (CARDIZEM CD) 120 MG 24 hr capsule.

## 2021-09-16 DIAGNOSIS — I10 Essential (primary) hypertension: Secondary | ICD-10-CM | POA: Diagnosis not present

## 2021-09-16 DIAGNOSIS — E785 Hyperlipidemia, unspecified: Secondary | ICD-10-CM | POA: Diagnosis not present

## 2021-09-28 ENCOUNTER — Other Ambulatory Visit: Payer: Self-pay | Admitting: Cardiology

## 2021-09-30 NOTE — Telephone Encounter (Signed)
Prescription refill request for Eliquis received. Indication: PAF Last office visit: 08/22/21  Myles Gip MD Scr: 0.86 on 05/02/21 Age: 70 Weight: 85.3kg  Based on above findings Eliquis 5mg  twice daily is the appropriate dose.  Refill approved.

## 2021-10-17 DIAGNOSIS — M1712 Unilateral primary osteoarthritis, left knee: Secondary | ICD-10-CM | POA: Diagnosis not present

## 2021-11-22 DIAGNOSIS — M25552 Pain in left hip: Secondary | ICD-10-CM | POA: Diagnosis not present

## 2021-11-26 ENCOUNTER — Ambulatory Visit (HOSPITAL_COMMUNITY)
Admission: RE | Admit: 2021-11-26 | Discharge: 2021-11-26 | Disposition: A | Discharge status: Home / Self Care | Payer: Medicare Other | Source: Ambulatory Visit | Attending: Hematology | Admitting: Hematology

## 2021-11-26 ENCOUNTER — Other Ambulatory Visit (HOSPITAL_COMMUNITY): Payer: Self-pay | Admitting: Hematology

## 2021-11-26 ENCOUNTER — Other Ambulatory Visit: Payer: Self-pay

## 2021-11-26 DIAGNOSIS — C50911 Malignant neoplasm of unspecified site of right female breast: Secondary | ICD-10-CM

## 2021-11-26 DIAGNOSIS — Z170 Estrogen receptor positive status [ER+]: Secondary | ICD-10-CM

## 2021-11-26 DIAGNOSIS — Z853 Personal history of malignant neoplasm of breast: Secondary | ICD-10-CM | POA: Diagnosis not present

## 2021-11-26 DIAGNOSIS — Z1231 Encounter for screening mammogram for malignant neoplasm of breast: Secondary | ICD-10-CM | POA: Insufficient documentation

## 2021-12-11 ENCOUNTER — Encounter (HOSPITAL_COMMUNITY): Payer: Self-pay | Admitting: *Deleted

## 2021-12-24 DIAGNOSIS — J069 Acute upper respiratory infection, unspecified: Secondary | ICD-10-CM | POA: Diagnosis not present

## 2021-12-30 DIAGNOSIS — Z4431 Encounter for fitting and adjustment of external right breast prosthesis: Secondary | ICD-10-CM | POA: Diagnosis not present

## 2021-12-30 DIAGNOSIS — Z9011 Acquired absence of right breast and nipple: Secondary | ICD-10-CM | POA: Diagnosis not present

## 2021-12-30 DIAGNOSIS — C50111 Malignant neoplasm of central portion of right female breast: Secondary | ICD-10-CM | POA: Diagnosis not present

## 2022-01-08 DIAGNOSIS — Z961 Presence of intraocular lens: Secondary | ICD-10-CM | POA: Diagnosis not present

## 2022-01-08 DIAGNOSIS — H524 Presbyopia: Secondary | ICD-10-CM | POA: Diagnosis not present

## 2022-01-08 DIAGNOSIS — H5212 Myopia, left eye: Secondary | ICD-10-CM | POA: Diagnosis not present

## 2022-01-13 ENCOUNTER — Inpatient Hospital Stay (HOSPITAL_COMMUNITY): Payer: Medicare Other | Attending: Hematology | Admitting: Hematology

## 2022-01-13 ENCOUNTER — Other Ambulatory Visit: Payer: Self-pay

## 2022-01-13 VITALS — BP 147/68 | HR 67 | Temp 96.7°F | Resp 18 | Ht 63.5 in | Wt 181.8 lb

## 2022-01-13 DIAGNOSIS — M8580 Other specified disorders of bone density and structure, unspecified site: Secondary | ICD-10-CM | POA: Diagnosis not present

## 2022-01-13 DIAGNOSIS — Z923 Personal history of irradiation: Secondary | ICD-10-CM | POA: Insufficient documentation

## 2022-01-13 DIAGNOSIS — Z79899 Other long term (current) drug therapy: Secondary | ICD-10-CM | POA: Insufficient documentation

## 2022-01-13 DIAGNOSIS — C50911 Malignant neoplasm of unspecified site of right female breast: Secondary | ICD-10-CM | POA: Diagnosis not present

## 2022-01-13 DIAGNOSIS — Z170 Estrogen receptor positive status [ER+]: Secondary | ICD-10-CM | POA: Diagnosis not present

## 2022-01-13 NOTE — Patient Instructions (Addendum)
[name] at [facility] Discharge Instructions  You were seen and examined today by Dr. Delton Coombes. He reviewed your most recent labs, mammogram and bone density scan. Please keep follow up appointment as scheduled in 1 year.   Thank you for choosing [place] at [facility] to provide your oncology and hematology care.  To afford each patient quality time with our provider, please arrive at least 15 minutes before your scheduled appointment time.   If you have a lab appointment with the [facility] please come in thru the Main Entrance and check in at the main information desk.  You need to re-schedule your appointment should you arrive 10 or more minutes late.  We strive to give you quality time with our providers, and arriving late affects you and other patients whose appointments are after yours.  Also, if you no show three or more times for appointments you may be dismissed from the clinic at the providers discretion.     Again, thank you for choosing [facility].  Our hope is that these requests will decrease the amount of time that you wait before being seen by our physicians.       _____________________________________________________________  Should you have questions after your visit to [facility], please contact our office at [phone] and follow the prompts.  Our office hours are 8:00 a.m. and 4:30 p.m. Monday - Friday.  Please note that voicemails left after 4:00 p.m. may not be returned until the following business day.  We are closed weekends and major holidays.  You do have access to a nurse 24-7, just call the main number to the clinic [phone] and do not press any options, hold on the line and a nurse will answer the phone.    For prescription refill requests, have your pharmacy contact our office and allow 72 hours.    Due to Covid, you will need to wear a mask upon entering the hospital. If  you do not have a mask, a mask will be given to you at the Main Entrance upon arrival. For doctor visits, patients may have 1 support person age 75 or older with them. For treatment visits, patients can not have anyone with them due to social distancing guidelines and our immunocompromised population.

## 2022-01-13 NOTE — Progress Notes (Signed)
[facility] [address], [place] [address]   Patient Care Team: Celene Squibb, MD as PCP - General (Internal Medicine) Satira Sark, MD as PCP - Cardiology (Cardiology) Danie Binder, MD (Inactive) as Consulting Physician (Gastroenterology)  SUMMARY OF ONCOLOGIC HISTORY: Oncology History   No history exists.    CHIEF COMPLIANT: Follow-up of right breast cancer    INTERVAL HISTORY: [name] is a 71 y.o. female here today for follow up of her right breast cancer. Her last visit was on 07/17/2021.   Today she reports feeling good. She is taking 5000 units of vitamin D.   REVIEW OF SYSTEMS:   Review of Systems  Constitutional:  Negative for appetite change and fatigue.  Psychiatric/Behavioral:  Positive for sleep disturbance.   All other systems reviewed and are negative.  I have reviewed the past medical history, past surgical history, social history and family history with the patient and they are unchanged from previous note.   ALLERGIES:   is allergic to tape.   MEDICATIONS:  Current Outpatient Medications  Medication Sig Dispense Refill   ALPRAZolam (XANAX) 0.5 MG tablet Take 0.5 mg by mouth daily as needed for anxiety.      apixaban (ELIQUIS) 5 MG TABS tablet TAKE 1 TABLET BY MOUTH  TWICE DAILY 180 tablet 1   buPROPion (WELLBUTRIN XL) 300 MG 24 hr tablet Take 300 mg by mouth at bedtime.     Cholecalciferol (VITAMIN D3) 125 MCG (5000 UT) CAPS Take 1 capsule by mouth.     diltiazem (CARDIZEM CD) 120 MG 24 hr capsule TAKE 1 CAPSULE BY MOUTH  DAILY 90 capsule 3   ezetimibe (ZETIA) 10 MG tablet Take 10 mg by mouth daily.      fluticasone (FLONASE) 50 MCG/ACT nasal spray Place 1-2 sprays into both nostrils daily as needed for allergies or rhinitis.      levocetirizine (XYZAL) 5 MG tablet Take 5 mg by mouth in the morning.      omeprazole (PRILOSEC) 40 MG capsule Take 40 mg by mouth daily before breakfast.      No current  facility-administered medications for this visit.     PHYSICAL EXAMINATION: Performance status (ECOG): 1 - Symptomatic but completely ambulatory  There were no vitals filed for this visit. Wt Readings from Last 3 Encounters:  08/22/21 188 lb (85.3 kg)  07/17/21 188 lb 7.9 oz (85.5 kg)  04/22/21 198 lb 3.2 oz (89.9 kg)   Physical Exam Vitals reviewed.  Constitutional:      Appearance: Normal appearance. She is obese.  Cardiovascular:     Rate and Rhythm: Normal rate and regular rhythm.     Pulses: Normal pulses.     Heart sounds: Normal heart sounds.  Pulmonary:     Effort: Pulmonary effort is normal.     Breath sounds: Normal breath sounds.  Chest:  Breasts:    Right: No swelling, bleeding, inverted nipple, mass, nipple discharge, skin change (lumpectomy scar UIQ and OUQ WNL) or tenderness.     Left: Normal. No swelling, bleeding, inverted nipple, mass, nipple discharge, skin change or tenderness.  Abdominal:     Palpations: Abdomen is soft. There is no hepatomegaly, splenomegaly or mass.     Tenderness: There is no abdominal tenderness.  Lymphadenopathy:     Upper Body:     Right upper body: No supraclavicular, axillary or pectoral adenopathy.     Left upper body: No supraclavicular, axillary or pectoral adenopathy.  Neurological:     General: No focal deficit present.     Mental Status: She is alert and oriented to person, place, and time.  Psychiatric:        Mood and Affect: Mood normal.        Behavior: Behavior normal.    Breast Exam Chaperone: Thana Ates     LABORATORY DATA:  I have reviewed the data as listed CMP Latest Ref Rng & Units 09/04/2020 07/24/2020 01/26/2020  Glucose 70 - 99 mg/dL 91 83 94  BUN 8 - 23 mg/dL 13 11 15   Creatinine 0.44 - 1.00 mg/dL 0.98 0.87 0.84  Sodium 135 - 145 mmol/L 136 141 144  Potassium 3.5 - 5.1 mmol/L 3.6 4.4 4.6  Chloride 98 - 111 mmol/L 99 104 105  CO2 22 - 32 mmol/L 21(L) 28 22  Calcium 8.9 - 10.3 mg/dL 9.3 9.4 9.4   Total Protein 6.5 - 8.1 g/dL 7.1 - -  Total Bilirubin 0.3 - 1.2 mg/dL 1.1 - -  Alkaline Phos 38 - 126 U/L 72 - -  AST 15 - 41 U/L 32 - -  ALT 0 - 44 U/L 55(H) - -   No results found for: TFT732 Lab Results  Component Value Date   WBC 7.8 09/04/2020   HGB 14.6 09/04/2020   HCT 44.4 09/04/2020   MCV 94.5 09/04/2020   PLT 275 09/04/2020   NEUTROABS 5.6 09/04/2020    ASSESSMENT:  1.  Stage Ia right breast IDC: -Status post lumpectomy and SLNB on 04/30/2017 in [place].  Completed adjuvant radiation on 07/08/2017. - Anastrozole started around 06/03/2017, discontinued secondary to side effects after taking 6 months.  Femara was also discontinued secondary to dry vaginitis within a few weeks. -Right breast biopsy on 10/12/2018 shows scant benign adipose tissue with fibrosis with no malignancy. -Mammogram on 10/18/2019 was BI-RADS Category 2.   2.  Osteopenia: -Bone density on 08/21/2017 shows T score -1.1.   PLAN:  1.  Stage Ia right breast IDC: - Physical examination today did not reveal any palpable masses.  Lumpectomy scar in the right breast upper inner and upper outer quadrants are stable. - Reviewed mammogram from 11/26/2021 which was BI-RADS Category 1. - She brings labs from 05/02/2021 done at Dr. Juel Burrow office which was within normal limits. - At this time I will switch her to once a year visits as she will complete 5 years in June of this year.  We will also order screening mammogram prior to next visit.   2.  Osteopenia: - Continue vitamin D 5000 units daily. - We have reviewed bone density from 09/10/2021 with T score -1.5, osteopenia. - Recommend checking vitamin D levels at Dr. Juel Burrow office.  Breast Cancer therapy associated bone loss: I have recommended calcium, Vitamin D and weight bearing exercises.  Orders placed this encounter:  No orders of the defined types were placed in this encounter.   The patient has a good understanding of the overall plan. She agrees  with it. She will call with any problems that may develop before the next visit here.  Derek Jack, MD [facility] [phone]   I, Thana Ates, am acting as a scribe for Dr. Derek Jack.  I, Derek Jack MD, have reviewed the above documentation for accuracy and completeness, and I agree with the above.

## 2022-02-11 DIAGNOSIS — E559 Vitamin D deficiency, unspecified: Secondary | ICD-10-CM | POA: Diagnosis not present

## 2022-02-11 DIAGNOSIS — M8580 Other specified disorders of bone density and structure, unspecified site: Secondary | ICD-10-CM | POA: Diagnosis not present

## 2022-02-11 DIAGNOSIS — E782 Mixed hyperlipidemia: Secondary | ICD-10-CM | POA: Diagnosis not present

## 2022-02-11 DIAGNOSIS — R7301 Impaired fasting glucose: Secondary | ICD-10-CM | POA: Diagnosis not present

## 2022-02-18 DIAGNOSIS — K219 Gastro-esophageal reflux disease without esophagitis: Secondary | ICD-10-CM | POA: Diagnosis not present

## 2022-02-18 DIAGNOSIS — M25551 Pain in right hip: Secondary | ICD-10-CM | POA: Diagnosis not present

## 2022-02-18 DIAGNOSIS — M8580 Other specified disorders of bone density and structure, unspecified site: Secondary | ICD-10-CM | POA: Diagnosis not present

## 2022-02-18 DIAGNOSIS — I480 Paroxysmal atrial fibrillation: Secondary | ICD-10-CM | POA: Diagnosis not present

## 2022-02-18 DIAGNOSIS — Z853 Personal history of malignant neoplasm of breast: Secondary | ICD-10-CM | POA: Diagnosis not present

## 2022-02-18 DIAGNOSIS — K581 Irritable bowel syndrome with constipation: Secondary | ICD-10-CM | POA: Diagnosis not present

## 2022-02-18 DIAGNOSIS — E559 Vitamin D deficiency, unspecified: Secondary | ICD-10-CM | POA: Diagnosis not present

## 2022-02-18 DIAGNOSIS — E782 Mixed hyperlipidemia: Secondary | ICD-10-CM | POA: Diagnosis not present

## 2022-02-18 DIAGNOSIS — M179 Osteoarthritis of knee, unspecified: Secondary | ICD-10-CM | POA: Diagnosis not present

## 2022-02-18 DIAGNOSIS — E669 Obesity, unspecified: Secondary | ICD-10-CM | POA: Insufficient documentation

## 2022-02-18 DIAGNOSIS — G4700 Insomnia, unspecified: Secondary | ICD-10-CM | POA: Diagnosis not present

## 2022-03-06 ENCOUNTER — Encounter: Payer: Self-pay | Admitting: Cardiology

## 2022-03-06 ENCOUNTER — Telehealth: Payer: Self-pay | Admitting: *Deleted

## 2022-03-06 DIAGNOSIS — M1711 Unilateral primary osteoarthritis, right knee: Secondary | ICD-10-CM | POA: Diagnosis not present

## 2022-03-06 DIAGNOSIS — M170 Bilateral primary osteoarthritis of knee: Secondary | ICD-10-CM | POA: Diagnosis not present

## 2022-03-06 NOTE — Telephone Encounter (Signed)
To pharm then needs tele. Last OV 08/2021. ?

## 2022-03-06 NOTE — Telephone Encounter (Signed)
Please help obtaining formal request ?

## 2022-03-06 NOTE — Telephone Encounter (Signed)
? ?  Pre-operative Risk Assessment  ?  ?Patient Name: Kathy Evans  ?DOB: 1951-07-23 ?MRN: [id number]  ? ?  ? ?Request for Surgical Clearance   ? ?Procedure:   RIGHT TOTAL KNEE REPLACEMENT ? ?Date of Surgery:  Clearance TBD                              ?   ?Surgeon:  DR. DANIEL CAFFREY ?Surgeon's Group or Practice Name:  [facility] ?Phone number:  [phone] ATTN: [facility] EXT 1753 ?Fax number:  [phone] ?  ?Type of Clearance Requested:   ?- Medical  ?- Pharmacy:  Hold Apixaban (Eliquis)   ?  ?Type of Anesthesia:  General WITH NERVE BLOCK ?  ?Additional requests/questions:   ? ?Signed, ?Julaine Hua   ?03/06/2022, 2:18 PM  ? ?

## 2022-03-07 NOTE — Telephone Encounter (Signed)
Patient with diagnosis of aflutter on Eliquis for anticoagulation.   ? ?Procedure: right TKA ?Date of procedure: TBD ? ?CHA2DS2-VASc Score = 2  ?This indicates a 2.2% annual risk of stroke. ?The patient's score is based upon: ?CHF History: 0 ?HTN History: 0 ?Diabetes History: 0 ?Stroke History: 0 ?Vascular Disease History: 0 ?Age Score: 1 ?Gender Score: 1 ?  ?   ?CrCl 14m/min using adjusted body weight ?Platelet count 261K ? ?Per office protocol, patient can hold Eliquis for 3 days prior to procedure.   ?

## 2022-03-07 NOTE — Telephone Encounter (Signed)
Pharmacy has provided recommendations on holding Eliquis prior to right TKA. ? ?Preoperative team, please contact this patient and set up a phone call appointment for further cardiac evaluation.  Thank you for your help. ? ?Kathy Evans. Kathy Mccollom NP-C ? ?  ?03/07/2022, 8:33 AM ?[name] ?[name] ?Office [phone] Fax [phone] ? ?

## 2022-03-07 NOTE — Telephone Encounter (Signed)
Left message for the pt to call for tele pre op appt 

## 2022-03-11 ENCOUNTER — Telehealth: Payer: Self-pay | Admitting: *Deleted

## 2022-03-11 NOTE — Telephone Encounter (Signed)
Pt has been scheduled for a tele pre op appt 04/15/22 @ 11 am. Med rec and consent are done.  ?

## 2022-03-11 NOTE — Telephone Encounter (Signed)
Pt has been scheduled for a tele pre op appt 04/15/22 @ 11 am. Med rec and consent are done.  ? ?  ?Patient Consent for Virtual Visit  ? ? ?   ? ?Kathy Evans has provided verbal consent on 03/11/2022 for a virtual visit (video or telephone). ? ? ?CONSENT FOR VIRTUAL VISIT FOR:  Kathy Evans  ?By participating in this virtual visit I agree to the following: ? ?I hereby voluntarily request, consent and authorize [facility] and its employed or contracted physicians, physician assistants, nurse practitioners or other licensed health care professionals (the Practitioner), to provide me with telemedicine health care services (the ?Services") as deemed necessary by the treating Practitioner. I acknowledge and consent to receive the Services by the Practitioner via telemedicine. I understand that the telemedicine visit will involve communicating with the Practitioner through live audiovisual communication technology and the disclosure of certain medical information by electronic transmission. I acknowledge that I have been given the opportunity to request an in-person assessment or other available alternative prior to the telemedicine visit and am voluntarily participating in the telemedicine visit. ? ?I understand that I have the right to withhold or withdraw my consent to the use of telemedicine in the course of my care at any time, without affecting my right to future care or treatment, and that the Practitioner or I may terminate the telemedicine visit at any time. I understand that I have the right to inspect all information obtained and/or recorded in the course of the telemedicine visit and may receive copies of available information for a reasonable fee.  I understand that some of the potential risks of receiving the Services via telemedicine include:  ?Delay or interruption in medical evaluation due to technological equipment failure or disruption; ?Information transmitted may not be sufficient (e.g. poor  resolution of images) to allow for appropriate medical decision making by the Practitioner; and/or  ?In rare instances, security protocols could fail, causing a breach of personal health information. ? ?Furthermore, I acknowledge that it is my responsibility to provide information about my medical history, conditions and care that is complete and accurate to the best of my ability. I acknowledge that Practitioner's advice, recommendations, and/or decision may be based on factors not within their control, such as incomplete or inaccurate data provided by me or distortions of diagnostic images or specimens that may result from electronic transmissions. I understand that the practice of medicine is not an exact science and that Practitioner makes no warranties or guarantees regarding treatment outcomes. I acknowledge that a copy of this consent can be made available to me via my patient portal ([facility]), or I can request a printed copy by calling the office of [facility].   ? ?I understand that my insurance will be billed for this visit.  ? ?I have read or had this consent read to me. ?I understand the contents of this consent, which adequately explains the benefits and risks of the Services being provided via telemedicine.  ?I have been provided ample opportunity to ask questions regarding this consent and the Services and have had my questions answered to my satisfaction. ?I give my informed consent for the services to be provided through the use of telemedicine in my medical care ? ? ? ?

## 2022-03-11 NOTE — Telephone Encounter (Signed)
Patient is returning call.  °

## 2022-04-15 ENCOUNTER — Ambulatory Visit (INDEPENDENT_AMBULATORY_CARE_PROVIDER_SITE_OTHER): Payer: Medicare Other | Admitting: Nurse Practitioner

## 2022-04-15 DIAGNOSIS — Z01810 Encounter for preprocedural cardiovascular examination: Secondary | ICD-10-CM

## 2022-04-15 NOTE — Progress Notes (Signed)
Virtual Visit via Telephone Note   Because of Kathy Evans's co-morbid illnesses, she is at least at moderate risk for complications without adequate follow up.  This format is felt to be most appropriate for this patient at this time.  The patient did not have access to video technology/had technical difficulties with video requiring transitioning to audio format only (telephone).  All issues noted in this document were discussed and addressed.  No physical exam could be performed with this format.  Please refer to the patient's chart for her consent to telehealth for [facility]  Evaluation Performed:  Preoperative cardiovascular risk assessment _____________   Date:  04/15/2022   Patient ID:  Kathy Evans, DOB 29-Dec-1950, MRN [id number] Patient Location:  Home Provider location:   Office  Primary Care Provider:  Celene Squibb, MD Primary Cardiologist:  Rozann Lesches, MD  Chief Complaint / Patient Profile   71 y.o. y/o female with a h/o paroxysmal A-fib on Eliquis, HLD, GERD who is pending right total knee arthroplasty and presents today for telephonic preoperative cardiovascular risk assessment.  Past Medical History    Past Medical History:  Diagnosis Date   Arthritis    Atrial flutter [facility])    Diagnosed October 2021   Breast cancer ([place]) 03/2017   Right   Bronchitis    Depression    GERD (gastroesophageal reflux disease)    Hyperlipidemia    IBS (irritable bowel syndrome)    Impingement syndrome of left shoulder    Personal history of radiation therapy 2018   Past Surgical History:  Procedure Laterality Date   ADENOIDECTOMY     APPENDECTOMY     BREAST BIOPSY Left    benign   BREAST LUMPECTOMY Right 2018   BUNIONECTOMY Right    CARDIAC CATHETERIZATION     CATARACT EXTRACTION, BILATERAL     CHOLECYSTECTOMY     COLONOSCOPY WITH PROPOFOL N/A 02/02/2018   Dr. Oneida Alar: multiple small and large-mouthed diverticula in recto-sigmoid colon, sigmoid, and  descending. TI normal. Internal hemorrhoids during retroflexion, small. Moderate external hemorrhoids   Full mastectomy Right    MASTECTOMY, PARTIAL Right    PROLAPSED UTERINE FIBROID LIGATION     TONSILLECTOMY     TUBAL LIGATION     VAGINAL HYSTERECTOMY     VESICOVAGINAL FISTULA CLOSURE      Allergies  Allergies  Allergen Reactions   Statins     PT STATES SHE HAS BEEN MOST STATINS IN THE PAST AND HAS GREAT MUSCLE AND JOINT PAIN..    Tape Itching    Red and itchy    History of Present Illness    Kathy Evans is a 71 y.o. female who presents via audio/video conferencing for a telehealth visit today.  Pt was last seen in cardiology clinic on 08/2021 by Dr. Domenic Polite.  At that time Kathy Evans was doing well with exception of intentional tremors of the hands.  The patient is now pending procedure as outlined above. Since her last visit patient reports that she has been doing well with no cardiac complaints or bleeding issues with Eliquis.  She was previously exercising however since her knee problems she is limited with her physical activity. Home Medications    Prior to Admission medications   Medication Sig Start Date End Date Taking? Authorizing Provider  ALPRAZolam Duanne Moron) 0.5 MG tablet Take 0.5 mg by mouth daily as needed for anxiety.     [provider]  apixaban (ELIQUIS) 5 MG TABS tablet TAKE 1 TABLET  BY MOUTH  TWICE DAILY 09/30/21   Satira Sark, MD  buPROPion (WELLBUTRIN XL) 300 MG 24 hr tablet Take 300 mg by mouth at bedtime.    [provider]  Calcium Carb-Cholecalciferol (CALCIUM 600+D) 600-20 MG-MCG TABS Take 1 tablet by mouth daily.    [provider]  CHIA SEED PO Take 1 Scoop by mouth daily.    [provider]  Cholecalciferol (VITAMIN D3) 125 MCG (5000 UT) CAPS Take 1 capsule by mouth.    [provider]  Collagen-Vitamin C-Biotin (COLLAGEN 1500/C PO) Take 0.5 Scoops by mouth daily.    [provider]   diltiazem (CARDIZEM CD) 120 MG 24 hr capsule TAKE 1 CAPSULE BY MOUTH  DAILY 08/23/21   Satira Sark, MD  ezetimibe (ZETIA) 10 MG tablet Take 10 mg by mouth daily.  09/24/18   [provider]  fluticasone (FLONASE) 50 MCG/ACT nasal spray Place 1-2 sprays into both nostrils daily as needed for allergies or rhinitis.     [provider]  levocetirizine (XYZAL) 5 MG tablet Take 5 mg by mouth every evening. 03/04/18   [provider]  omeprazole (PRILOSEC) 40 MG capsule Take 40 mg by mouth daily before breakfast.     [provider]  rosuvastatin (CRESTOR) 5 MG tablet Take 1 tablet by mouth every Monday, Wednesday, and Friday. 02/19/22   [provider]  WHEY PROTEIN PO Take by mouth daily. Pt states she takes 1 tablespoon daily    [provider]  Whey Protein POWD by Does not apply route.    [provider]    Physical Exam    Vital Signs:  [name] does not have vital signs available for review today.  No vital signs available  Given telephonic nature of communication, physical exam is limited. AAOx3. NAD. Normal affect.  Speech and respirations are unlabored.  Accessory Clinical Findings    None  Assessment & Plan    1.  Preoperative Cardiovascular Risk Assessment:  Kathy Evans perioperative risk of a major cardiac event is 0.4% according to the Revised Cardiac Risk Index (RCRI).  Therefore, she is at low risk for perioperative complications.  Her functional capacity is good at 5.07 METs according to the Duke Activity Status Index (DASI).  Functional status primarily limited by knee pain and not cardiac issues or concerns.  The patient affirms she has been doing well without any new cardiac symptoms. They are able to achieve 5 METS without cardiac limitations. Therefore, based on ACC/AHA guidelines, the patient would be at acceptable risk for the planned procedure without further cardiovascular testing. The patient was  advised that if she develops new symptoms prior to surgery to contact our office to arrange for a follow-up visit, and she verbalized understanding.  Recommendations: According to ACC/AHA guidelines, no further cardiovascular testing needed.  The patient may proceed to surgery at acceptable risk.   Antiplatelet and/or Anticoagulation Recommendations: N/A Eliquis (Apixaban) can be held for 3 days prior to surgery.  Please resume post op when felt to be safe.      (Reminder: Include SBE prophylaxis/Antiplatelet/Anticoag Instructions:  Per office protocol, patient can hold Eliquis for 3 days prior to procedure.    A copy of this note will be routed to requesting surgeon.  Time:   Today, I have spent 6 minutes with the patient with telehealth technology discussing medical history, symptoms, and management plan.     Mable Fill, Marissa Nestle, NP  04/15/2022, 9:51 AM

## 2022-05-08 ENCOUNTER — Ambulatory Visit: Payer: Self-pay | Admitting: Physician Assistant

## 2022-05-08 DIAGNOSIS — M1711 Unilateral primary osteoarthritis, right knee: Secondary | ICD-10-CM | POA: Diagnosis not present

## 2022-05-08 DIAGNOSIS — G8929 Other chronic pain: Secondary | ICD-10-CM

## 2022-05-08 NOTE — H&P (Cosign Needed)
TOTAL KNEE ADMISSION H&P  Patient is being admitted for right total knee arthroplasty.  Subjective:  Chief Complaint:right knee pain.  HPI: Kathy Evans, 71 y.o. female, has a history of pain and functional disability in the right knee due to arthritis and has failed non-surgical conservative treatments for greater than 12 weeks to includeNSAID's and/or analgesics, corticosteriod injections, viscosupplementation injections, use of assistive devices, and activity modification.  Onset of symptoms was gradual, starting 3 years ago with gradually worsening course since that time. The patient noted no past surgery on the right knee(s).  Patient currently rates pain in the right knee(s) at 9 out of 10 with activity. Patient has night pain, worsening of pain with activity and weight bearing, pain that interferes with activities of daily living, pain with passive range of motion, crepitus, and joint swelling.  Patient has evidence of periarticular osteophytes and joint space narrowing by imaging studies. There is no active infection.  Patient Active Problem List   Diagnosis Date Noted   GERD (gastroesophageal reflux disease) 02/22/2018   Heme positive stool 11/26/2017   Irritable bowel syndrome with both constipation and diarrhea 09/25/2017   Left lower quadrant abdominal tenderness without rebound tenderness 09/17/2017   History of breast cancer 09/17/2017   Vaginal atrophy 09/17/2017   Vaginal discharge 09/17/2017   BV (bacterial vaginosis) 09/17/2017   Ductal carcinoma of breast, estrogen receptor positive, stage 1 ([place]) 08/18/2017   Past Medical History:  Diagnosis Date   Arthritis    Atrial flutter ([place])    Diagnosed October 2021   Breast cancer ([place]) 03/2017   Right   Bronchitis    Depression    GERD (gastroesophageal reflux disease)    Hyperlipidemia    IBS (irritable bowel syndrome)    Impingement syndrome of left shoulder    Personal history of radiation therapy 2018    Past  Surgical History:  Procedure Laterality Date   ADENOIDECTOMY     APPENDECTOMY     BREAST BIOPSY Left    benign   BREAST LUMPECTOMY Right 2018   BUNIONECTOMY Right    CARDIAC CATHETERIZATION     CATARACT EXTRACTION, BILATERAL     CHOLECYSTECTOMY     COLONOSCOPY WITH PROPOFOL N/A 02/02/2018   Dr. Oneida Alar: multiple small and large-mouthed diverticula in recto-sigmoid colon, sigmoid, and descending. TI normal. Internal hemorrhoids during retroflexion, small. Moderate external hemorrhoids   Full mastectomy Right    MASTECTOMY, PARTIAL Right    PROLAPSED UTERINE FIBROID LIGATION     TONSILLECTOMY     TUBAL LIGATION     VAGINAL HYSTERECTOMY     VESICOVAGINAL FISTULA CLOSURE      Current Outpatient Medications  Medication Sig Dispense Refill Last Dose   ALPRAZolam (XANAX) 0.5 MG tablet Take 0.5 mg by mouth daily as needed for anxiety.       apixaban (ELIQUIS) 5 MG TABS tablet TAKE 1 TABLET BY MOUTH  TWICE DAILY 180 tablet 1    buPROPion (WELLBUTRIN XL) 300 MG 24 hr tablet Take 300 mg by mouth at bedtime.      Calcium Carb-Cholecalciferol (CALCIUM 600+D) 600-20 MG-MCG TABS Take 1 tablet by mouth daily.      CHIA SEED PO Take 1 Scoop by mouth daily.      Cholecalciferol (VITAMIN D3) 125 MCG (5000 UT) CAPS Take 1 capsule by mouth.      Collagen-Vitamin C-Biotin (COLLAGEN 1500/C PO) Take 0.5 Scoops by mouth daily.      diltiazem (CARDIZEM CD) 120 MG 24 hr  capsule TAKE 1 CAPSULE BY MOUTH  DAILY 90 capsule 3    ezetimibe (ZETIA) 10 MG tablet Take 10 mg by mouth daily.       fluticasone (FLONASE) 50 MCG/ACT nasal spray Place 1-2 sprays into both nostrils daily as needed for allergies or rhinitis.       levocetirizine (XYZAL) 5 MG tablet Take 5 mg by mouth every evening.      omeprazole (PRILOSEC) 40 MG capsule Take 40 mg by mouth daily before breakfast.       rosuvastatin (CRESTOR) 5 MG tablet Take 1 tablet by mouth every Monday, Wednesday, and Friday.      WHEY PROTEIN PO Take by mouth daily. Pt  states she takes 1 tablespoon daily      Whey Protein POWD by Does not apply route.      No current facility-administered medications for this visit.   Allergies  Allergen Reactions   Statins     PT STATES SHE HAS BEEN MOST STATINS IN THE PAST AND HAS GREAT MUSCLE AND JOINT PAIN..    Tape Itching    Red and itchy    Social History   Tobacco Use   Smoking status: Former    Packs/day: 1.50    Years: 35.00    Total pack years: 52.50    Types: Cigarettes    Quit date: 2000    Years since quitting: 23.4   Smokeless tobacco: Never  Substance Use Topics   Alcohol use: Yes    Comment: very seldom    Family History  Problem Relation Age of Onset   Diabetes Maternal Grandmother    Alzheimer's disease Maternal Grandmother    Heart disease Father    Cancer Mother        uterine   Diabetes Mother    Heart disease Brother    Diabetes Brother    Cancer Sister        lung   Diabetes Sister    Heart disease Sister        had stent placed   Heart disease Brother    Cancer Sister        uterine   Diabetes Sister    High blood pressure Sister    Colon cancer Neg Hx      Review of Systems  Constitutional:  Positive for appetite change and unexpected weight change.  Respiratory:  Positive for shortness of breath.   Cardiovascular:  Positive for palpitations and leg swelling.  Gastrointestinal:  Positive for constipation, diarrhea, nausea and vomiting.  Genitourinary:  Positive for dysuria, hematuria and vaginal discharge.  Musculoskeletal:  Positive for arthralgias.  Neurological:  Positive for dizziness.  Hematological:  Bruises/bleeds easily.  All other systems reviewed and are negative.   Objective:  Physical Exam Constitutional:      General: She is not in acute distress.    Appearance: Normal appearance.  HENT:     Head: Normocephalic and atraumatic.  Eyes:     Extraocular Movements: Extraocular movements intact.     Pupils: Pupils are equal, round, and reactive  to light.  Cardiovascular:     Rate and Rhythm: Normal rate and regular rhythm.     Pulses: Normal pulses.     Heart sounds: Murmur heard.  Pulmonary:     Effort: Pulmonary effort is normal. No respiratory distress.     Breath sounds: Normal breath sounds. No wheezing.  Abdominal:     General: Abdomen is flat. Bowel sounds are normal. There is  no distension.     Palpations: Abdomen is soft.     Tenderness: There is no abdominal tenderness.  Musculoskeletal:     Cervical back: Normal range of motion and neck supple.     Right knee: Swelling and bony tenderness present. No effusion or erythema. Decreased range of motion. Tenderness present.  Lymphadenopathy:     Cervical: No cervical adenopathy.  Skin:    General: Skin is warm and dry.     Findings: No erythema or rash.  Neurological:     General: No focal deficit present.     Mental Status: She is alert and oriented to person, place, and time.  Psychiatric:        Mood and Affect: Mood normal.        Behavior: Behavior normal.     Vital signs in last 24 hours: '@VSRANGES'$ @  Labs:   Estimated body mass index is 31.7 kg/m as calculated from the following:   Height as of 01/13/22: 5' 3.5" (1.613 m).   Weight as of 01/13/22: 82.5 kg.   Imaging Review Plain radiographs demonstrate moderate degenerative joint disease of the right knee(s). The overall alignment issignificant valgus. The bone quality appears to be good for age and reported activity level.      Assessment/Plan:  End stage arthritis, right knee   The patient history, physical examination, clinical judgment of the provider and imaging studies are consistent with end stage degenerative joint disease of the right knee(s) and total knee arthroplasty is deemed medically necessary. The treatment options including medical management, injection therapy arthroscopy and arthroplasty were discussed at length. The risks and benefits of total knee arthroplasty were presented  and reviewed. The risks due to aseptic loosening, infection, stiffness, patella tracking problems, thromboembolic complications and other imponderables were discussed. The patient acknowledged the explanation, agreed to proceed with the plan and consent was signed. Patient is being admitted for inpatient treatment for surgery, pain control, PT, OT, prophylactic antibiotics, VTE prophylaxis, progressive ambulation and ADL's and discharge planning. The patient is planning to be discharged home with home health services    Anticipated LOS equal to or greater than 2 midnights due to - Age 40 and older with one or more of the following:  - Obesity  - Expected need for hospital services (PT, OT, Nursing) required for safe  discharge  - Anticipated need for postoperative skilled nursing care or inpatient rehab  - Active co-morbidities: Cardiac Arrhythmia OR   - Unanticipated findings during/Post Surgery: None  - Patient is a high risk of re-admission due to: None

## 2022-05-08 NOTE — H&P (View-Only) (Signed)
TOTAL KNEE ADMISSION H&P  Patient is being admitted for right total knee arthroplasty.  Subjective:  Chief Complaint:right knee pain.  HPI: Kathy Evans, 71 y.o. female, has a history of pain and functional disability in the right knee due to arthritis and has failed non-surgical conservative treatments for greater than 12 weeks to includeNSAID's and/or analgesics, corticosteriod injections, viscosupplementation injections, use of assistive devices, and activity modification.  Onset of symptoms was gradual, starting 3 years ago with gradually worsening course since that time. The patient noted no past surgery on the right knee(s).  Patient currently rates pain in the right knee(s) at 9 out of 10 with activity. Patient has night pain, worsening of pain with activity and weight bearing, pain that interferes with activities of daily living, pain with passive range of motion, crepitus, and joint swelling.  Patient has evidence of periarticular osteophytes and joint space narrowing by imaging studies. There is no active infection.  Patient Active Problem List   Diagnosis Date Noted   GERD (gastroesophageal reflux disease) 02/22/2018   Heme positive stool 11/26/2017   Irritable bowel syndrome with both constipation and diarrhea 09/25/2017   Left lower quadrant abdominal tenderness without rebound tenderness 09/17/2017   History of breast cancer 09/17/2017   Vaginal atrophy 09/17/2017   Vaginal discharge 09/17/2017   BV (bacterial vaginosis) 09/17/2017   Ductal carcinoma of breast, estrogen receptor positive, stage 1 ([place]) 08/18/2017   Past Medical History:  Diagnosis Date   Arthritis    Atrial flutter ([place])    Diagnosed October 2021   Breast cancer ([place]) 03/2017   Right   Bronchitis    Depression    GERD (gastroesophageal reflux disease)    Hyperlipidemia    IBS (irritable bowel syndrome)    Impingement syndrome of left shoulder    Personal history of radiation therapy 2018    Past  Surgical History:  Procedure Laterality Date   ADENOIDECTOMY     APPENDECTOMY     BREAST BIOPSY Left    benign   BREAST LUMPECTOMY Right 2018   BUNIONECTOMY Right    CARDIAC CATHETERIZATION     CATARACT EXTRACTION, BILATERAL     CHOLECYSTECTOMY     COLONOSCOPY WITH PROPOFOL N/A 02/02/2018   Dr. Oneida Alar: multiple small and large-mouthed diverticula in recto-sigmoid colon, sigmoid, and descending. TI normal. Internal hemorrhoids during retroflexion, small. Moderate external hemorrhoids   Full mastectomy Right    MASTECTOMY, PARTIAL Right    PROLAPSED UTERINE FIBROID LIGATION     TONSILLECTOMY     TUBAL LIGATION     VAGINAL HYSTERECTOMY     VESICOVAGINAL FISTULA CLOSURE      Current Outpatient Medications  Medication Sig Dispense Refill Last Dose   ALPRAZolam (XANAX) 0.5 MG tablet Take 0.5 mg by mouth daily as needed for anxiety.       apixaban (ELIQUIS) 5 MG TABS tablet TAKE 1 TABLET BY MOUTH  TWICE DAILY 180 tablet 1    buPROPion (WELLBUTRIN XL) 300 MG 24 hr tablet Take 300 mg by mouth at bedtime.      Calcium Carb-Cholecalciferol (CALCIUM 600+D) 600-20 MG-MCG TABS Take 1 tablet by mouth daily.      CHIA SEED PO Take 1 Scoop by mouth daily.      Cholecalciferol (VITAMIN D3) 125 MCG (5000 UT) CAPS Take 1 capsule by mouth.      Collagen-Vitamin C-Biotin (COLLAGEN 1500/C PO) Take 0.5 Scoops by mouth daily.      diltiazem (CARDIZEM CD) 120 MG 24 hr  capsule TAKE 1 CAPSULE BY MOUTH  DAILY 90 capsule 3    ezetimibe (ZETIA) 10 MG tablet Take 10 mg by mouth daily.       fluticasone (FLONASE) 50 MCG/ACT nasal spray Place 1-2 sprays into both nostrils daily as needed for allergies or rhinitis.       levocetirizine (XYZAL) 5 MG tablet Take 5 mg by mouth every evening.      omeprazole (PRILOSEC) 40 MG capsule Take 40 mg by mouth daily before breakfast.       rosuvastatin (CRESTOR) 5 MG tablet Take 1 tablet by mouth every Monday, Wednesday, and Friday.      WHEY PROTEIN PO Take by mouth daily. Pt  states she takes 1 tablespoon daily      Whey Protein POWD by Does not apply route.      No current facility-administered medications for this visit.   Allergies  Allergen Reactions   Statins     PT STATES SHE HAS BEEN MOST STATINS IN THE PAST AND HAS GREAT MUSCLE AND JOINT PAIN..    Tape Itching    Red and itchy    Social History   Tobacco Use   Smoking status: Former    Packs/day: 1.50    Years: 35.00    Total pack years: 52.50    Types: Cigarettes    Quit date: 2000    Years since quitting: 23.4   Smokeless tobacco: Never  Substance Use Topics   Alcohol use: Yes    Comment: very seldom    Family History  Problem Relation Age of Onset   Diabetes Maternal Grandmother    Alzheimer's disease Maternal Grandmother    Heart disease Father    Cancer Mother        uterine   Diabetes Mother    Heart disease Brother    Diabetes Brother    Cancer Sister        lung   Diabetes Sister    Heart disease Sister        had stent placed   Heart disease Brother    Cancer Sister        uterine   Diabetes Sister    High blood pressure Sister    Colon cancer Neg Hx      Review of Systems  Constitutional:  Positive for appetite change and unexpected weight change.  Respiratory:  Positive for shortness of breath.   Cardiovascular:  Positive for palpitations and leg swelling.  Gastrointestinal:  Positive for constipation, diarrhea, nausea and vomiting.  Genitourinary:  Positive for dysuria, hematuria and vaginal discharge.  Musculoskeletal:  Positive for arthralgias.  Neurological:  Positive for dizziness.  Hematological:  Bruises/bleeds easily.  All other systems reviewed and are negative.   Objective:  Physical Exam Constitutional:      General: She is not in acute distress.    Appearance: Normal appearance.  HENT:     Head: Normocephalic and atraumatic.  Eyes:     Extraocular Movements: Extraocular movements intact.     Pupils: Pupils are equal, round, and reactive  to light.  Cardiovascular:     Rate and Rhythm: Normal rate and regular rhythm.     Pulses: Normal pulses.     Heart sounds: Murmur heard.  Pulmonary:     Effort: Pulmonary effort is normal. No respiratory distress.     Breath sounds: Normal breath sounds. No wheezing.  Abdominal:     General: Abdomen is flat. Bowel sounds are normal. There is  no distension.     Palpations: Abdomen is soft.     Tenderness: There is no abdominal tenderness.  Musculoskeletal:     Cervical back: Normal range of motion and neck supple.     Right knee: Swelling and bony tenderness present. No effusion or erythema. Decreased range of motion. Tenderness present.  Lymphadenopathy:     Cervical: No cervical adenopathy.  Skin:    General: Skin is warm and dry.     Findings: No erythema or rash.  Neurological:     General: No focal deficit present.     Mental Status: She is alert and oriented to person, place, and time.  Psychiatric:        Mood and Affect: Mood normal.        Behavior: Behavior normal.     Vital signs in last 24 hours: '@VSRANGES'$ @  Labs:   Estimated body mass index is 31.7 kg/m as calculated from the following:   Height as of 01/13/22: 5' 3.5" (1.613 m).   Weight as of 01/13/22: 82.5 kg.   Imaging Review Plain radiographs demonstrate moderate degenerative joint disease of the right knee(s). The overall alignment issignificant valgus. The bone quality appears to be good for age and reported activity level.      Assessment/Plan:  End stage arthritis, right knee   The patient history, physical examination, clinical judgment of the provider and imaging studies are consistent with end stage degenerative joint disease of the right knee(s) and total knee arthroplasty is deemed medically necessary. The treatment options including medical management, injection therapy arthroscopy and arthroplasty were discussed at length. The risks and benefits of total knee arthroplasty were presented  and reviewed. The risks due to aseptic loosening, infection, stiffness, patella tracking problems, thromboembolic complications and other imponderables were discussed. The patient acknowledged the explanation, agreed to proceed with the plan and consent was signed. Patient is being admitted for inpatient treatment for surgery, pain control, PT, OT, prophylactic antibiotics, VTE prophylaxis, progressive ambulation and ADL's and discharge planning. The patient is planning to be discharged home with home health services    Anticipated LOS equal to or greater than 2 midnights due to - Age 96 and older with one or more of the following:  - Obesity  - Expected need for hospital services (PT, OT, Nursing) required for safe  discharge  - Anticipated need for postoperative skilled nursing care or inpatient rehab  - Active co-morbidities: Cardiac Arrhythmia OR   - Unanticipated findings during/Post Surgery: None  - Patient is a high risk of re-admission due to: None

## 2022-05-13 DIAGNOSIS — R059 Cough, unspecified: Secondary | ICD-10-CM | POA: Diagnosis not present

## 2022-05-13 DIAGNOSIS — J0100 Acute maxillary sinusitis, unspecified: Secondary | ICD-10-CM | POA: Insufficient documentation

## 2022-05-17 ENCOUNTER — Other Ambulatory Visit: Payer: Self-pay | Admitting: Cardiology

## 2022-05-19 NOTE — Patient Instructions (Signed)
DUE TO COVID-19 ONLY TWO VISITORS  (aged 73 and older)  ARE ALLOWED TO COME WITH YOU AND STAY IN THE WAITING ROOM ONLY DURING PRE OP AND PROCEDURE.   **NO VISITORS ARE ALLOWED IN THE SHORT STAY AREA OR RECOVERY ROOM!!**   Your procedure is scheduled on: 05/30/22   Report to [facility] Main Entrance    Report to admitting at 7:40 AM   Call this number if you have problems the morning of surgery [phone]   Do not eat food :After Midnight.   After Midnight you may have the following liquids until 7:10 AM DAY OF SURGERY  Water Black Coffee (sugar ok, NO MILK/CREAM OR CREAMERS)  Tea (sugar ok, NO MILK/CREAM OR CREAMERS) regular and decaf                             Plain Jell-O (NO RED)                                           Fruit ices (not with fruit pulp, NO RED)                                     Popsicles (NO RED)                                                                  Juice: apple, WHITE grape, WHITE cranberry Sports drinks like Gatorade (NO RED) Clear broth(vegetable,chicken,beef)     The day of surgery:  Drink ONE (1) Pre-Surgery Clear Ensure at 7:10 AM the morning of surgery. Drink in one sitting. Do not sip.  This drink was given to you during your hospital  pre-op appointment visit. Nothing else to drink after completing the  Pre-Surgery Clear Ensure.          If you have questions, please contact your surgeon's office.   FOLLOW BOWEL PREP AND ANY ADDITIONAL PRE OP INSTRUCTIONS YOU RECEIVED FROM YOUR SURGEON'S OFFICE!!!     Oral Hygiene is also important to reduce your risk of infection.                                    Remember - BRUSH YOUR TEETH THE MORNING OF SURGERY WITH YOUR REGULAR TOOTHPASTE   Take these medicines the morning of surgery with A SIP OF WATER: Xanax, Diltiazem, Omeprazole   These are anesthesia recommendations for holding your anticoagulants.  Please contact your prescribing physician to confirm IF it is safe to hold  your anticoagulants for this length of time.   Eliquis Apixaban   72 hours   Xarelto Rivaroxaban   72 hours  Plavix Clopidogrel   120 hours  Pletal Cilostazol   120 hours                                You may not have any metal on your body  including hair pins, jewelry, and body piercing             Do not wear make-up, lotions, powders, perfumes, or deodorant  Do not wear nail polish including gel and S&S, artificial/acrylic nails, or any other type of covering on natural nails including finger and toenails. If you have artificial nails, gel coating, etc. that needs to be removed by a nail salon please have this removed prior to surgery or surgery may need to be canceled/ delayed if the surgeon/ anesthesia feels like they are unable to be safely monitored.   Do not shave  48 hours prior to surgery.    Do not bring valuables to the hospital. [place].  DO NOT [place]. PHARMACY WILL DISPENSE MEDICATIONS LISTED ON YOUR MEDICATION LIST TO YOU DURING YOUR ADMISSION [place]!    Patients discharged on the day of surgery will not be allowed to drive home.  Someone NEEDS to stay with you for the first 24 hours after anesthesia.   Special Instructions: Bring a copy of your healthcare power of attorney and living will documents         the day of surgery if you haven't scanned them before.              Please read over the following fact sheets you were given: IF YOU HAVE QUESTIONS ABOUT YOUR PRE-OP INSTRUCTIONS PLEASE CALL [place] - Preparing for Surgery Before surgery, you can play an important role.  Because skin is not sterile, your skin needs to be as free of germs as possible.  You can reduce the number of germs on your skin by washing with CHG (chlorahexidine gluconate) soap before surgery.  CHG is an antiseptic cleaner which kills germs and bonds with the skin to  continue killing germs even after washing. Please DO NOT use if you have an allergy to CHG or antibacterial soaps.  If your skin becomes reddened/irritated stop using the CHG and inform your nurse when you arrive at Short Stay. Do not shave (including legs and underarms) for at least 48 hours prior to the first CHG shower.  You may shave your face/neck.  Please follow these instructions carefully:  1.  Shower with CHG Soap the night before surgery and the  morning of surgery.  2.  If you choose to wash your hair, wash your hair first as usual with your normal  shampoo.  3.  After you shampoo, rinse your hair and body thoroughly to remove the shampoo.                             4.  Use CHG as you would any other liquid soap.  You can apply chg directly to the skin and wash.  Gently with a scrungie or clean washcloth.  5.  Apply the CHG Soap to your body ONLY FROM THE NECK DOWN.   Do   not use on face/ open                           Wound or open sores. Avoid contact with eyes, ears mouth and   genitals (private parts).  Wash face,  Genitals (private parts) with your normal soap.             6.  Wash thoroughly, paying special attention to the area where your    surgery  will be performed.  7.  Thoroughly rinse your body with warm water from the neck down.  8.  DO NOT shower/wash with your normal soap after using and rinsing off the CHG Soap.                9.  Pat yourself dry with a clean towel.            10.  Wear clean pajamas.            11.  Place clean sheets on your bed the night of your first shower and do not  sleep with pets. Day of Surgery : Do not apply any lotions/deodorants the morning of surgery.  Please wear clean clothes to the hospital/surgery center.  FAILURE TO FOLLOW THESE INSTRUCTIONS MAY RESULT IN THE CANCELLATION OF YOUR SURGERY  PATIENT SIGNATURE_________________________________  NURSE  SIGNATURE__________________________________  ________________________________________________________________________   Kathy Evans  An incentive spirometer is a tool that can help keep your lungs clear and active. This tool measures how well you are filling your lungs with each breath. Taking long deep breaths may help reverse or decrease the chance of developing breathing (pulmonary) problems (especially infection) following: A long period of time when you are unable to move or be active. BEFORE THE PROCEDURE  If the spirometer includes an indicator to show your best effort, your nurse or respiratory therapist will set it to a desired goal. If possible, sit up straight or lean slightly forward. Try not to slouch. Hold the incentive spirometer in an upright position. INSTRUCTIONS FOR USE  Sit on the edge of your bed if possible, or sit up as far as you can in bed or on a chair. Hold the incentive spirometer in an upright position. Breathe out normally. Place the mouthpiece in your mouth and seal your lips tightly around it. Breathe in slowly and as deeply as possible, raising the piston or the ball toward the top of the column. Hold your breath for 3-5 seconds or for as long as possible. Allow the piston or ball to fall to the bottom of the column. Remove the mouthpiece from your mouth and breathe out normally. Rest for a few seconds and repeat Steps 1 through 7 at least 10 times every 1-2 hours when you are awake. Take your time and take a few normal breaths between deep breaths. The spirometer may include an indicator to show your best effort. Use the indicator as a goal to work toward during each repetition. After each set of 10 deep breaths, practice coughing to be sure your lungs are clear. If you have an incision (the cut made at the time of surgery), support your incision when coughing by placing a pillow or rolled up towels firmly against it. Once you are able to get out of  bed, walk around indoors and cough well. You may stop using the incentive spirometer when instructed by your caregiver.  RISKS AND COMPLICATIONS Take your time so you do not get dizzy or light-headed. If you are in pain, you may need to take or ask for pain medication before doing incentive spirometry. It is harder to take a deep breath if you are having pain. AFTER USE Rest and breathe slowly and easily. It can be helpful  to keep track of a log of your progress. Your caregiver can provide you with a simple table to help with this. If you are using the spirometer at home, follow these instructions: [place] IF:  You are having difficultly using the spirometer. You have trouble using the spirometer as often as instructed. Your pain medication is not giving enough relief while using the spirometer. You develop fever of 100.5 F (38.1 C) or higher. SEEK IMMEDIATE MEDICAL CARE IF:  You cough up bloody sputum that had not been present before. You develop fever of 102 F (38.9 C) or greater. You develop worsening pain at or near the incision site. MAKE SURE YOU:  Understand these instructions. Will watch your condition. Will get help right away if you are not doing well or get worse. Document Released: 03/16/2007 Document Revised: 01/26/2012 Document Reviewed: 05/17/2007 ExitCare Patient Information 2014 ExitCare, [place].   ________________________________________________________________________  WHAT IS A BLOOD TRANSFUSION? Blood Transfusion Information  A transfusion is the replacement of blood or some of its parts. Blood is made up of multiple cells which provide different functions. Red blood cells carry oxygen and are used for blood loss replacement. White blood cells fight against infection. Platelets control bleeding. Plasma helps clot blood. Other blood products are available for specialized needs, such as hemophilia or other clotting disorders. BEFORE THE TRANSFUSION   Who gives blood for transfusions?  Healthy volunteers who are fully evaluated to make sure their blood is safe. This is blood bank blood. Transfusion therapy is the safest it has ever been in the practice of medicine. Before blood is taken from a donor, a complete history is taken to make sure that person has no history of diseases nor engages in risky social behavior (examples are intravenous drug use or sexual activity with multiple partners). The donor's travel history is screened to minimize risk of transmitting infections, such as malaria. The donated blood is tested for signs of infectious diseases, such as HIV and hepatitis. The blood is then tested to be sure it is compatible with you in order to minimize the chance of a transfusion reaction. If you or a relative donates blood, this is often done in anticipation of surgery and is not appropriate for emergency situations. It takes many days to process the donated blood. RISKS AND COMPLICATIONS Although transfusion therapy is very safe and saves many lives, the main dangers of transfusion include:  Getting an infectious disease. Developing a transfusion reaction. This is an allergic reaction to something in the blood you were given. Every precaution is taken to prevent this. The decision to have a blood transfusion has been considered carefully by your caregiver before blood is given. Blood is not given unless the benefits outweigh the risks. AFTER THE TRANSFUSION Right after receiving a blood transfusion, you will usually feel much better and more energetic. This is especially true if your red blood cells have gotten low (anemic). The transfusion raises the level of the red blood cells which carry oxygen, and this usually causes an energy increase. The nurse administering the transfusion will monitor you carefully for complications. HOME CARE INSTRUCTIONS  No special instructions are needed after a transfusion. You may find your energy is  better. Speak with your caregiver about any limitations on activity for underlying diseases you may have. SEEK MEDICAL CARE IF:  Your condition is not improving after your transfusion. You develop redness or irritation at the intravenous (IV) site. SEEK IMMEDIATE MEDICAL CARE IF:  Any of  the following symptoms occur over the next 12 hours: Shaking chills. You have a temperature by mouth above 102 F (38.9 C), not controlled by medicine. Chest, back, or muscle pain. People around you feel you are not acting correctly or are confused. Shortness of breath or difficulty breathing. Dizziness and fainting. You get a rash or develop hives. You have a decrease in urine output. Your urine turns a dark color or changes to pink, red, or brown. Any of the following symptoms occur over the next 10 days: You have a temperature by mouth above 102 F (38.9 C), not controlled by medicine. Shortness of breath. Weakness after normal activity. The white part of the eye turns yellow (jaundice). You have a decrease in the amount of urine or are urinating less often. Your urine turns a dark color or changes to pink, red, or brown. Document Released: 10/31/2000 Document Revised: 01/26/2012 Document Reviewed: 06/19/2008 [facility] Patient Information 2014 [place], [place].  _______________________________________________________________________

## 2022-05-19 NOTE — Progress Notes (Addendum)
COVID Vaccine Completed: yes x3  Date of COVID positive in last 90 days: no  PCP - Nita Sells, MD Cardiologist - Nona Dell, MD  Cardiac clearance by Robin Searing 04/15/22 in Epic   Chest x-ray - n/a EKG - 05/21/22 Epic/ chart Stress Test - yes more than 5 years ago per pt ECHO - 09/13/20 Epic Cardiac Cath - yes 2013, clear per pt Pacemaker/ICD device last checked:n/a Spinal Cord Stimulator: n/a  Bowel Prep - no  Sleep Study - n/a CPAP -   Fasting Blood Sugar - n/a Checks Blood Sugar _____ times a day  Blood Thinner Instructions: Eliquis, hold 3 days before surgery Aspirin Instructions: Last Dose: 05/25/22 2000  Activity level: Can go up a flight of stairs and perform activities of daily living without stopping and without symptoms of chest pain or shortness of breath.    Anesthesia review: HTN, A fib, DOE  Patient denies shortness of breath, fever, cough and chest pain at PAT appointment  Patient verbalized understanding of instructions that were given to them at the PAT appointment. Patient was also instructed that they will need to review over the PAT instructions again at home before surgery.

## 2022-05-19 NOTE — Telephone Encounter (Signed)
Prescription refill request for Eliquis received. Indication: PAF Last office visit: 04/15/22  Elvin So NP Scr: 0.9 on 02/11/22 Age:  71 Weight: 82.5kg  Based on above findings Eliquis '5mg'$  twice daily is the appropriate dose.  Refill approved.

## 2022-05-21 ENCOUNTER — Encounter (HOSPITAL_COMMUNITY): Payer: Self-pay

## 2022-05-21 ENCOUNTER — Encounter (HOSPITAL_COMMUNITY)
Admission: RE | Admit: 2022-05-21 | Discharge: 2022-05-21 | Disposition: A | Discharge status: Home / Self Care | Payer: Medicare Other | Source: Ambulatory Visit | Attending: Orthopedic Surgery | Admitting: Orthopedic Surgery

## 2022-05-21 VITALS — BP 153/78 | HR 55 | Temp 98.1°F | Resp 12 | Ht 64.0 in | Wt 175.6 lb

## 2022-05-21 DIAGNOSIS — Z01818 Encounter for other preprocedural examination: Secondary | ICD-10-CM | POA: Insufficient documentation

## 2022-05-21 DIAGNOSIS — I2510 Atherosclerotic heart disease of native coronary artery without angina pectoris: Secondary | ICD-10-CM | POA: Insufficient documentation

## 2022-05-21 DIAGNOSIS — G8929 Other chronic pain: Secondary | ICD-10-CM | POA: Diagnosis not present

## 2022-05-21 DIAGNOSIS — M25561 Pain in right knee: Secondary | ICD-10-CM | POA: Insufficient documentation

## 2022-05-21 HISTORY — DX: Cardiac murmur, unspecified: R01.1

## 2022-05-21 LAB — COMPREHENSIVE METABOLIC PANEL
ALT: 16 U/L (ref 0–44)
AST: 17 U/L (ref 15–41)
Albumin: 3.9 g/dL (ref 3.5–5.0)
Alkaline Phosphatase: 69 U/L (ref 38–126)
Anion gap: 5 (ref 5–15)
BUN: 12 mg/dL (ref 8–23)
CO2: 28 mmol/L (ref 22–32)
Calcium: 9.3 mg/dL (ref 8.9–10.3)
Chloride: 111 mmol/L (ref 98–111)
Creatinine, Ser: 0.81 mg/dL (ref 0.44–1.00)
GFR, Estimated: >60 mL/min (ref >60)
Glucose, Bld: 97 mg/dL (ref 70–99)
Potassium: 4.4 mmol/L (ref 3.5–5.1)
Sodium: 144 mmol/L (ref 135–145)
Total Bilirubin: 0.5 mg/dL (ref 0.3–1.2)
Total Protein: 7 g/dL (ref 6.5–8.1)

## 2022-05-21 LAB — SURGICAL PCR SCREEN
MRSA, PCR: NEGATIVE
Staphylococcus aureus: NEGATIVE

## 2022-05-21 LAB — CBC WITH DIFFERENTIAL/PLATELET
Abs Immature Granulocytes: 0.03 10*3/uL (ref 0.00–0.07)
Basophils Absolute: 0.1 10*3/uL (ref 0.0–0.1)
Basophils Relative: 2 %
Eosinophils Absolute: 0.3 10*3/uL (ref 0.0–0.5)
Eosinophils Relative: 5 %
HCT: 43.8 % (ref 36.0–46.0)
Hemoglobin: 14.1 g/dL (ref 12.0–15.0)
Immature Granulocytes: 1 %
Lymphocytes Relative: 25 %
Lymphs Abs: 1.3 10*3/uL (ref 0.7–4.0)
MCH: 30.9 pg (ref 26.0–34.0)
MCHC: 32.2 g/dL (ref 30.0–36.0)
MCV: 96.1 fL (ref 80.0–100.0)
Monocytes Absolute: 0.5 10*3/uL (ref 0.1–1.0)
Monocytes Relative: 10 %
Neutro Abs: 3 10*3/uL (ref 1.7–7.7)
Neutrophils Relative %: 57 %
Platelets: 275 10*3/uL (ref 150–400)
RBC: 4.56 MIL/uL (ref 3.87–5.11)
RDW: 13.6 % (ref 11.5–15.5)
WBC: 5.2 10*3/uL (ref 4.0–10.5)
nRBC: 0 % (ref 0.0–0.2)

## 2022-05-26 NOTE — Care Plan (Signed)
Ortho Bundle Case Management Note  Patient Details  Name: Kathy Evans MRN: [id number] Date of Birth: [birth year]  Met with patient in the office prior to surgery. She will discharge to home with family to assist. Rolling walker and CPM ordered for home use. HHPT referral to [facility] and OPPT set up with [facility]. Patient and MD in agreement with plan. Choice offered                     DME Arranged:  CPM, Walker rolling DME Agency:     HH Arranged:  PT HH Agency:  [place]  Additional Comments: Please contact me with any questions of if this plan should need to change.  Ladell Heads,  [place] Specialist  [phone] 05/26/2022, 10:17 AM

## 2022-05-29 NOTE — Discharge Instructions (Addendum)

## 2022-05-30 ENCOUNTER — Ambulatory Visit (HOSPITAL_BASED_OUTPATIENT_CLINIC_OR_DEPARTMENT_OTHER): Payer: Medicare Other | Admitting: Anesthesiology

## 2022-05-30 ENCOUNTER — Other Ambulatory Visit: Payer: Self-pay

## 2022-05-30 ENCOUNTER — Ambulatory Visit (HOSPITAL_COMMUNITY): Payer: Medicare Other

## 2022-05-30 ENCOUNTER — Encounter (HOSPITAL_COMMUNITY): Payer: Self-pay | Admitting: Orthopedic Surgery

## 2022-05-30 ENCOUNTER — Encounter (HOSPITAL_COMMUNITY)
Admission: RE | Disposition: A | Discharge status: Home Health | Payer: Self-pay | Source: Home / Self Care | Attending: Orthopedic Surgery

## 2022-05-30 ENCOUNTER — Observation Stay (HOSPITAL_COMMUNITY)
Admission: RE | Admit: 2022-05-30 | Discharge: 2022-05-31 | Disposition: A | Discharge status: Home Health | Payer: Medicare Other | Attending: Orthopedic Surgery | Admitting: Orthopedic Surgery

## 2022-05-30 ENCOUNTER — Ambulatory Visit (HOSPITAL_COMMUNITY): Payer: Medicare Other | Admitting: Physician Assistant

## 2022-05-30 DIAGNOSIS — I4892 Unspecified atrial flutter: Secondary | ICD-10-CM | POA: Diagnosis not present

## 2022-05-30 DIAGNOSIS — Z853 Personal history of malignant neoplasm of breast: Secondary | ICD-10-CM | POA: Diagnosis not present

## 2022-05-30 DIAGNOSIS — Z7901 Long term (current) use of anticoagulants: Secondary | ICD-10-CM | POA: Insufficient documentation

## 2022-05-30 DIAGNOSIS — M1711 Unilateral primary osteoarthritis, right knee: Secondary | ICD-10-CM

## 2022-05-30 DIAGNOSIS — M21061 Valgus deformity, not elsewhere classified, right knee: Secondary | ICD-10-CM

## 2022-05-30 DIAGNOSIS — I4891 Unspecified atrial fibrillation: Secondary | ICD-10-CM

## 2022-05-30 DIAGNOSIS — Z96651 Presence of right artificial knee joint: Secondary | ICD-10-CM

## 2022-05-30 DIAGNOSIS — Z471 Aftercare following joint replacement surgery: Secondary | ICD-10-CM | POA: Diagnosis not present

## 2022-05-30 DIAGNOSIS — Z87891 Personal history of nicotine dependence: Secondary | ICD-10-CM | POA: Insufficient documentation

## 2022-05-30 DIAGNOSIS — G8918 Other acute postprocedural pain: Secondary | ICD-10-CM | POA: Diagnosis not present

## 2022-05-30 DIAGNOSIS — Z79899 Other long term (current) drug therapy: Secondary | ICD-10-CM | POA: Insufficient documentation

## 2022-05-30 HISTORY — PX: REPLACEMENT TOTAL KNEE: SUR1224

## 2022-05-30 HISTORY — PX: TOTAL KNEE ARTHROPLASTY: SHX125

## 2022-05-30 LAB — ABO/RH: ABO/RH(D): A POS

## 2022-05-30 LAB — TYPE AND SCREEN
ABO/RH(D): A POS
Antibody Screen: NEGATIVE

## 2022-05-30 SURGERY — ARTHROPLASTY, KNEE, TOTAL
Anesthesia: General | Site: Knee | Laterality: Right

## 2022-05-30 MED ORDER — PROPOFOL 10 MG/ML IV BOLUS
INTRAVENOUS | Status: DC | PRN
  Administered 2022-05-30: 150 mg via INTRAVENOUS

## 2022-05-30 MED ORDER — FENTANYL CITRATE (PF) 100 MCG/2ML IJ SOLN
INTRAMUSCULAR | Status: AC
  Filled 2022-05-30: qty 2

## 2022-05-30 MED ORDER — SODIUM CHLORIDE (PF) 0.9 % IJ SOLN
INTRAMUSCULAR | Status: AC
  Filled 2022-05-30: qty 50

## 2022-05-30 MED ORDER — METHOCARBAMOL 500 MG IVPB - SIMPLE MED
INTRAVENOUS | Status: AC
  Administered 2022-05-30: 500 mg
  Filled 2022-05-30: qty 55

## 2022-05-30 MED ORDER — DILTIAZEM HCL ER COATED BEADS 120 MG PO CP24
120.0000 mg | ORAL_CAPSULE | Freq: Every day | ORAL | Status: DC
Start: 2022-05-30 — End: 2022-05-31
  Administered 2022-05-30: 120 mg via ORAL
  Filled 2022-05-30: qty 1

## 2022-05-30 MED ORDER — TRANEXAMIC ACID 1000 MG/10ML IV SOLN
INTRAVENOUS | Status: DC | PRN
  Administered 2022-05-30: 2000 mg via TOPICAL

## 2022-05-30 MED ORDER — ORAL CARE MOUTH RINSE: 15.0000 mL | Freq: Once | OROMUCOSAL | Status: AC

## 2022-05-30 MED ORDER — SODIUM CHLORIDE 0.9 % IV SOLN: INTRAVENOUS | Status: DC

## 2022-05-30 MED ORDER — CEFAZOLIN SODIUM-DEXTROSE 1-4 GM/50ML-% IV SOLN
1.0000 g | Freq: Four times a day (QID) | INTRAVENOUS | Status: AC
  Administered 2022-05-30 – 2022-05-31 (×2): 1 g via INTRAVENOUS
  Filled 2022-05-30 (×2): qty 50

## 2022-05-30 MED ORDER — BUPIVACAINE-EPINEPHRINE (PF) 0.25% -1:200000 IJ SOLN
INTRAMUSCULAR | Status: DC | PRN
  Administered 2022-05-30: 30 mL

## 2022-05-30 MED ORDER — FLEET ENEMA 7-19 GM/118ML RE ENEM: 1.0000 | ENEMA | Freq: Once | RECTAL | Status: DC | PRN

## 2022-05-30 MED ORDER — TRANEXAMIC ACID-NACL 1000-0.7 MG/100ML-% IV SOLN
1000.0000 mg | Freq: Once | INTRAVENOUS | Status: AC
  Administered 2022-05-30: 1000 mg via INTRAVENOUS

## 2022-05-30 MED ORDER — PHENOL 1.4 % MT LIQD: 1.0000 | OROMUCOSAL | Status: DC | PRN

## 2022-05-30 MED ORDER — TRANEXAMIC ACID 1000 MG/10ML IV SOLN
2000.0000 mg | INTRAVENOUS | Status: DC
  Filled 2022-05-30: qty 20

## 2022-05-30 MED ORDER — ONDANSETRON HCL 4 MG PO TABS: 4.0000 mg | ORAL_TABLET | Freq: Three times a day (TID) | ORAL | 0 refills | Status: DC | PRN

## 2022-05-30 MED ORDER — FENTANYL CITRATE PF 50 MCG/ML IJ SOSY
50.0000 ug | PREFILLED_SYRINGE | INTRAMUSCULAR | Status: DC
  Administered 2022-05-30: 50 ug via INTRAVENOUS
  Filled 2022-05-30: qty 2

## 2022-05-30 MED ORDER — DEXAMETHASONE SODIUM PHOSPHATE 10 MG/ML IJ SOLN
INTRAMUSCULAR | Status: DC | PRN
  Administered 2022-05-30: 8 mg via INTRAVENOUS

## 2022-05-30 MED ORDER — PROPOFOL 1000 MG/100ML IV EMUL
INTRAVENOUS | Status: AC
  Filled 2022-05-30: qty 100

## 2022-05-30 MED ORDER — POLYETHYLENE GLYCOL 3350 17 G PO PACK: 17.0000 g | PACK | Freq: Every day | ORAL | Status: DC | PRN

## 2022-05-30 MED ORDER — METOCLOPRAMIDE HCL 5 MG PO TABS: 5.0000 mg | ORAL_TABLET | Freq: Three times a day (TID) | ORAL | Status: DC | PRN

## 2022-05-30 MED ORDER — LACTATED RINGERS IV BOLUS
500.0000 mL | Freq: Once | INTRAVENOUS | Status: AC
  Administered 2022-05-30: 500 mL via INTRAVENOUS

## 2022-05-30 MED ORDER — MENTHOL 3 MG MT LOZG: 1.0000 | LOZENGE | OROMUCOSAL | Status: DC | PRN

## 2022-05-30 MED ORDER — ACETAMINOPHEN 500 MG PO TABS: 1000.0000 mg | ORAL_TABLET | Freq: Four times a day (QID) | ORAL | 2 refills | Status: AC | PRN

## 2022-05-30 MED ORDER — ACETAMINOPHEN 500 MG PO TABS
1000.0000 mg | ORAL_TABLET | Freq: Four times a day (QID) | ORAL | Status: AC
  Administered 2022-05-30 – 2022-05-31 (×4): 1000 mg via ORAL
  Filled 2022-05-30 (×4): qty 2

## 2022-05-30 MED ORDER — TRANEXAMIC ACID-NACL 1000-0.7 MG/100ML-% IV SOLN
INTRAVENOUS | Status: AC
  Filled 2022-05-30: qty 100

## 2022-05-30 MED ORDER — DOCUSATE SODIUM 100 MG PO CAPS
100.0000 mg | ORAL_CAPSULE | Freq: Two times a day (BID) | ORAL | Status: DC
  Administered 2022-05-30 – 2022-05-31 (×2): 100 mg via ORAL
  Filled 2022-05-30 (×2): qty 1

## 2022-05-30 MED ORDER — APIXABAN 5 MG PO TABS
5.0000 mg | ORAL_TABLET | Freq: Every day | ORAL | Status: DC
  Administered 2022-05-31: 5 mg via ORAL
  Filled 2022-05-30: qty 1

## 2022-05-30 MED ORDER — OXYCODONE HCL 5 MG PO TABS: ORAL_TABLET | ORAL | 0 refills | Status: DC

## 2022-05-30 MED ORDER — ACETAMINOPHEN 325 MG PO TABS: 325.0000 mg | ORAL_TABLET | Freq: Four times a day (QID) | ORAL | Status: DC | PRN

## 2022-05-30 MED ORDER — DIPHENHYDRAMINE HCL 12.5 MG/5ML PO ELIX: 12.5000 mg | ORAL_SOLUTION | ORAL | Status: DC | PRN

## 2022-05-30 MED ORDER — LIDOCAINE HCL (CARDIAC) PF 100 MG/5ML IV SOSY
PREFILLED_SYRINGE | INTRAVENOUS | Status: DC | PRN
  Administered 2022-05-30: 100 mg via INTRAVENOUS

## 2022-05-30 MED ORDER — BUPIVACAINE LIPOSOME 1.3 % IJ SUSP: 20.0000 mL | Freq: Once | INTRAMUSCULAR | Status: DC

## 2022-05-30 MED ORDER — METOCLOPRAMIDE HCL 5 MG/ML IJ SOLN
INTRAMUSCULAR | Status: AC
  Filled 2022-05-30: qty 2

## 2022-05-30 MED ORDER — CEFAZOLIN SODIUM-DEXTROSE 2-4 GM/100ML-% IV SOLN
2.0000 g | INTRAVENOUS | Status: AC
  Administered 2022-05-30: 2 g via INTRAVENOUS
  Filled 2022-05-30: qty 100

## 2022-05-30 MED ORDER — BUPROPION HCL ER (XL) 300 MG PO TB24
300.0000 mg | ORAL_TABLET | Freq: Every day | ORAL | Status: DC
  Administered 2022-05-30: 300 mg via ORAL
  Filled 2022-05-30: qty 1

## 2022-05-30 MED ORDER — ONDANSETRON HCL 4 MG/2ML IJ SOLN
INTRAMUSCULAR | Status: AC
  Filled 2022-05-30: qty 2

## 2022-05-30 MED ORDER — BUPIVACAINE LIPOSOME 1.3 % IJ SUSP
INTRAMUSCULAR | Status: DC | PRN
  Administered 2022-05-30: 20 mL

## 2022-05-30 MED ORDER — ROCURONIUM BROMIDE 100 MG/10ML IV SOLN
INTRAVENOUS | Status: DC | PRN
  Administered 2022-05-30: 50 mg via INTRAVENOUS
  Administered 2022-05-30: 10 mg via INTRAVENOUS

## 2022-05-30 MED ORDER — WATER FOR IRRIGATION, STERILE IR SOLN
Status: DC | PRN
  Administered 2022-05-30: 2000 mL

## 2022-05-30 MED ORDER — MIDAZOLAM HCL 2 MG/2ML IJ SOLN
1.0000 mg | INTRAMUSCULAR | Status: DC
  Administered 2022-05-30 (×2): 2 mg via INTRAVENOUS
  Filled 2022-05-30: qty 2

## 2022-05-30 MED ORDER — METHOCARBAMOL 500 MG PO TABS
500.0000 mg | ORAL_TABLET | Freq: Four times a day (QID) | ORAL | Status: DC | PRN
  Administered 2022-05-31 (×3): 500 mg via ORAL
  Filled 2022-05-30 (×3): qty 1

## 2022-05-30 MED ORDER — SUGAMMADEX SODIUM 200 MG/2ML IV SOLN
INTRAVENOUS | Status: DC | PRN
  Administered 2022-05-30: 200 mg via INTRAVENOUS

## 2022-05-30 MED ORDER — ALPRAZOLAM 0.5 MG PO TABS: 0.5000 mg | ORAL_TABLET | Freq: Every day | ORAL | Status: DC | PRN

## 2022-05-30 MED ORDER — PANTOPRAZOLE SODIUM 40 MG PO TBEC
40.0000 mg | DELAYED_RELEASE_TABLET | Freq: Every day | ORAL | Status: DC
  Administered 2022-05-31: 40 mg via ORAL
  Filled 2022-05-30 (×2): qty 1

## 2022-05-30 MED ORDER — 0.9 % SODIUM CHLORIDE (POUR BTL) OPTIME
TOPICAL | Status: DC | PRN
  Administered 2022-05-30: 1000 mL

## 2022-05-30 MED ORDER — OXYCODONE HCL 5 MG PO TABS
5.0000 mg | ORAL_TABLET | ORAL | Status: DC | PRN
  Administered 2022-05-30 – 2022-05-31 (×3): 5 mg via ORAL
  Filled 2022-05-30 (×3): qty 1

## 2022-05-30 MED ORDER — ONDANSETRON HCL 4 MG PO TABS: 4.0000 mg | ORAL_TABLET | Freq: Four times a day (QID) | ORAL | Status: DC | PRN

## 2022-05-30 MED ORDER — BUPIVACAINE LIPOSOME 1.3 % IJ SUSP
INTRAMUSCULAR | Status: AC
  Filled 2022-05-30: qty 20

## 2022-05-30 MED ORDER — ONDANSETRON HCL 4 MG/2ML IJ SOLN
4.0000 mg | Freq: Four times a day (QID) | INTRAMUSCULAR | Status: DC | PRN
  Administered 2022-05-30: 4 mg via INTRAVENOUS

## 2022-05-30 MED ORDER — DEXAMETHASONE SODIUM PHOSPHATE 10 MG/ML IJ SOLN
INTRAMUSCULAR | Status: AC
  Filled 2022-05-30: qty 1

## 2022-05-30 MED ORDER — LACTATED RINGERS IV BOLUS
250.0000 mL | Freq: Once | INTRAVENOUS | Status: AC
  Administered 2022-05-30: 250 mL via INTRAVENOUS

## 2022-05-30 MED ORDER — LACTATED RINGERS IV SOLN: INTRAVENOUS | Status: DC

## 2022-05-30 MED ORDER — HYDROMORPHONE HCL 1 MG/ML IJ SOLN
INTRAMUSCULAR | Status: AC
  Administered 2022-05-30: 0.5 mg via INTRAVENOUS
  Filled 2022-05-30: qty 2

## 2022-05-30 MED ORDER — FENTANYL CITRATE (PF) 100 MCG/2ML IJ SOLN
INTRAMUSCULAR | Status: DC | PRN
  Administered 2022-05-30 (×2): 100 ug via INTRAVENOUS

## 2022-05-30 MED ORDER — METOCLOPRAMIDE HCL 5 MG/ML IJ SOLN
5.0000 mg | Freq: Three times a day (TID) | INTRAMUSCULAR | Status: DC | PRN
  Administered 2022-05-30: 10 mg via INTRAVENOUS

## 2022-05-30 MED ORDER — MIDAZOLAM HCL 2 MG/2ML IJ SOLN
INTRAMUSCULAR | Status: AC
  Filled 2022-05-30: qty 2

## 2022-05-30 MED ORDER — POVIDONE-IODINE 10 % EX SWAB
2.0000 | Freq: Once | CUTANEOUS | Status: AC
  Administered 2022-05-30: 2 via TOPICAL

## 2022-05-30 MED ORDER — SORBITOL 70 % SOLN
30.0000 mL | Freq: Every day | Status: DC | PRN
Start: 2022-05-30 — End: 2022-05-31

## 2022-05-30 MED ORDER — APIXABAN 5 MG PO TABS: 5.0000 mg | ORAL_TABLET | Freq: Two times a day (BID) | ORAL | 1 refills | Status: DC

## 2022-05-30 MED ORDER — SODIUM CHLORIDE 0.9 % IR SOLN
Status: DC | PRN
  Administered 2022-05-30: 1000 mL

## 2022-05-30 MED ORDER — BUPIVACAINE-EPINEPHRINE (PF) 0.25% -1:200000 IJ SOLN
INTRAMUSCULAR | Status: AC
  Filled 2022-05-30: qty 30

## 2022-05-30 MED ORDER — ACETAMINOPHEN 500 MG PO TABS
1000.0000 mg | ORAL_TABLET | Freq: Once | ORAL | Status: AC
  Administered 2022-05-30: 1000 mg via ORAL
  Filled 2022-05-30: qty 2

## 2022-05-30 MED ORDER — HYDROMORPHONE HCL 1 MG/ML IJ SOLN: 0.5000 mg | INTRAMUSCULAR | Status: DC | PRN

## 2022-05-30 MED ORDER — BUPIVACAINE-EPINEPHRINE (PF) 0.5% -1:200000 IJ SOLN
INTRAMUSCULAR | Status: DC | PRN
  Administered 2022-05-30: 20 mL via PERINEURAL

## 2022-05-30 MED ORDER — SUGAMMADEX SODIUM 200 MG/2ML IV SOLN: INTRAVENOUS | Status: DC | PRN

## 2022-05-30 MED ORDER — SODIUM CHLORIDE 0.9% FLUSH
INTRAVENOUS | Status: DC | PRN
  Administered 2022-05-30: 50 mL

## 2022-05-30 MED ORDER — HYDROMORPHONE HCL 1 MG/ML IJ SOLN
0.2500 mg | INTRAMUSCULAR | Status: DC | PRN
  Administered 2022-05-30 (×2): 0.5 mg via INTRAVENOUS

## 2022-05-30 MED ORDER — TRANEXAMIC ACID-NACL 1000-0.7 MG/100ML-% IV SOLN
1000.0000 mg | INTRAVENOUS | Status: AC
  Administered 2022-05-30: 1000 mg via INTRAVENOUS
  Filled 2022-05-30: qty 100

## 2022-05-30 MED ORDER — CHLORHEXIDINE GLUCONATE 0.12 % MT SOLN
15.0000 mL | Freq: Once | OROMUCOSAL | Status: AC
  Administered 2022-05-30: 15 mL via OROMUCOSAL

## 2022-05-30 SURGICAL SUPPLY — 56 items
ATTUNE PS FEM RT SZ 5 CEM KNEE (Femur) ×1 IMPLANT
ATTUNE PSRP INSE SZ5 7 KNEE (Insert) ×1 IMPLANT
BAG COUNTER SPONGE SURGICOUNT (BAG) ×2 IMPLANT
BAG DECANTER FOR FLEXI CONT (MISCELLANEOUS) ×2 IMPLANT
BAG ZIPLOCK 12X15 (MISCELLANEOUS) ×2 IMPLANT
BASEPLATE TIBIAL ROTATING SZ 4 (Knees) ×1 IMPLANT
BLADE SAGITTAL 25.0X1.19X90 (BLADE) ×2 IMPLANT
BLADE SAW SGTL 13X75X1.27 (BLADE) ×2 IMPLANT
BLADE SURG 15 STRL LF DISP TIS (BLADE) ×1 IMPLANT
BLADE SURG 15 STRL SS (BLADE) ×1
BLADE SURG SZ10 CARB STEEL (BLADE) ×4 IMPLANT
BNDG ELASTIC 6X15 VLCR STRL LF (GAUZE/BANDAGES/DRESSINGS) ×2 IMPLANT
BOWL SMART MIX CTS (DISPOSABLE) ×2 IMPLANT
CEMENT HV SMART SET (Cement) ×2 IMPLANT
CLSR STERI-STRIP ANTIMIC 1/2X4 (GAUZE/BANDAGES/DRESSINGS) ×3 IMPLANT
COVER SURGICAL LIGHT HANDLE (MISCELLANEOUS) ×2 IMPLANT
CUFF TOURN SGL QUICK 34 (TOURNIQUET CUFF) ×1
CUFF TRNQT CYL 34X4.125X (TOURNIQUET CUFF) ×1 IMPLANT
DRAPE INCISE IOBAN 66X45 STRL (DRAPES) ×2 IMPLANT
DRAPE U-SHAPE 47X51 STRL (DRAPES) ×2 IMPLANT
DRESSING AQUACEL AG SP 3.5X10 (GAUZE/BANDAGES/DRESSINGS) ×1 IMPLANT
DRSG AQUACEL AG SP 3.5X10 (GAUZE/BANDAGES/DRESSINGS) ×2
DURAPREP 26ML APPLICATOR (WOUND CARE) ×4 IMPLANT
ELECT REM PT RETURN 15FT ADLT (MISCELLANEOUS) ×2 IMPLANT
GAUZE PAD ABD 8X10 STRL (GAUZE/BANDAGES/DRESSINGS) ×1 IMPLANT
GLOVE BIOGEL PI IND STRL 8 (GLOVE) ×2 IMPLANT
GLOVE BIOGEL PI INDICATOR 8 (GLOVE) ×2
GLOVE SURG ORTHO 8.0 STRL STRW (GLOVE) ×2 IMPLANT
GLOVE SURG POLYISO LF SZ7.5 (GLOVE) ×2 IMPLANT
GOWN STRL REUS W/ TWL XL LVL3 (GOWN DISPOSABLE) ×2 IMPLANT
GOWN STRL REUS W/TWL XL LVL3 (GOWN DISPOSABLE) ×2
HANDPIECE INTERPULSE COAX TIP (DISPOSABLE) ×1
HOLDER FOLEY CATH W/STRAP (MISCELLANEOUS) IMPLANT
HOOD PEEL AWAY FLYTE STAYCOOL (MISCELLANEOUS) ×2 IMPLANT
IMMOBILIZER KNEE 20 (SOFTGOODS) ×2
IMMOBILIZER KNEE 20 THIGH 36 (SOFTGOODS) ×1 IMPLANT
KIT TURNOVER KIT A (KITS) IMPLANT
MANIFOLD NEPTUNE II (INSTRUMENTS) ×2 IMPLANT
NEEDLE HYPO 22GX1.5 SAFETY (NEEDLE) ×4 IMPLANT
NS IRRIG 1000ML POUR BTL (IV SOLUTION) ×2 IMPLANT
PACK TOTAL KNEE CUSTOM (KITS) ×2 IMPLANT
PATELLA MEDIAL ATTUN 35MM KNEE (Knees) ×1 IMPLANT
PROTECTOR NERVE ULNAR (MISCELLANEOUS) ×2 IMPLANT
SET HNDPC FAN SPRY TIP SCT (DISPOSABLE) ×1 IMPLANT
SPIKE FLUID TRANSFER (MISCELLANEOUS) ×4 IMPLANT
SUT ETHIBOND NAB CT1 #1 30IN (SUTURE) ×4 IMPLANT
SUT MNCRL AB 3-0 PS2 18 (SUTURE) ×2 IMPLANT
SUT VIC AB 0 CT1 36 (SUTURE) ×2 IMPLANT
SUT VIC AB 2-0 CT1 27 (SUTURE) ×3
SUT VIC AB 2-0 CT1 TAPERPNT 27 (SUTURE) ×2 IMPLANT
SYR CONTROL 10ML LL (SYRINGE) ×6 IMPLANT
TOWEL OR 17X26 10 PK STRL BLUE (TOWEL DISPOSABLE) ×2 IMPLANT
TRAY FOLEY MTR SLVR 16FR STAT (SET/KITS/TRAYS/PACK) ×1 IMPLANT
TUBE SUCTION HIGH CAP CLEAR NV (SUCTIONS) ×2 IMPLANT
WATER STERILE IRR 1000ML POUR (IV SOLUTION) ×4 IMPLANT
WRAP KNEE MAXI GEL POST OP (GAUZE/BANDAGES/DRESSINGS) ×2 IMPLANT

## 2022-05-30 NOTE — Progress Notes (Signed)
Patient so far unable to work with therapy due to somnolence and multiple bouts of nausea/vomiting. Will plan to keep overnight in observation with plans to work with PT tomorrow and hopefully discharge home tomorrow afternoon if able to appropriately progress.   Merlene Pulling, PA-C

## 2022-05-30 NOTE — Anesthesia Preprocedure Evaluation (Addendum)
Anesthesia Evaluation  Patient identified by MRN, date of birth, ID band Patient awake    Reviewed: Allergy & Precautions, NPO status , Patient's Chart, lab work & pertinent test results  Airway Mallampati: II  TM Distance: >3 FB     Dental   Pulmonary former smoker,    breath sounds clear to auscultation       Cardiovascular + dysrhythmias Atrial Fibrillation (-) Cardiac Defibrillator (-) Valvular Problems/Murmurs Rhythm:Regular Rate:Normal  Hx noted Dr. Nyoka Cowden   Neuro/Psych    GI/Hepatic Neg liver ROS, GERD  ,  Endo/Other  negative endocrine ROS  Renal/GU negative Renal ROS     Musculoskeletal  (+) Arthritis ,   Abdominal   Peds  Hematology   Anesthesia Other Findings   Reproductive/Obstetrics                            Anesthesia Physical Anesthesia Plan  ASA: 3  Anesthesia Plan: General   Post-op Pain Management: Regional block*   Induction: Intravenous  PONV Risk Score and Plan: 3 and Ondansetron, Dexamethasone and Midazolam  Airway Management Planned: Oral ETT  Additional Equipment:   Intra-op Plan:   Post-operative Plan: Extubation in OR  Informed Consent: I have reviewed the patients History and Physical, chart, labs and discussed the procedure including the risks, benefits and alternatives for the proposed anesthesia with the patient or authorized representative who has indicated his/her understanding and acceptance.     Dental advisory given  Plan Discussed with: Anesthesiologist and CRNA  Anesthesia Plan Comments:      Anesthesia Quick Evaluation

## 2022-05-30 NOTE — Anesthesia Postprocedure Evaluation (Signed)
Anesthesia Post Note  Patient: Kathy Evans  Procedure(s) Performed: TOTAL KNEE ARTHROPLASTY (Right: Knee)     Patient location during evaluation: PACU Anesthesia Type: General Level of consciousness: awake Pain management: pain level controlled Vital Signs Assessment: post-procedure vital signs reviewed and stable Respiratory status: spontaneous breathing Cardiovascular status: stable Postop Assessment: no apparent nausea or vomiting Anesthetic complications: no   No notable events documented.  Last Vitals:  Vitals:   05/30/22 1330 05/30/22 1345  BP: (!) 162/91 (!) 169/88  Pulse: (!) 57 (!) 59  Resp: 11 14  Temp:    SpO2: 98% 96%    Last Pain:  Vitals:   05/30/22 1345  TempSrc:   PainSc: 2                  Shahla Betsill

## 2022-05-30 NOTE — Anesthesia Procedure Notes (Signed)
Procedure Name: Intubation Date/Time: 05/30/2022 10:28 AM  Performed by: Nikolos Billig, Forest Gleason, CRNAPre-anesthesia Checklist: Patient identified, Emergency Drugs available, Suction available, Patient being monitored and Timeout performed Patient Re-evaluated:Patient Re-evaluated prior to induction Oxygen Delivery Method: Circle system utilized Preoxygenation: Pre-oxygenation with 100% oxygen Induction Type: IV induction Ventilation: Mask ventilation without difficulty Laryngoscope Size: Mac and 4 Grade View: Grade I Tube type: Oral Number of attempts: 1 Airway Equipment and Method: Stylet Placement Confirmation: ETT inserted through vocal cords under direct vision, positive ETCO2, CO2 detector and breath sounds checked- equal and bilateral Secured at: 21 cm Tube secured with: Tape Dental Injury: Teeth and Oropharynx as per pre-operative assessment

## 2022-05-30 NOTE — Progress Notes (Signed)
Orthopedic Tech Progress Note Patient Details:  Kathy Evans 06/16/1951 [id number]  Ortho Devices Type of Ortho Device: Bone foam zero knee Ortho Device/Splint Interventions: Ordered      Kathy Evans 05/30/2022, 1:56 PM

## 2022-05-30 NOTE — Op Note (Signed)
NAMEJUAQUINA, Kathy Evans MEDICAL RECORD NO: [id number] ACCOUNT NO: [id number] DATE OF BIRTH: [birth year] FACILITY: [facility] LOCATION: [facility] PHYSICIAN: W D. Valeta Harms., MD  Operative Report   DATE OF PROCEDURE: 05/30/2022  PREOPERATIVE DIAGNOSES:  Severe osteoarthritis, right knee with valgus deformity.  POSTOPERATIVE DIAGNOSES:  Severe osteoarthritis, right knee with valgus deformity.  PROCEDURE:  Right total knee replacement (Attune cemented knee), size 5 femur, size 4 tibia, size 5 tibial bearing with a 7 mm poly thickness and 35 mm all poly patella.  SURGEON:  W D. Valeta Harms., MD  ASSISTANT:  Beau Fanny.  TOURNIQUET TIME:  Approximately 1 hour 5 minutes.  ANESTHESIA:  General with block.  DESCRIPTION OF PROCEDURE:  Straight skin incision with medial parapatellar approach to the knee, made.  We did a provisional 10, but had to do an 11 mm cut on the femur due to bone deficiency on the lateral femoral condyle.  We then cut about 3 mm below  the most diseased lateral compartment with the extension gap, eventually it would be measured at 7 mm.  We placed all-in-1 cutting block in the appropriate degree of external rotation, accomplishing the anterior, posterior and chamfer cuts.  Small  osteophytes were removed from the posterior aspect of the knee as well as complete release of the PCL.  The subcutaneous and capsular tissues were infiltrated with a mixture of Marcaine and Exparel with additional topical TXA.  Tibia was sized to be a  size 4 with the placement of the keel cut on the tibia.  The flexion gap did equal the extension gap at 7 mm.  Box cut was made on the femur followed by resecting 7.5 mm of patella.  Placement of the 35 mm trial.  We trialed different poly thicknesses  settling on 7 mm thickness.  Cement was prepared on the back table and inserted in the doughy state tibia followed by femur and patella.  Cement was allowed to harden.  Excess cement was removed from the posterior  aspect of the knee.  Tourniquet was  released under direct vision.  No excessive bleeding was noted.  Final bearing was placed.  Resolution was noted.  The valgus deformity with full extension with good balance medially and laterally.  Pulsatile lavage was used throughout the case.  Closure  was affected with #1 Ethibond, 2-0 Vicryl and Monocryl in the skin.   PUS D: 05/30/2022 12:19:36 pm T: 05/30/2022 2:02:00 pm  JOB: [id number]/ [id number]

## 2022-05-30 NOTE — Transfer of Care (Signed)
Immediate Anesthesia Transfer of Care Note  Patient: Kathy Evans  Procedure(s) Performed: TOTAL KNEE ARTHROPLASTY (Right: Knee)  Patient Location: PACU  Anesthesia Type:General  Level of Consciousness: awake  Airway & Oxygen Therapy: Patient Spontanous Breathing  Post-op Assessment: Report given to RN  Post vital signs: stable  Last Vitals:  Vitals Value Taken Time  BP 157/86 05/30/22 1251  Temp 36.6 C 05/30/22 1250  Pulse 64 05/30/22 1255  Resp 15 05/30/22 1255  SpO2 100 % 05/30/22 1255  Vitals shown include unvalidated device data.  Last Pain:  Vitals:   05/30/22 0814  TempSrc:   PainSc: 0-No pain         Complications: No notable events documented.

## 2022-05-30 NOTE — Progress Notes (Signed)
PT Cancellation Note  Patient Details Name: Kathy Evans MRN: [id number] DOB: 10/21/1951   Cancelled Treatment:    Reason Eval/Treat Not Completed: (P) Fatigue/lethargy limiting ability to participate;Medical issues which prohibited therapy (Pt nauseated, vomting, and groggy after general anesthesia, unable to initiate PT eval. Will follow up as schedule and pt status allows which may be tomorrow.)  Coolidge Breeze, PT, DPT [facility] Office: [phone] Pager: [phone] Coolidge Breeze 05/30/2022, 6:07 PM

## 2022-05-30 NOTE — Interval H&P Note (Signed)
History and Physical Interval Note:  05/30/2022 7:28 AM  Kathy Evans  has presented today for surgery, with the diagnosis of OA RIGHT KNEE.  The various methods of treatment have been discussed with the patient and family. After consideration of risks, benefits and other options for treatment, the patient has consented to  Procedure(s): TOTAL KNEE ARTHROPLASTY (Right) as a surgical intervention.  The patient's history has been reviewed, patient examined, no change in status, stable for surgery.  I have reviewed the patient's chart and labs.  Questions were answered to the patient's satisfaction.     Yvette Rack

## 2022-05-30 NOTE — Plan of Care (Signed)
  Problem: Education: Goal: Knowledge of General Education information will improve Description: Including pain rating scale, medication(s)/side effects and non-pharmacologic comfort measures 05/30/2022 1747 by Anda Kraft, RN Outcome: Progressing 05/30/2022 1747 by Anda Kraft, RN Outcome: Progressing

## 2022-05-30 NOTE — Anesthesia Procedure Notes (Addendum)
Anesthesia Regional Block: Adductor canal block   Pre-Anesthetic Checklist: , timeout performed,  Correct Patient, Correct Site, Correct Laterality,  Correct Procedure, Correct Position, site marked,  Risks and benefits discussed,  Surgical consent,  Pre-op evaluation,  At surgeon's request and post-op pain management  Laterality: Right  Prep: chloraprep       Needles:  Injection technique: Single-shot  Needle Type: Stimulator Needle - 80          Additional Needles:   Procedures: Doppler guided,,,, ultrasound used (permanent image in chart),,    Narrative:  Start time: 05/30/2022 9:05 AM End time: 05/30/2022 9:20 AM Injection made incrementally with aspirations every 5 mL.  Performed by: Other  Anesthesiologist: Belinda Block, MD

## 2022-05-31 DIAGNOSIS — Z87891 Personal history of nicotine dependence: Secondary | ICD-10-CM | POA: Diagnosis not present

## 2022-05-31 DIAGNOSIS — Z7901 Long term (current) use of anticoagulants: Secondary | ICD-10-CM | POA: Diagnosis not present

## 2022-05-31 DIAGNOSIS — Z853 Personal history of malignant neoplasm of breast: Secondary | ICD-10-CM | POA: Diagnosis not present

## 2022-05-31 DIAGNOSIS — M1711 Unilateral primary osteoarthritis, right knee: Secondary | ICD-10-CM | POA: Diagnosis not present

## 2022-05-31 DIAGNOSIS — Z79899 Other long term (current) drug therapy: Secondary | ICD-10-CM | POA: Diagnosis not present

## 2022-05-31 DIAGNOSIS — I4892 Unspecified atrial flutter: Secondary | ICD-10-CM | POA: Diagnosis not present

## 2022-05-31 DIAGNOSIS — Z96651 Presence of right artificial knee joint: Secondary | ICD-10-CM | POA: Diagnosis not present

## 2022-05-31 LAB — CBC
HCT: 37.4 % (ref 36.0–46.0)
Hemoglobin: 11.9 g/dL — ABNORMAL LOW (ref 12.0–15.0)
MCH: 31.1 pg (ref 26.0–34.0)
MCHC: 31.8 g/dL (ref 30.0–36.0)
MCV: 97.7 fL (ref 80.0–100.0)
Platelets: 163 10*3/uL (ref 150–400)
RBC: 3.83 MIL/uL — ABNORMAL LOW (ref 3.87–5.11)
RDW: 13.4 % (ref 11.5–15.5)
WBC: 11.3 10*3/uL — ABNORMAL HIGH (ref 4.0–10.5)
nRBC: 0 % (ref 0.0–0.2)

## 2022-05-31 LAB — BASIC METABOLIC PANEL
Anion gap: 9 (ref 5–15)
BUN: 11 mg/dL (ref 8–23)
CO2: 23 mmol/L (ref 22–32)
Calcium: 8.7 mg/dL — ABNORMAL LOW (ref 8.9–10.3)
Chloride: 109 mmol/L (ref 98–111)
Creatinine, Ser: 0.64 mg/dL (ref 0.44–1.00)
GFR, Estimated: >60 mL/min (ref >60)
Glucose, Bld: 123 mg/dL — ABNORMAL HIGH (ref 70–99)
Potassium: 4 mmol/L (ref 3.5–5.1)
Sodium: 141 mmol/L (ref 135–145)

## 2022-05-31 NOTE — Evaluation (Signed)
Physical Therapy Evaluation Patient Details Name: Kathy Evans MRN: [id number] DOB: 12-05-1950 Today's Date: 05/31/2022  History of Present Illness  Pt is a 71yo female presenting s/p R-TKA on 05/30/22. PMH: atrial flutter, hx of breast cancer, GERD, HLD, IBS.  Clinical Impression  Pt s/p R TKR and presents with decreased R LE strength/ROM, post op pain and limited endurance limiting functional mobility.  Pt should progress to dc home with family assist and reports HHPT to initiate tomorrow.     Recommendations for follow up therapy are one component of a multi-disciplinary discharge planning process, led by the attending physician.  Recommendations may be updated based on patient status, additional functional criteria and insurance authorization.  Follow Up Recommendations Follow physician's recommendations for discharge plan and follow up therapies      Assistance Recommended at Discharge Intermittent Supervision/Assistance  Patient can return home with the following  A little help with walking and/or transfers;A little help with bathing/dressing/bathroom;Assistance with cooking/housework;Help with stairs or ramp for entrance;Assist for transportation    Equipment Recommendations None recommended by PT  Recommendations for Other Services       Functional Status Assessment Patient has had a recent decline in their functional status and demonstrates the ability to make significant improvements in function in a reasonable and predictable amount of time.     Precautions / Restrictions Precautions Precautions: Fall;Knee Required Braces or Orthoses: Knee Immobilizer - Right Knee Immobilizer - Right: Discontinue once straight leg raise with < 10 degree lag Restrictions Weight Bearing Restrictions: No Other Position/Activity Restrictions: WBAT      Mobility  Bed Mobility   Bed Mobility: Supine to Sit     Supine to sit: Min guard     General bed mobility comments: min quard  for R LE only    Transfers Overall transfer level: Needs assistance Equipment used: Rolling walker (2 wheels) Transfers: Sit to/from Stand Sit to Stand: Min assist           General transfer comment: cues for LE management and use of UEs to self assist    Ambulation/Gait Ambulation/Gait assistance: Min assist Gait Distance (Feet): 21 Feet Assistive device: Rolling walker (2 wheels) Gait Pattern/deviations: Step-to pattern, Decreased step length - right, Decreased step length - left, Shuffle, Trunk flexed Gait velocity: decr     General Gait Details: cues for sequence, posture and position from RW; distance ltd by fatigue  Stairs            Wheelchair Mobility    Modified Rankin (Stroke Patients Only)       Balance Overall balance assessment: Mild deficits observed, not formally tested                                           Pertinent Vitals/Pain Pain Assessment Pain Assessment: 0-10 Pain Score: 4  Pain Location: L knee Pain Descriptors / Indicators: Aching, Sore Pain Intervention(s): Limited activity within patient's tolerance, Monitored during session, Premedicated before session, Ice applied    Home Living Family/patient expects to be discharged to:: Private residence Living Arrangements: Spouse/significant other Available Help at Discharge: Family Type of Home: House Home Access: Stairs to enter Entrance Stairs-Rails: Right Entrance Stairs-Number of Steps: 5   Home Layout: One level Home Equipment: Conservation officer, nature (2 wheels);Cane - single point      Prior Function Prior Level of Function : Independent/Modified Independent  Mobility Comments: Using cane as needed       Hand Dominance        Extremity/Trunk Assessment   Upper Extremity Assessment Upper Extremity Assessment: Overall WFL for tasks assessed    Lower Extremity Assessment Lower Extremity Assessment: RLE deficits/detail RLE Deficits /  Details: 3-/5 quads with AAROM at knee -5 - 45    Cervical / Trunk Assessment Cervical / Trunk Assessment: Normal  Communication   Communication: No difficulties  Cognition Arousal/Alertness: Awake/alert Behavior During Therapy: WFL for tasks assessed/performed Overall Cognitive Status: Within Functional Limits for tasks assessed                                          General Comments      Exercises Total Joint Exercises Ankle Circles/Pumps: AROM, Both, 15 reps, Supine Quad Sets: AROM, Both, 10 reps, Supine Heel Slides: AAROM, Right, 15 reps, Supine Hip ABduction/ADduction: AAROM, AROM, 10 reps, Supine   Assessment/Plan    PT Assessment Patient needs continued PT services  PT Problem List Decreased strength;Decreased range of motion;Decreased activity tolerance;Decreased balance;Decreased mobility;Decreased knowledge of use of DME;Pain       PT Treatment Interventions DME instruction;Gait training;Stair training;Functional mobility training;Therapeutic activities;Therapeutic exercise;Patient/family education    PT Goals (Current goals can be found in the Care Plan section)  Acute Rehab PT Goals Patient Stated Goal: Regain IND PT Goal Formulation: With patient Time For Goal Achievement: 06/07/22 Potential to Achieve Goals: Good    Frequency 7X/week     Co-evaluation               AM-PAC PT "6 Clicks" Mobility  Outcome Measure Help needed turning from your back to your side while in a flat bed without using bedrails?: A Little Help needed moving from lying on your back to sitting on the side of a flat bed without using bedrails?: A Little Help needed moving to and from a bed to a chair (including a wheelchair)?: A Little Help needed standing up from a chair using your arms (e.g., wheelchair or bedside chair)?: A Little Help needed to walk in hospital room?: A Little Help needed climbing 3-5 steps with a railing? : A Little 6 Click Score:  18    End of Session Equipment Utilized During Treatment: Gait belt Activity Tolerance: Patient limited by fatigue Patient left: in chair;with call bell/phone within reach;with chair alarm set Nurse Communication: Mobility status PT Visit Diagnosis: Difficulty in walking, not elsewhere classified (R26.2)    Time: 2426-8341 PT Time Calculation (min) (ACUTE ONLY): 28 min   Charges:   PT Evaluation $PT Eval Low Complexity: 1 Low PT Treatments $Therapeutic Exercise: 8-22 mins        Debe Coder PT Acute Rehabilitation Services Pager [phone] Office [phone]   Jaasia Viglione 05/31/2022, 1:45 PM

## 2022-05-31 NOTE — TOC Transition Note (Signed)
Transition of Care [facility]) - CM/SW Discharge Note   Patient Details  Name: Kathy Evans MRN: [id number] Date of Birth: [birth year]  [facility] of [facility] CM/SW Contact:  Ross Ludwig, LCSW Phone Number: 05/31/2022, 7:03 PM   Clinical Narrative:     Patient will be discharging back home with home health through [facility].  Patient's home health was prearranged at physician's office.  [employer] already delivered the youth rolling walker.  CSW signing off, please reconsult if other social work needs arise.   Final next level of care: [place] Barriers to Discharge: Barriers Resolved   Patient Goals and CMS Choice Patient states their goals for this hospitalization and ongoing recovery are:: To return back home with home health. CMS Medicare.gov Compare Post Acute Care list provided to:: Patient Choice offered to / list presented to : Patient  Discharge Placement                       Discharge Plan and Services                DME Arranged: [facility] youth DME Agency: [employer]       HH Arranged: PT [place] Agency: [place]        Social Determinants of Health (SDOH) Interventions     Readmission Risk Interventions     No data to display

## 2022-05-31 NOTE — Progress Notes (Signed)
Physical Therapy Treatment Patient Details Name: Kathy Evans MRN: [id number] DOB: [birth year] Today's Date: 05/31/2022   History of Present Illness Pt is a 71yo female presenting s/p R-TKA on 05/30/22. PMH: atrial flutter, hx of breast cancer, GERD, HLD, IBS.    PT Comments    Marked progress with mobility including ambulating increased distance in hall, negotiating stairs and decreased level of assist on most tasks.  Pt eager to dc home this date.   Recommendations for follow up therapy are one component of a multi-disciplinary discharge planning process, led by the attending physician.  Recommendations may be updated based on patient status, additional functional criteria and insurance authorization.  Follow Up Recommendations  Follow physician's recommendations for discharge plan and follow up therapies     Assistance Recommended at Discharge Intermittent Supervision/Assistance  Patient can return home with the following A little help with walking and/or transfers;A little help with bathing/dressing/bathroom;Assistance with cooking/housework;Help with stairs or ramp for entrance;Assist for transportation   Equipment Recommendations  None recommended by PT    Recommendations for Other Services       Precautions / Restrictions Precautions Precautions: Fall;Knee Required Braces or Orthoses: Knee Immobilizer - Right Knee Immobilizer - Right: Discontinue once straight leg raise with < 10 degree lag Restrictions Weight Bearing Restrictions: No Other Position/Activity Restrictions: WBAT     Mobility  Bed Mobility   Bed Mobility: Supine to Sit     Supine to sit: Min guard     General bed mobility comments: Pt up in chair and requests back to same    Transfers Overall transfer level: Needs assistance Equipment used: Rolling walker (2 wheels) Transfers: Sit to/from Stand Sit to Stand: Min guard, Supervision           General transfer comment: cues for LE management  and use of UEs to self assist    Ambulation/Gait Ambulation/Gait assistance: Min guard Gait Distance (Feet): 75 Feet Assistive device: Rolling walker (2 wheels) Gait Pattern/deviations: Step-to pattern, Decreased step length - right, Decreased step length - left, Shuffle, Trunk flexed Gait velocity: decr     General Gait Details: cues for sequence, posture and position from RW;   Stairs Stairs: Yes Stairs assistance: Min assist Stair Management: No rails, One rail Right, Step to pattern, Forwards, Backwards, With walker, With cane Number of Stairs: 3 General stair comments: 2 step with rail and cane, single step bkwd with RW; cues for sequence; written instruction provided   Wheelchair Mobility    Modified Rankin (Stroke Patients Only)       Balance Overall balance assessment: Mild deficits observed, not formally tested                                          Cognition Arousal/Alertness: Awake/alert Behavior During Therapy: WFL for tasks assessed/performed Overall Cognitive Status: Within Functional Limits for tasks assessed                                          Exercises Total Joint Exercises Ankle Circles/Pumps: AROM, Both, 15 reps, Supine Quad Sets: AROM, Both, 10 reps, Supine Heel Slides: AAROM, Right, 15 reps, Supine Hip ABduction/ADduction: AAROM, AROM, 10 reps, Supine    General Comments        Pertinent Vitals/Pain Pain Assessment Pain Assessment: 0-10  Pain Score: 4  Pain Location: L knee Pain Descriptors / Indicators: Aching, Sore Pain Intervention(s): Limited activity within patient's tolerance, Monitored during session, Premedicated before session, Ice applied    Home Living Family/patient expects to be discharged to:: Private residence Living Arrangements: Spouse/significant other Available Help at Discharge: Family Type of Home: House Home Access: Stairs to enter Entrance Stairs-Rails: Right Entrance  Stairs-Number of Steps: 5   Home Layout: One level Home Equipment: Conservation officer, nature (2 wheels);Cane - single point      Prior Function            PT Goals (current goals can now be found in the care plan section) Acute Rehab PT Goals Patient Stated Goal: Regain IND PT Goal Formulation: With patient Time For Goal Achievement: 06/07/22 Potential to Achieve Goals: Good Progress towards PT goals: Progressing toward goals    Frequency    7X/week      PT Plan Current plan remains appropriate    Co-evaluation              AM-PAC PT "6 Clicks" Mobility   Outcome Measure  Help needed turning from your back to your side while in a flat bed without using bedrails?: A Little Help needed moving from lying on your back to sitting on the side of a flat bed without using bedrails?: A Little Help needed moving to and from a bed to a chair (including a wheelchair)?: A Little Help needed standing up from a chair using your arms (e.g., wheelchair or bedside chair)?: A Little Help needed to walk in hospital room?: A Little Help needed climbing 3-5 steps with a railing? : A Little 6 Click Score: 18    End of Session Equipment Utilized During Treatment: Gait belt Activity Tolerance: Patient tolerated treatment well Patient left: in chair;with call bell/phone within reach;with family/visitor present Nurse Communication: Mobility status PT Visit Diagnosis: Difficulty in walking, not elsewhere classified (R26.2)     Time: 1155-2080 PT Time Calculation (min) (ACUTE ONLY): 36 min  Charges:  $Gait Training: 8-22 mins $Therapeutic Exercise: 8-22 mins $Therapeutic Activity: 8-22 mins                     Debe Coder PT Acute Rehabilitation Services Pager [phone] Office [phone]    Kathy Evans 05/31/2022, 1:53 PM

## 2022-05-31 NOTE — Progress Notes (Signed)
The patient is alert and oriented and has been seen by her physician. The orders for discharge were written. IV has been removed. Went over discharge instructions with patient and family. Ace wrap removed. Aquacel dressing clean, dry, and intact. She is being discharged via wheelchair with all of her belongings.

## 2022-05-31 NOTE — Plan of Care (Signed)
  Problem: Education: Goal: Knowledge of General Education information will improve Description: Including pain rating scale, medication(s)/side effects and non-pharmacologic comfort measures Outcome: Progressing   Problem: Health Behavior/Discharge Planning: Goal: Ability to manage health-related needs will improve Outcome: Progressing   Problem: Activity: Goal: Risk for activity intolerance will decrease Outcome: Progressing   

## 2022-05-31 NOTE — Plan of Care (Signed)
  Problem: Education: Goal: Knowledge of General Education information will improve Description: Including pain rating scale, medication(s)/side effects and non-pharmacologic comfort measures Outcome: Progressing   Problem: Clinical Measurements: Goal: Ability to maintain clinical measurements within normal limits will improve Outcome: Progressing   Problem: Pain Managment: Goal: General experience of comfort will improve Outcome: Progressing   Problem: Safety: Goal: Ability to remain free from injury will improve Outcome: Progressing   Problem: Skin Integrity: Goal: Risk for impaired skin integrity will decrease Outcome: Progressing   Problem: Education: Goal: Knowledge of the prescribed therapeutic regimen will improve Outcome: Progressing

## 2022-05-31 NOTE — Discharge Summary (Signed)
Patient ID: Kathy Evans MRN: [id number] DOB/AGE: 05-16-1951 71 y.o.  Admit date: 05/30/2022 Discharge date: 05/31/2022  Admission Diagnoses: right knee severe osteoarthritis S/P right total knee arthroplasty   Discharge Diagnoses:  Principal Problem:   S/P total knee replacement, right Active Problems:   S/P TKR (total knee replacement), right   Past Medical History:  Diagnosis Date   Arthritis    Atrial flutter ([place])    Diagnosed October 2021   Breast cancer ([place]) 03/2017   Right   Bronchitis    Depression    GERD (gastroesophageal reflux disease)    Heart murmur    Hyperlipidemia    IBS (irritable bowel syndrome)    Impingement syndrome of left shoulder    Personal history of radiation therapy 2018     Procedures Performed: right total knee arthroplasty  Discharged Condition: stable  Hospital Course: Patient brought in as an outpatient for surgery.  Tolerated procedure well.  Was kept for monitoring overnight for pain control, PT, and medical monitoring postop. She required additional physical therapy and was kept overnight. She was found to be stable for DC home the day following surgery once she passed physical therapy. Patient was instructed on specific activity restrictions and all questions were answered.  Consults: None  Significant Diagnostic Studies: No additional pertinent studies  Treatments: Surgery  Discharge Exam: General: NAD Cardiac: regular rate Pulmonary: no increased work of breathing RLE: dressing CDI. leg lengths equal, intact EHL/TA/GSC, endorses distal sensation, warm well perfused foot  Disposition: Discharge disposition: 01-Home or Self Care       Discharge Instructions     Discharge patient   Complete by: As directed    Discharge disposition: 01-Home or Self Care   Discharge patient date: 05/31/2022      Allergies as of 05/31/2022       Reactions   Statins    PT STATES SHE HAS BEEN MOST STATINS IN THE PAST AND HAS  GREAT MUSCLE AND JOINT PAIN..    Tape Itching   Red and itchy        Medication List     TAKE these medications    acetaminophen 500 MG tablet Commonly known as: TYLENOL Take 2 tablets (1,000 mg total) by mouth every 6 (six) hours as needed.   ALPRAZolam 0.5 MG tablet Commonly known as: XANAX Take 0.5 mg by mouth daily as needed for anxiety or sleep.   apixaban 5 MG Tabs tablet Commonly known as: Eliquis Take 1 tablet (5 mg total) by mouth 2 (two) times daily. Take once a day for the first two days after surgery and then go back to your normal twice a day regimen What changed:  how much to take additional instructions   buPROPion 300 MG 24 hr tablet Commonly known as: WELLBUTRIN XL Take 300 mg by mouth at bedtime.   Calcium 600+D 600-20 MG-MCG Tabs Generic drug: Calcium Carb-Cholecalciferol Take 1 tablet by mouth daily.   CHIA SEED PO Take 15 mLs by mouth every morning.   COLLAGEN 1500/C PO Take 0.5 Scoops by mouth every morning. peptide   diltiazem 120 MG 24 hr capsule Commonly known as: CARDIZEM CD TAKE 1 CAPSULE BY MOUTH  DAILY   fluticasone 50 MCG/ACT nasal spray Commonly known as: FLONASE Place 1-2 sprays into both nostrils daily as needed for allergies or rhinitis.   guaiFENesin 600 MG 12 hr tablet Commonly known as: MUCINEX Take 600 mg by mouth every 12 (twelve) hours.   levocetirizine 5 MG  tablet Commonly known as: XYZAL Take 5 mg by mouth every evening.   omeprazole 40 MG capsule Commonly known as: PRILOSEC Take 40 mg by mouth daily before breakfast.   ondansetron 4 MG tablet Commonly known as: Zofran Take 1 tablet (4 mg total) by mouth every 8 (eight) hours as needed for nausea or vomiting.   oxyCODONE 5 MG immediate release tablet Commonly known as: Oxy IR/ROXICODONE Take one tab po q4-6hrs prn pain   Pataday 0.1 % ophthalmic solution Generic drug: olopatadine Place 1 drop into both eyes daily as needed for allergies.   Vitamin D3  125 MCG (5000 UT) Caps Take 5,000 Units by mouth daily.   Voltaren Arthritis Pain 1 % Gel Generic drug: diclofenac Sodium Apply 2 g topically daily as needed (Arthritis pain).   Whey Protein Powd Take 15 mLs by mouth every morning.               Durable Medical Equipment  (From admission, onward)           Start     Ordered   05/30/22 1715  DME Walker rolling  Once       Question Answer Comment  Walker: With [place] Wheels   Patient needs a walker to treat with the following condition Primary localized osteoarthritis of right knee      05/30/22 1714            Follow-up Information     Earlie Server, MD. Go on 06/13/2022.   Specialty: Orthopedic Surgery Why: Your appointment is scheduled for 1:30 in the [place] office Contact information: [address] [place] [address] [phone]         [facility] [place] Follow up.   Specialty: Home Health Services Why: HHPT will provide 6 home visits prior to starting outpatient physical therapy Contact information: [address] [place] [address] [phone]         [facility]. Go on 06/12/2022.   Why: the Outpatient facility should call you with your appointment times Contact information: [address]  [place], [place]

## 2022-06-01 DIAGNOSIS — Z79899 Other long term (current) drug therapy: Secondary | ICD-10-CM | POA: Diagnosis not present

## 2022-06-01 DIAGNOSIS — Z87891 Personal history of nicotine dependence: Secondary | ICD-10-CM | POA: Diagnosis not present

## 2022-06-01 DIAGNOSIS — M7542 Impingement syndrome of left shoulder: Secondary | ICD-10-CM | POA: Diagnosis not present

## 2022-06-01 DIAGNOSIS — I4892 Unspecified atrial flutter: Secondary | ICD-10-CM | POA: Diagnosis not present

## 2022-06-01 DIAGNOSIS — K5901 Slow transit constipation: Secondary | ICD-10-CM | POA: Diagnosis not present

## 2022-06-01 DIAGNOSIS — K219 Gastro-esophageal reflux disease without esophagitis: Secondary | ICD-10-CM | POA: Diagnosis not present

## 2022-06-01 DIAGNOSIS — Z471 Aftercare following joint replacement surgery: Secondary | ICD-10-CM | POA: Diagnosis not present

## 2022-06-01 DIAGNOSIS — K589 Irritable bowel syndrome without diarrhea: Secondary | ICD-10-CM | POA: Diagnosis not present

## 2022-06-01 DIAGNOSIS — Z853 Personal history of malignant neoplasm of breast: Secondary | ICD-10-CM | POA: Diagnosis not present

## 2022-06-01 DIAGNOSIS — Z96651 Presence of right artificial knee joint: Secondary | ICD-10-CM | POA: Diagnosis not present

## 2022-06-01 DIAGNOSIS — F32A Depression, unspecified: Secondary | ICD-10-CM | POA: Diagnosis not present

## 2022-06-01 DIAGNOSIS — R350 Frequency of micturition: Secondary | ICD-10-CM | POA: Diagnosis not present

## 2022-06-01 DIAGNOSIS — Z7901 Long term (current) use of anticoagulants: Secondary | ICD-10-CM | POA: Diagnosis not present

## 2022-06-01 DIAGNOSIS — R3915 Urgency of urination: Secondary | ICD-10-CM | POA: Diagnosis not present

## 2022-06-01 DIAGNOSIS — E785 Hyperlipidemia, unspecified: Secondary | ICD-10-CM | POA: Diagnosis not present

## 2022-06-02 ENCOUNTER — Encounter (HOSPITAL_COMMUNITY): Payer: Self-pay | Admitting: Orthopedic Surgery

## 2022-06-03 ENCOUNTER — Ambulatory Visit: Payer: Self-pay | Admitting: Physician Assistant

## 2022-06-03 DIAGNOSIS — F32A Depression, unspecified: Secondary | ICD-10-CM | POA: Diagnosis not present

## 2022-06-03 DIAGNOSIS — R3915 Urgency of urination: Secondary | ICD-10-CM | POA: Diagnosis not present

## 2022-06-03 DIAGNOSIS — K589 Irritable bowel syndrome without diarrhea: Secondary | ICD-10-CM | POA: Diagnosis not present

## 2022-06-03 DIAGNOSIS — Z7901 Long term (current) use of anticoagulants: Secondary | ICD-10-CM | POA: Diagnosis not present

## 2022-06-03 DIAGNOSIS — M7542 Impingement syndrome of left shoulder: Secondary | ICD-10-CM | POA: Diagnosis not present

## 2022-06-03 DIAGNOSIS — Z79899 Other long term (current) drug therapy: Secondary | ICD-10-CM | POA: Diagnosis not present

## 2022-06-03 DIAGNOSIS — K5901 Slow transit constipation: Secondary | ICD-10-CM | POA: Diagnosis not present

## 2022-06-03 DIAGNOSIS — Z471 Aftercare following joint replacement surgery: Secondary | ICD-10-CM | POA: Diagnosis not present

## 2022-06-03 DIAGNOSIS — R350 Frequency of micturition: Secondary | ICD-10-CM | POA: Diagnosis not present

## 2022-06-03 DIAGNOSIS — I4892 Unspecified atrial flutter: Secondary | ICD-10-CM | POA: Diagnosis not present

## 2022-06-03 DIAGNOSIS — E785 Hyperlipidemia, unspecified: Secondary | ICD-10-CM | POA: Diagnosis not present

## 2022-06-03 DIAGNOSIS — Z853 Personal history of malignant neoplasm of breast: Secondary | ICD-10-CM | POA: Diagnosis not present

## 2022-06-03 DIAGNOSIS — Z87891 Personal history of nicotine dependence: Secondary | ICD-10-CM | POA: Diagnosis not present

## 2022-06-03 DIAGNOSIS — Z96651 Presence of right artificial knee joint: Secondary | ICD-10-CM | POA: Diagnosis not present

## 2022-06-03 DIAGNOSIS — K219 Gastro-esophageal reflux disease without esophagitis: Secondary | ICD-10-CM | POA: Diagnosis not present

## 2022-06-05 DIAGNOSIS — F32A Depression, unspecified: Secondary | ICD-10-CM | POA: Diagnosis not present

## 2022-06-05 DIAGNOSIS — E785 Hyperlipidemia, unspecified: Secondary | ICD-10-CM | POA: Diagnosis not present

## 2022-06-05 DIAGNOSIS — K219 Gastro-esophageal reflux disease without esophagitis: Secondary | ICD-10-CM | POA: Diagnosis not present

## 2022-06-05 DIAGNOSIS — Z853 Personal history of malignant neoplasm of breast: Secondary | ICD-10-CM | POA: Diagnosis not present

## 2022-06-05 DIAGNOSIS — Z79899 Other long term (current) drug therapy: Secondary | ICD-10-CM | POA: Diagnosis not present

## 2022-06-05 DIAGNOSIS — K5901 Slow transit constipation: Secondary | ICD-10-CM | POA: Diagnosis not present

## 2022-06-05 DIAGNOSIS — R3915 Urgency of urination: Secondary | ICD-10-CM | POA: Diagnosis not present

## 2022-06-05 DIAGNOSIS — Z7901 Long term (current) use of anticoagulants: Secondary | ICD-10-CM | POA: Diagnosis not present

## 2022-06-05 DIAGNOSIS — Z87891 Personal history of nicotine dependence: Secondary | ICD-10-CM | POA: Diagnosis not present

## 2022-06-05 DIAGNOSIS — Z96651 Presence of right artificial knee joint: Secondary | ICD-10-CM | POA: Diagnosis not present

## 2022-06-05 DIAGNOSIS — M7542 Impingement syndrome of left shoulder: Secondary | ICD-10-CM | POA: Diagnosis not present

## 2022-06-05 DIAGNOSIS — K589 Irritable bowel syndrome without diarrhea: Secondary | ICD-10-CM | POA: Diagnosis not present

## 2022-06-05 DIAGNOSIS — I4892 Unspecified atrial flutter: Secondary | ICD-10-CM | POA: Diagnosis not present

## 2022-06-05 DIAGNOSIS — Z471 Aftercare following joint replacement surgery: Secondary | ICD-10-CM | POA: Diagnosis not present

## 2022-06-05 DIAGNOSIS — R350 Frequency of micturition: Secondary | ICD-10-CM | POA: Diagnosis not present

## 2022-06-06 DIAGNOSIS — Z7901 Long term (current) use of anticoagulants: Secondary | ICD-10-CM | POA: Diagnosis not present

## 2022-06-06 DIAGNOSIS — I4892 Unspecified atrial flutter: Secondary | ICD-10-CM | POA: Diagnosis not present

## 2022-06-06 DIAGNOSIS — R350 Frequency of micturition: Secondary | ICD-10-CM | POA: Diagnosis not present

## 2022-06-06 DIAGNOSIS — M7542 Impingement syndrome of left shoulder: Secondary | ICD-10-CM | POA: Diagnosis not present

## 2022-06-06 DIAGNOSIS — F32A Depression, unspecified: Secondary | ICD-10-CM | POA: Diagnosis not present

## 2022-06-06 DIAGNOSIS — K219 Gastro-esophageal reflux disease without esophagitis: Secondary | ICD-10-CM | POA: Diagnosis not present

## 2022-06-06 DIAGNOSIS — Z471 Aftercare following joint replacement surgery: Secondary | ICD-10-CM | POA: Diagnosis not present

## 2022-06-06 DIAGNOSIS — Z87891 Personal history of nicotine dependence: Secondary | ICD-10-CM | POA: Diagnosis not present

## 2022-06-06 DIAGNOSIS — R3915 Urgency of urination: Secondary | ICD-10-CM | POA: Diagnosis not present

## 2022-06-06 DIAGNOSIS — Z96651 Presence of right artificial knee joint: Secondary | ICD-10-CM | POA: Diagnosis not present

## 2022-06-06 DIAGNOSIS — K589 Irritable bowel syndrome without diarrhea: Secondary | ICD-10-CM | POA: Diagnosis not present

## 2022-06-06 DIAGNOSIS — Z853 Personal history of malignant neoplasm of breast: Secondary | ICD-10-CM | POA: Diagnosis not present

## 2022-06-06 DIAGNOSIS — K5901 Slow transit constipation: Secondary | ICD-10-CM | POA: Diagnosis not present

## 2022-06-06 DIAGNOSIS — E785 Hyperlipidemia, unspecified: Secondary | ICD-10-CM | POA: Diagnosis not present

## 2022-06-06 DIAGNOSIS — Z79899 Other long term (current) drug therapy: Secondary | ICD-10-CM | POA: Diagnosis not present

## 2022-06-09 DIAGNOSIS — Z79899 Other long term (current) drug therapy: Secondary | ICD-10-CM | POA: Diagnosis not present

## 2022-06-09 DIAGNOSIS — K589 Irritable bowel syndrome without diarrhea: Secondary | ICD-10-CM | POA: Diagnosis not present

## 2022-06-09 DIAGNOSIS — I4892 Unspecified atrial flutter: Secondary | ICD-10-CM | POA: Diagnosis not present

## 2022-06-09 DIAGNOSIS — K5901 Slow transit constipation: Secondary | ICD-10-CM | POA: Diagnosis not present

## 2022-06-09 DIAGNOSIS — K219 Gastro-esophageal reflux disease without esophagitis: Secondary | ICD-10-CM | POA: Diagnosis not present

## 2022-06-09 DIAGNOSIS — Z471 Aftercare following joint replacement surgery: Secondary | ICD-10-CM | POA: Diagnosis not present

## 2022-06-09 DIAGNOSIS — E785 Hyperlipidemia, unspecified: Secondary | ICD-10-CM | POA: Diagnosis not present

## 2022-06-09 DIAGNOSIS — R350 Frequency of micturition: Secondary | ICD-10-CM | POA: Diagnosis not present

## 2022-06-09 DIAGNOSIS — R3915 Urgency of urination: Secondary | ICD-10-CM | POA: Diagnosis not present

## 2022-06-09 DIAGNOSIS — M7542 Impingement syndrome of left shoulder: Secondary | ICD-10-CM | POA: Diagnosis not present

## 2022-06-09 DIAGNOSIS — Z87891 Personal history of nicotine dependence: Secondary | ICD-10-CM | POA: Diagnosis not present

## 2022-06-09 DIAGNOSIS — Z96651 Presence of right artificial knee joint: Secondary | ICD-10-CM | POA: Diagnosis not present

## 2022-06-09 DIAGNOSIS — Z853 Personal history of malignant neoplasm of breast: Secondary | ICD-10-CM | POA: Diagnosis not present

## 2022-06-09 DIAGNOSIS — F32A Depression, unspecified: Secondary | ICD-10-CM | POA: Diagnosis not present

## 2022-06-09 DIAGNOSIS — Z7901 Long term (current) use of anticoagulants: Secondary | ICD-10-CM | POA: Diagnosis not present

## 2022-06-11 DIAGNOSIS — F32A Depression, unspecified: Secondary | ICD-10-CM | POA: Diagnosis not present

## 2022-06-11 DIAGNOSIS — Z87891 Personal history of nicotine dependence: Secondary | ICD-10-CM | POA: Diagnosis not present

## 2022-06-11 DIAGNOSIS — Z96651 Presence of right artificial knee joint: Secondary | ICD-10-CM | POA: Diagnosis not present

## 2022-06-11 DIAGNOSIS — R3915 Urgency of urination: Secondary | ICD-10-CM | POA: Diagnosis not present

## 2022-06-11 DIAGNOSIS — Z7901 Long term (current) use of anticoagulants: Secondary | ICD-10-CM | POA: Diagnosis not present

## 2022-06-11 DIAGNOSIS — Z79899 Other long term (current) drug therapy: Secondary | ICD-10-CM | POA: Diagnosis not present

## 2022-06-11 DIAGNOSIS — Z853 Personal history of malignant neoplasm of breast: Secondary | ICD-10-CM | POA: Diagnosis not present

## 2022-06-11 DIAGNOSIS — Z471 Aftercare following joint replacement surgery: Secondary | ICD-10-CM | POA: Diagnosis not present

## 2022-06-11 DIAGNOSIS — I4892 Unspecified atrial flutter: Secondary | ICD-10-CM | POA: Diagnosis not present

## 2022-06-11 DIAGNOSIS — K219 Gastro-esophageal reflux disease without esophagitis: Secondary | ICD-10-CM | POA: Diagnosis not present

## 2022-06-11 DIAGNOSIS — R350 Frequency of micturition: Secondary | ICD-10-CM | POA: Diagnosis not present

## 2022-06-11 DIAGNOSIS — M7542 Impingement syndrome of left shoulder: Secondary | ICD-10-CM | POA: Diagnosis not present

## 2022-06-11 DIAGNOSIS — K5901 Slow transit constipation: Secondary | ICD-10-CM | POA: Diagnosis not present

## 2022-06-11 DIAGNOSIS — E785 Hyperlipidemia, unspecified: Secondary | ICD-10-CM | POA: Diagnosis not present

## 2022-06-11 DIAGNOSIS — K589 Irritable bowel syndrome without diarrhea: Secondary | ICD-10-CM | POA: Diagnosis not present

## 2022-06-13 DIAGNOSIS — M1711 Unilateral primary osteoarthritis, right knee: Secondary | ICD-10-CM | POA: Diagnosis not present

## 2022-06-17 ENCOUNTER — Ambulatory Visit (HOSPITAL_COMMUNITY): Payer: Medicare Other | Attending: Orthopedic Surgery | Admitting: Physical Therapy

## 2022-06-17 ENCOUNTER — Encounter (HOSPITAL_COMMUNITY): Payer: Self-pay | Admitting: Physical Therapy

## 2022-06-17 DIAGNOSIS — R29898 Other symptoms and signs involving the musculoskeletal system: Secondary | ICD-10-CM | POA: Diagnosis not present

## 2022-06-17 DIAGNOSIS — M25661 Stiffness of right knee, not elsewhere classified: Secondary | ICD-10-CM | POA: Diagnosis not present

## 2022-06-17 DIAGNOSIS — M25561 Pain in right knee: Secondary | ICD-10-CM | POA: Insufficient documentation

## 2022-06-17 DIAGNOSIS — M6281 Muscle weakness (generalized): Secondary | ICD-10-CM | POA: Insufficient documentation

## 2022-06-17 DIAGNOSIS — R2689 Other abnormalities of gait and mobility: Secondary | ICD-10-CM | POA: Diagnosis not present

## 2022-06-17 NOTE — Therapy (Signed)
OUTPATIENT PHYSICAL THERAPY LOWER EXTREMITY EVALUATION   Patient Name: Kathy Evans MRN: [id number] DOB:1951/10/13, 71 y.o., female Today's Date: 06/17/2022   PT End of Session - 06/17/22 0814     Visit Number 1    Number of Visits 12    Date for PT Re-Evaluation 07/29/22    Authorization Type [employer]    Progress Note Due on Visit 10    PT Start Time 0815    PT Stop Time 0854    PT Time Calculation (min) 39 min    Activity Tolerance Patient tolerated treatment well    Behavior During Therapy [facility] for tasks assessed/performed             Past Medical History:  Diagnosis Date   Arthritis    Atrial flutter ([place])    Diagnosed October 2021   Breast cancer ([place]) 03/2017   Right   Bronchitis    Depression    GERD (gastroesophageal reflux disease)    Heart murmur    Hyperlipidemia    IBS (irritable bowel syndrome)    Impingement syndrome of left shoulder    Personal history of radiation therapy 2018   Past Surgical History:  Procedure Laterality Date   ADENOIDECTOMY     APPENDECTOMY     BREAST BIOPSY Left    benign   BREAST LUMPECTOMY Right 2018   BUNIONECTOMY Right    CARDIAC CATHETERIZATION     CATARACT EXTRACTION, BILATERAL     CHOLECYSTECTOMY     colon adhesion     COLONOSCOPY WITH PROPOFOL N/A 02/02/2018   Dr. Oneida Alar: multiple small and large-mouthed diverticula in recto-sigmoid colon, sigmoid, and descending. TI normal. Internal hemorrhoids during retroflexion, small. Moderate external hemorrhoids   Full mastectomy Right    MASTECTOMY, PARTIAL Right    PROLAPSED UTERINE FIBROID LIGATION     TOE SURGERY Left    TONSILLECTOMY     TOTAL KNEE ARTHROPLASTY Right 05/30/2022   Procedure: TOTAL KNEE ARTHROPLASTY;  Surgeon: Earlie Server, MD;  Location: [facility];  Service: Orthopedics;  Laterality: Right;   TUBAL LIGATION     VAGINAL HYSTERECTOMY     VESICOVAGINAL FISTULA CLOSURE     Patient Active Problem List   Diagnosis Date Noted   S/P TKR (total knee  replacement), right 05/30/2022   S/P total knee replacement, right 05/30/2022   GERD (gastroesophageal reflux disease) 02/22/2018   Heme positive stool 11/26/2017   Irritable bowel syndrome with both constipation and diarrhea 09/25/2017   Left lower quadrant abdominal tenderness without rebound tenderness 09/17/2017   History of breast cancer 09/17/2017   Vaginal atrophy 09/17/2017   Vaginal discharge 09/17/2017   BV (bacterial vaginosis) 09/17/2017   Ductal carcinoma of breast, estrogen receptor positive, stage 1 ([place]) 08/18/2017    PCP: Celene Squibb MD  REFERRING PROVIDER: Earlie Server, MD   REFERRING DIAG: s/p rt TKR per Earlie Server, MD DOS 05/30/2022   THERAPY DIAG:  Right knee pain, unspecified chronicity  Stiffness of right knee, not elsewhere classified  Muscle weakness (generalized)  Other abnormalities of gait and mobility  Other symptoms and signs involving the musculoskeletal system  Rationale for Evaluation and Treatment Rehabilitation  ONSET DATE: 05/30/22  SUBJECTIVE:   SUBJECTIVE STATEMENT: Patient states she overdid it yesterday. She got to 90 degrees on her CPM. She is normally independent without AD. Continued pain with lifting the leg. Walking is doing alright with RW. Trouble with squats. Sciatic nerve problems with L leg. Had to do PT years  ago for knee.   PERTINENT HISTORY: Hx breast cancer, HLD, GERD, IBS, depression  PAIN:  Are you having pain? Yes: NPRS scale: current 0/10 Pain location: R knee  Pain description: sore, tight Aggravating factors: lifting, bending Relieving factors: rest, meds  PRECAUTIONS: None  WEIGHT BEARING RESTRICTIONS No  FALLS:  Has patient fallen in last 6 months? No  LIVING ENVIRONMENT: Lives with: lives with their spouse Lives in: House/apartment Stairs: Yes: External: 5 steps; on right going up Has following equipment at home: Single point cane and Walker - 2 wheeled  OCCUPATION: Retired  PLOF:  Independent  PATIENT GOALS get her mobility back   OBJECTIVE:   PATIENT SURVEYS:  FOTO 39% function  COGNITION:  Overall cognitive status: Within functional limits for tasks assessed     SENSATION: WFL   POSTURE: No Significant postural limitations  PALPATION: TTP knee at joint line, no tenderness throughout quads or calf  LOWER EXTREMITY ROM:  Active ROM Right eval Left eval  Hip flexion    Hip extension    Hip abduction    Hip adduction    Hip internal rotation    Hip external rotation    Knee flexion 98 123  Knee extension Lacking 4 0  Ankle dorsiflexion    Ankle plantarflexion    Ankle inversion    Ankle eversion     (Blank rows = not tested)  LOWER EXTREMITY MMT:  MMT Right eval Left eval  Hip flexion 4- 5  Hip extension    Hip abduction    Hip adduction    Hip internal rotation    Hip external rotation    Knee flexion 4+ 5  Knee extension 4 * 5  Ankle dorsiflexion 5 5  Ankle plantarflexion    Ankle inversion    Ankle eversion     (Blank rows = not tested) *= pain    FUNCTIONAL TESTS:  2 minute walk test: 200 feet Transfers: RLE extended, relies L and UE support  GAIT: Distance walked: 200 feet Assistive device utilized: Walker - 2 wheeled Level of assistance: Modified independence Comments: 2 MWT; slightly antalgic with RW, decreased R knee flexion/extension    TODAY'S TREATMENT: 06/17/22 SLR with quad set 1x 10 Heel slides with strap 10 x 10 second holds   PATIENT EDUCATION:  Education details: Patient educated on exam findings, POC, scope of PT, HEP, and knee positioning at rest. Person educated: Patient Education method: Consulting civil engineer, Demonstration, and Handouts Education comprehension: verbalized understanding, returned demonstration, verbal cues required, and tactile cues required   HOME EXERCISE PROGRAM: Access Code: [email or web address] URL: [email or web address] Date: 06/17/2022 - Active Straight Leg Raise with  Quad Set  - 3 x daily - 7 x weekly - 2 sets - 10 reps - Supine Heel Slide with Strap  - 3 x daily - 7 x weekly - 10 reps - 5-10 second hold  ASSESSMENT:  CLINICAL IMPRESSION: Patient a 71 y.o. y.o. female who was seen today for physical therapy evaluation and treatment for s/p R TKR with DOS 05/30/22. Patient presents with pain limited deficits in R knee strength, ROM, endurance, activity tolerance, balance, gait, and functional mobility with ADL. Patient is having to modify and restrict ADL as indicated by outcome measure score as well as subjective information and objective measures which is affecting overall participation. Patient will benefit from skilled physical therapy in order to improve function and reduce impairment.   OBJECTIVE IMPAIRMENTS Abnormal gait, decreased activity tolerance, decreased balance, decreased endurance, decreased mobility,  difficulty walking, decreased ROM, decreased strength, increased muscle spasms, impaired flexibility, improper body mechanics, and pain.   ACTIVITY LIMITATIONS lifting, bending, standing, squatting, sleeping, stairs, transfers, bathing, locomotion level, and caring for others  PARTICIPATION LIMITATIONS: meal prep, cleaning, laundry, shopping, community activity, and yard work  PERSONAL FACTORS Time since onset of injury/illness/exacerbation and 3+ comorbidities: : Anxiety , Arthritis, Back pain, hx Cancer, Depression,GERD  are also affecting patient's functional outcome.   REHAB POTENTIAL: Good  CLINICAL DECISION MAKING: Stable/uncomplicated  EVALUATION COMPLEXITY: Low   GOALS: Goals reviewed with patient? Yes  SHORT TERM GOALS: Target date: 07/08/2022  Patient will be independent with HEP in order to improve functional outcomes. Baseline:  Goal status: INITIAL  2.  Patient will report at least 25% improvement in symptoms for improved quality of life. Baseline:  Goal status: INITIAL  3.  Patient will be able to transfer to  standing/sitting with equal weightbearing without UE support to demonstrate improved functional strength. Baseline: relies LLE with UE use Goal status: INITIAL    LONG TERM GOALS: Target date: 07/29/2022  Patient will report at least 75% improvement in symptoms for improved quality of life. Baseline:  Goal status: INITIAL  2.  Patient will improve FOTO score by at least 23 points in order to indicate improved tolerance to activity. Baseline: 39% function Goal status: INITIAL  3.  Patient will be able to navigate stairs with reciprocal pattern without compensation in order to demonstrate improved LE strength. Baseline:  Goal status: INITIAL  4.  Patient will be able to ambulate at least 425 feet in 2MWT in order to demonstrate improved tolerance to activity. Baseline: 200 feet with RW Goal status: INITIAL  5.  Patient will improve ROM for R knee extension/flexion to 0-120 degrees to improve squatting, and other functional mobility. Baseline: lacking 4 to 98 Goal status: INITIAL     PLAN: PT FREQUENCY: 2x/week  PT DURATION: 6 weeks  PLANNED INTERVENTIONS: Therapeutic exercises, Therapeutic activity, Neuromuscular re-education, Balance training, Gait training, Patient/Family education, Joint manipulation, Joint mobilization, Stair training, Orthotic/Fit training, DME instructions, Aquatic Therapy, Dry Needling, Electrical stimulation, Spinal manipulation, Spinal mobilization, Cryotherapy, Moist heat, Compression bandaging, scar mobilization, Splintting, Taping, Traction, Ultrasound, Ionotophoresis '4mg'$ /ml Dexamethasone, and Manual therapy   PLAN FOR NEXT SESSION: R knee mobility, quad strength, functional strength and progress all as able   Mearl Latin, PT 06/17/2022, 9:01 AM

## 2022-06-19 ENCOUNTER — Ambulatory Visit (HOSPITAL_COMMUNITY): Payer: Medicare Other | Admitting: Physical Therapy

## 2022-06-19 ENCOUNTER — Encounter (HOSPITAL_COMMUNITY): Payer: Self-pay | Admitting: Physical Therapy

## 2022-06-19 DIAGNOSIS — R2689 Other abnormalities of gait and mobility: Secondary | ICD-10-CM | POA: Diagnosis not present

## 2022-06-19 DIAGNOSIS — M25661 Stiffness of right knee, not elsewhere classified: Secondary | ICD-10-CM

## 2022-06-19 DIAGNOSIS — M6281 Muscle weakness (generalized): Secondary | ICD-10-CM | POA: Diagnosis not present

## 2022-06-19 DIAGNOSIS — R29898 Other symptoms and signs involving the musculoskeletal system: Secondary | ICD-10-CM | POA: Diagnosis not present

## 2022-06-19 DIAGNOSIS — M25561 Pain in right knee: Secondary | ICD-10-CM

## 2022-06-19 NOTE — Therapy (Signed)
OUTPATIENT PHYSICAL THERAPY TREATMENT   Patient Name: Kathy Evans MRN: [id number] DOB:12-09-1950, 71 y.o., female Today's Date: 06/19/2022   PT End of Session - 06/19/22 0817     Visit Number 2    Number of Visits 12    Date for PT Re-Evaluation 07/29/22    Authorization Type [employer] Medicare    Progress Note Due on Visit 10    PT Start Time 0818    PT Stop Time 0856    PT Time Calculation (min) 38 min    Activity Tolerance Patient tolerated treatment well    Behavior During Therapy [facility] for tasks assessed/performed             Past Medical History:  Diagnosis Date   Arthritis    Atrial flutter ([place])    Diagnosed October 2021   Breast cancer ([place]) 03/2017   Right   Bronchitis    Depression    GERD (gastroesophageal reflux disease)    Heart murmur    Hyperlipidemia    IBS (irritable bowel syndrome)    Impingement syndrome of left shoulder    Personal history of radiation therapy 2018   Past Surgical History:  Procedure Laterality Date   ADENOIDECTOMY     APPENDECTOMY     BREAST BIOPSY Left    benign   BREAST LUMPECTOMY Right 2018   BUNIONECTOMY Right    CARDIAC CATHETERIZATION     CATARACT EXTRACTION, BILATERAL     CHOLECYSTECTOMY     colon adhesion     COLONOSCOPY WITH PROPOFOL N/A 02/02/2018   Dr. Oneida Alar: multiple small and large-mouthed diverticula in recto-sigmoid colon, sigmoid, and descending. TI normal. Internal hemorrhoids during retroflexion, small. Moderate external hemorrhoids   Full mastectomy Right    MASTECTOMY, PARTIAL Right    PROLAPSED UTERINE FIBROID LIGATION     TOE SURGERY Left    TONSILLECTOMY     TOTAL KNEE ARTHROPLASTY Right 05/30/2022   Procedure: TOTAL KNEE ARTHROPLASTY;  Surgeon: Earlie Server, MD;  Location: [facility];  Service: Orthopedics;  Laterality: Right;   TUBAL LIGATION     VAGINAL HYSTERECTOMY     VESICOVAGINAL FISTULA CLOSURE     Patient Active Problem List   Diagnosis Date Noted   S/P TKR (total knee replacement),  right 05/30/2022   S/P total knee replacement, right 05/30/2022   GERD (gastroesophageal reflux disease) 02/22/2018   Heme positive stool 11/26/2017   Irritable bowel syndrome with both constipation and diarrhea 09/25/2017   Left lower quadrant abdominal tenderness without rebound tenderness 09/17/2017   History of breast cancer 09/17/2017   Vaginal atrophy 09/17/2017   Vaginal discharge 09/17/2017   BV (bacterial vaginosis) 09/17/2017   Ductal carcinoma of breast, estrogen receptor positive, stage 1 ([place]) 08/18/2017    PCP: Celene Squibb MD  REFERRING PROVIDER: Earlie Server, MD   REFERRING DIAG: s/p rt TKR per Earlie Server, MD DOS 05/30/2022   THERAPY DIAG:  Right knee pain, unspecified chronicity  Stiffness of right knee, not elsewhere classified  Muscle weakness (generalized)  Other abnormalities of gait and mobility  Other symptoms and signs involving the musculoskeletal system  Rationale for Evaluation and Treatment Rehabilitation  ONSET DATE: 05/30/22  SUBJECTIVE:   SUBJECTIVE STATEMENT: Patient states knee is feeling solid today when she got up. Exercises went alright.   PERTINENT HISTORY: Hx breast cancer, HLD, GERD, IBS, depression  PAIN:  Are you having pain? Yes: NPRS scale: current 0/10 Pain location: R knee  Pain description: sore, tight Aggravating factors:  lifting, bending Relieving factors: rest, meds  PRECAUTIONS: None  WEIGHT BEARING RESTRICTIONS No  FALLS:  Has patient fallen in last 6 months? No  LIVING ENVIRONMENT: Lives with: lives with their spouse Lives in: House/apartment Stairs: Yes: External: 5 steps; on right going up Has following equipment at home: Single point cane and Walker - 2 wheeled  OCCUPATION: Retired  PLOF: Independent  PATIENT GOALS get her mobility back   OBJECTIVE:   PATIENT SURVEYS:  FOTO 39% function  COGNITION:  Overall cognitive status: Within functional limits for tasks  assessed     SENSATION: WFL   POSTURE: No Significant postural limitations  PALPATION: TTP knee at joint line, no tenderness throughout quads or calf  LOWER EXTREMITY ROM:  Active ROM Right eval Left eval Right 06/19/22  Hip flexion     Hip extension     Hip abduction     Hip adduction     Hip internal rotation     Hip external rotation     Knee flexion 98 123 106 AAROM 108  Knee extension Lacking 4 0 Lacking 3  Ankle dorsiflexion     Ankle plantarflexion     Ankle inversion     Ankle eversion      (Blank rows = not tested)  LOWER EXTREMITY MMT:  MMT Right eval Left eval  Hip flexion 4- 5  Hip extension    Hip abduction    Hip adduction    Hip internal rotation    Hip external rotation    Knee flexion 4+ 5  Knee extension 4 * 5  Ankle dorsiflexion 5 5  Ankle plantarflexion    Ankle inversion    Ankle eversion     (Blank rows = not tested) *= pain    FUNCTIONAL TESTS:  2 minute walk test: 200 feet Transfers: RLE extended, relies L and UE support  GAIT: Distance walked: 200 feet Assistive device utilized: Walker - 2 wheeled Level of assistance: Modified independence Comments: 2 MWT; slightly antalgic with RW, decreased R knee flexion/extension    TODAY'S TREATMENT: 06/19/22 Bike full revolutions 5 minutes seat 14 to 12 Heel slides with strap 10 x 10 second holds RLE SLR with quad set 2x 10 RLE Bridge 2x 10  Gait with SPC 200 feet HR 2x 10  TR 2x 10  Standing hip abduction 2x 10 bilateral   06/17/22 SLR with quad set 1x 10 Heel slides with strap 10 x 10 second holds   PATIENT EDUCATION:  Education details: 06/19/22: HEP; EVAL: Patient educated on exam findings, POC, scope of PT, HEP, and knee positioning at rest. Person educated: Patient Education method: Explanation, Demonstration, and Handouts Education comprehension: verbalized understanding, returned demonstration, verbal cues required, and tactile cues required   HOME EXERCISE  PROGRAM: Access Code: [email or web address] URL: [email or web address] 06/19/22 - Heel Raises with Counter Support  - 1 x daily - 7 x weekly - 2 sets - 10 reps - Toe Raises with Counter Support  - 1 x daily - 7 x weekly - 2 sets - 10 reps - Standing Hip Abduction with Counter Support  - 1 x daily - 7 x weekly - 2 sets - 10 reps  Date: 06/17/2022 - Active Straight Leg Raise with Quad Set  - 3 x daily - 7 x weekly - 2 sets - 10 reps - Supine Heel Slide with Strap  - 3 x daily - 7 x weekly - 10 reps - 5-10 second hold  ASSESSMENT:  CLINICAL IMPRESSION:  Began session on bike for ROM and warm up. Patient able to make full revolutions initially at seat 14 and after a few minutes to seat 12. Patient demonstrating improving ROM today from lacking 3 to 106. No quad lag with SLR. Patient given some cueing for proper SPC use with good carry over. Tolerates new standing exercises well. Patient will continue to benefit from physical therapy in order to improve function and reduce impairment.    OBJECTIVE IMPAIRMENTS Abnormal gait, decreased activity tolerance, decreased balance, decreased endurance, decreased mobility, difficulty walking, decreased ROM, decreased strength, increased muscle spasms, impaired flexibility, improper body mechanics, and pain.   ACTIVITY LIMITATIONS lifting, bending, standing, squatting, sleeping, stairs, transfers, bathing, locomotion level, and caring for others  PARTICIPATION LIMITATIONS: meal prep, cleaning, laundry, shopping, community activity, and yard work  PERSONAL FACTORS Time since onset of injury/illness/exacerbation and 3+ comorbidities: : Anxiety , Arthritis, Back pain, hx Cancer, Depression,GERD  are also affecting patient's functional outcome.   REHAB POTENTIAL: Good  CLINICAL DECISION MAKING: Stable/uncomplicated  EVALUATION COMPLEXITY: Low   GOALS: Goals reviewed with patient? Yes  SHORT TERM GOALS: Target date: 07/08/2022  Patient will be  independent with HEP in order to improve functional outcomes. Baseline:  Goal status: IN PROGRESS  2.  Patient will report at least 25% improvement in symptoms for improved quality of life. Baseline:  Goal status: IN PROGRESS  3.  Patient will be able to transfer to standing/sitting with equal weightbearing without UE support to demonstrate improved functional strength. Baseline: relies LLE with UE use Goal status: IN PROGRESS    LONG TERM GOALS: Target date: 07/29/2022  Patient will report at least 75% improvement in symptoms for improved quality of life. Baseline:  Goal status: IN PROGRESS  2.  Patient will improve FOTO score by at least 23 points in order to indicate improved tolerance to activity. Baseline: 39% function Goal status: IN PROGRESS  3.  Patient will be able to navigate stairs with reciprocal pattern without compensation in order to demonstrate improved LE strength. Baseline:  Goal status: IN PROGRESS  4.  Patient will be able to ambulate at least 425 feet in 2MWT in order to demonstrate improved tolerance to activity. Baseline: 200 feet with RW Goal status: IN PROGRESS  5.  Patient will improve ROM for R knee extension/flexion to 0-120 degrees to improve squatting, and other functional mobility. Baseline: lacking 4 to 98 Goal status: IN PROGRESS     PLAN: PT FREQUENCY: 2x/week  PT DURATION: 6 weeks  PLANNED INTERVENTIONS: Therapeutic exercises, Therapeutic activity, Neuromuscular re-education, Balance training, Gait training, Patient/Family education, Joint manipulation, Joint mobilization, Stair training, Orthotic/Fit training, DME instructions, Aquatic Therapy, Dry Needling, Electrical stimulation, Spinal manipulation, Spinal mobilization, Cryotherapy, Moist heat, Compression bandaging, scar mobilization, Splintting, Taping, Traction, Ultrasound, Ionotophoresis '4mg'$ /ml Dexamethasone, and Manual therapy   PLAN FOR NEXT SESSION: R knee mobility, quad  strength, functional strength and progress all as able   Mearl Latin, PT 06/19/2022, 8:18 AM

## 2022-06-24 ENCOUNTER — Ambulatory Visit (HOSPITAL_COMMUNITY): Payer: Medicare Other

## 2022-06-24 ENCOUNTER — Encounter (HOSPITAL_COMMUNITY): Payer: Self-pay

## 2022-06-24 DIAGNOSIS — M25561 Pain in right knee: Secondary | ICD-10-CM | POA: Diagnosis not present

## 2022-06-24 DIAGNOSIS — R2689 Other abnormalities of gait and mobility: Secondary | ICD-10-CM

## 2022-06-24 DIAGNOSIS — R29898 Other symptoms and signs involving the musculoskeletal system: Secondary | ICD-10-CM

## 2022-06-24 DIAGNOSIS — M25661 Stiffness of right knee, not elsewhere classified: Secondary | ICD-10-CM | POA: Diagnosis not present

## 2022-06-24 DIAGNOSIS — M6281 Muscle weakness (generalized): Secondary | ICD-10-CM

## 2022-06-24 NOTE — Therapy (Addendum)
OUTPATIENT PHYSICAL THERAPY TREATMENT   Patient Name: Kathy Evans MRN: [id number] DOB:07/15/1951, 71 y.o., female Today's Date: 06/24/2022   PT End of Session - 06/24/22 0823     Visit Number 3    Number of Visits 12    Date for PT Re-Evaluation 07/29/22    Authorization Type [employer] Medicare    Progress Note Due on Visit 10    PT Start Time 0817    PT Stop Time 0903    PT Time Calculation (min) 46 min    Activity Tolerance Patient tolerated treatment well    Behavior During Therapy [facility] for tasks assessed/performed              Past Medical History:  Diagnosis Date   Arthritis    Atrial flutter ([place])    Diagnosed October 2021   Breast cancer ([place]) 03/2017   Right   Bronchitis    Depression    GERD (gastroesophageal reflux disease)    Heart murmur    Hyperlipidemia    IBS (irritable bowel syndrome)    Impingement syndrome of left shoulder    Personal history of radiation therapy 2018   Past Surgical History:  Procedure Laterality Date   ADENOIDECTOMY     APPENDECTOMY     BREAST BIOPSY Left    benign   BREAST LUMPECTOMY Right 2018   BUNIONECTOMY Right    CARDIAC CATHETERIZATION     CATARACT EXTRACTION, BILATERAL     CHOLECYSTECTOMY     colon adhesion     COLONOSCOPY WITH PROPOFOL N/A 02/02/2018   Dr. Oneida Alar: multiple small and large-mouthed diverticula in recto-sigmoid colon, sigmoid, and descending. TI normal. Internal hemorrhoids during retroflexion, small. Moderate external hemorrhoids   Full mastectomy Right    MASTECTOMY, PARTIAL Right    PROLAPSED UTERINE FIBROID LIGATION     TOE SURGERY Left    TONSILLECTOMY     TOTAL KNEE ARTHROPLASTY Right 05/30/2022   Procedure: TOTAL KNEE ARTHROPLASTY;  Surgeon: Earlie Server, MD;  Location: [facility];  Service: Orthopedics;  Laterality: Right;   TUBAL LIGATION     VAGINAL HYSTERECTOMY     VESICOVAGINAL FISTULA CLOSURE     Patient Active Problem List   Diagnosis Date Noted   S/P TKR (total knee replacement),  right 05/30/2022   S/P total knee replacement, right 05/30/2022   GERD (gastroesophageal reflux disease) 02/22/2018   Heme positive stool 11/26/2017   Irritable bowel syndrome with both constipation and diarrhea 09/25/2017   Left lower quadrant abdominal tenderness without rebound tenderness 09/17/2017   History of breast cancer 09/17/2017   Vaginal atrophy 09/17/2017   Vaginal discharge 09/17/2017   BV (bacterial vaginosis) 09/17/2017   Ductal carcinoma of breast, estrogen receptor positive, stage 1 ([place]) 08/18/2017    PCP: Celene Squibb MD  REFERRING PROVIDER: Earlie Server, MD   REFERRING DIAG: s/p rt TKR per Earlie Server, MD DOS 05/30/2022  Next scheduled apt 07/18/22.  THERAPY DIAG:  Right knee pain, unspecified chronicity  Stiffness of right knee, not elsewhere classified  Muscle weakness (generalized)  Other abnormalities of gait and mobility  Other symptoms and signs involving the musculoskeletal system  Rationale for Evaluation and Treatment Rehabilitation  ONSET DATE: 05/30/22  SUBJECTIVE:   SUBJECTIVE STATEMENT: Pt stated she felt like she overdid it Sunday, went to church for first time.  Reports she increased she steps to 3,000, has been around 1,000spm average.  No real pain, just sore/achey pain.  Reports compliance with HEP daily and RICE  techniques.  Had to take muscle relaxer last night.  PERTINENT HISTORY: Hx breast cancer, HLD, GERD, IBS, depression  PAIN:  Are you having pain? Yes: NPRS scale: current 0/10 Pain location: R knee  Pain description: sore, tight Aggravating factors: lifting, bending Relieving factors: rest, meds  PRECAUTIONS: None  WEIGHT BEARING RESTRICTIONS No  FALLS:  Has patient fallen in last 6 months? No  LIVING ENVIRONMENT: Lives with: lives with their spouse Lives in: House/apartment Stairs: Yes: External: 5 steps; on right going up Has following equipment at home: Single point cane and Walker - 2  wheeled  OCCUPATION: Retired  PLOF: Independent  PATIENT GOALS get her mobility back   OBJECTIVE:   PATIENT SURVEYS:  FOTO 39% function  COGNITION:  Overall cognitive status: Within functional limits for tasks assessed     SENSATION: WFL   POSTURE: No Significant postural limitations  PALPATION: TTP knee at joint line, no tenderness throughout quads or calf  LOWER EXTREMITY ROM:  Active ROM Right eval Left eval Right 06/19/22  Hip flexion     Hip extension     Hip abduction     Hip adduction     Hip internal rotation     Hip external rotation     Knee flexion 98 123 106 AAROM 108  Knee extension Lacking 4 0 Lacking 3  Ankle dorsiflexion     Ankle plantarflexion     Ankle inversion     Ankle eversion      (Blank rows = not tested)  LOWER EXTREMITY MMT:  MMT Right eval Left eval  Hip flexion 4- 5  Hip extension    Hip abduction    Hip adduction    Hip internal rotation    Hip external rotation    Knee flexion 4+ 5  Knee extension 4 * 5  Ankle dorsiflexion 5 5  Ankle plantarflexion    Ankle inversion    Ankle eversion     (Blank rows = not tested) *= pain    FUNCTIONAL TESTS:  2 minute walk test: 200 feet Transfers: RLE extended, relies L and UE support  GAIT: Distance walked: 200 feet Assistive device utilized: Walker - 2 wheeled Level of assistance: Modified independence Comments: 2 MWT; slightly antalgic with RW, decreased R knee flexion/extension    TODAY'S TREATMENT: 06/24/22 Bike full revoluation x 5 min seat 14 to 12 2MWT 316f no AD (noted Trendelenburg Lt LE) Rockerboard DF/PF and lateral movements 244m each with HHA Knee drive on 1202HEtep 5x 10" Hamstring stretch 3x 30" on 12in step TKE 10x 5" with RTB  Sidelying: clam 10x 5"  Supine: quad sets 10x5"  SAQ 10x 5"  Heel slides 10 AROM 3-106 degrees, AAROM 3-116 with rope     06/19/22 Bike full revolutions 5 minutes seat 14 to 12 Heel slides with strap 10 x 10 second  holds RLE SLR with quad set 2x 10 RLE Bridge 2x 10  Gait with SPC 200 feet HR 2x 10  TR 2x 10  Standing hip abduction 2x 10 bilateral   06/17/22 SLR with quad set 1x 10 Heel slides with strap 10 x 10 second holds   PATIENT EDUCATION:  Education details: 06/19/22: HEP; EVAL: Patient educated on exam findings, POC, scope of PT, HEP, and knee positioning at rest. Person educated: Patient Education method: Explanation, Demonstration, and Handouts Education comprehension: verbalized understanding, returned demonstration, verbal cues required, and tactile cues required   HOME EXERCISE PROGRAM: Access Code: [email or web address] URL: [email or web address]  06/19/22 - Heel Raises with Counter Support  - 1 x daily - 7 x weekly - 2 sets - 10 reps - Toe Raises with Counter Support  - 1 x daily - 7 x weekly - 2 sets - 10 reps - Standing Hip Abduction with Counter Support  - 1 x daily - 7 x weekly - 2 sets - 10 reps  Date: 06/17/2022 - Active Straight Leg Raise with Quad Set  - 3 x daily - 7 x weekly - 2 sets - 10 reps - Supine Heel Slide with Strap  - 3 x daily - 7 x weekly - 10 reps - 5-10 second hold  06/24/22: clam BLE  ASSESSMENT:  CLINICAL IMPRESSION: Session focus with ROM and hip strengthening.  Reports she has been ambulating without AD at home, 2MWT complete without AD this session with noted trendelenburg mechanics.  Added clams to HEP as pt unable to complete sidelying abduction due to weakness.  Also added stretches for knee mobility and rockerboard to improve equal weight distribution.  Pt tolerated well to new exercises with no reports of pain through session.  AROM 3-106, able to improve flexion with AAROM rope 3-116 degrees.   OBJECTIVE IMPAIRMENTS Abnormal gait, decreased activity tolerance, decreased balance, decreased endurance, decreased mobility, difficulty walking, decreased ROM, decreased strength, increased muscle spasms, impaired flexibility, improper body mechanics, and  pain.   ACTIVITY LIMITATIONS lifting, bending, standing, squatting, sleeping, stairs, transfers, bathing, locomotion level, and caring for others  PARTICIPATION LIMITATIONS: meal prep, cleaning, laundry, shopping, community activity, and yard work  PERSONAL FACTORS Time since onset of injury/illness/exacerbation and 3+ comorbidities: : Anxiety , Arthritis, Back pain, hx Cancer, Depression,GERD  are also affecting patient's functional outcome.   REHAB POTENTIAL: Good  CLINICAL DECISION MAKING: Stable/uncomplicated  EVALUATION COMPLEXITY: Low   GOALS: Goals reviewed with patient? Yes  SHORT TERM GOALS: Target date: 07/08/2022  Patient will be independent with HEP in order to improve functional outcomes. Baseline:  Goal status: IN PROGRESS  2.  Patient will report at least 25% improvement in symptoms for improved quality of life. Baseline:  Goal status: IN PROGRESS  3.  Patient will be able to transfer to standing/sitting with equal weightbearing without UE support to demonstrate improved functional strength. Baseline: relies LLE with UE use Goal status: IN PROGRESS    LONG TERM GOALS: Target date: 07/29/2022  Patient will report at least 75% improvement in symptoms for improved quality of life. Baseline:  Goal status: IN PROGRESS  2.  Patient will improve FOTO score by at least 23 points in order to indicate improved tolerance to activity. Baseline: 39% function Goal status: IN PROGRESS  3.  Patient will be able to navigate stairs with reciprocal pattern without compensation in order to demonstrate improved LE strength. Baseline:  Goal status: IN PROGRESS  4.  Patient will be able to ambulate at least 425 feet in 2MWT in order to demonstrate improved tolerance to activity. Baseline: 200 feet with RW Goal status: IN PROGRESS  5.  Patient will improve ROM for R knee extension/flexion to 0-120 degrees to improve squatting, and other functional mobility. Baseline: lacking  4 to 98 Goal status: IN PROGRESS     PLAN: PT FREQUENCY: 2x/week  PT DURATION: 6 weeks  PLANNED INTERVENTIONS: Therapeutic exercises, Therapeutic activity, Neuromuscular re-education, Balance training, Gait training, Patient/Family education, Joint manipulation, Joint mobilization, Stair training, Orthotic/Fit training, DME instructions, Aquatic Therapy, Dry Needling, Electrical stimulation, Spinal manipulation, Spinal mobilization, Cryotherapy, Moist heat, Compression bandaging, scar  mobilization, Splintting, Taping, Traction, Ultrasound, Ionotophoresis '4mg'$ /ml Dexamethasone, and Manual therapy   PLAN FOR NEXT SESSION: R knee mobility, quad and gluteal strength, functional strength and progress all as able  Ihor Austin, LPTA/CLT; CBIS [phone]  Aldona Lento, PTA 06/24/2022, 9:38 AM

## 2022-06-26 ENCOUNTER — Ambulatory Visit (HOSPITAL_COMMUNITY): Payer: Medicare Other | Admitting: Physical Therapy

## 2022-06-26 ENCOUNTER — Encounter (HOSPITAL_COMMUNITY): Payer: Self-pay | Admitting: Physical Therapy

## 2022-06-26 DIAGNOSIS — M25561 Pain in right knee: Secondary | ICD-10-CM | POA: Diagnosis not present

## 2022-06-26 DIAGNOSIS — R2689 Other abnormalities of gait and mobility: Secondary | ICD-10-CM

## 2022-06-26 DIAGNOSIS — M25661 Stiffness of right knee, not elsewhere classified: Secondary | ICD-10-CM | POA: Diagnosis not present

## 2022-06-26 DIAGNOSIS — R29898 Other symptoms and signs involving the musculoskeletal system: Secondary | ICD-10-CM | POA: Diagnosis not present

## 2022-06-26 DIAGNOSIS — M6281 Muscle weakness (generalized): Secondary | ICD-10-CM

## 2022-06-26 NOTE — Therapy (Signed)
OUTPATIENT PHYSICAL THERAPY TREATMENT   Patient Name: Kathy Evans MRN: [id number] DOB:1950/12/29, 71 y.o., female Today's Date: 06/26/2022   PT End of Session - 06/26/22 0858    Visit Number 4    Number of Visits 12    Date for PT Re-Evaluation 07/29/22    Authorization Type [employer] Medicare    Progress Note Due on Visit 10    PT Start Time 0817    PT Stop Time 0858    PT Time Calculation (min) 41 min               Past Medical History:  Diagnosis Date   Arthritis    Atrial flutter ([place])    Diagnosed October 2021   Breast cancer ([place]) 03/2017   Right   Bronchitis    Depression    GERD (gastroesophageal reflux disease)    Heart murmur    Hyperlipidemia    IBS (irritable bowel syndrome)    Impingement syndrome of left shoulder    Personal history of radiation therapy 2018   Past Surgical History:  Procedure Laterality Date   ADENOIDECTOMY     APPENDECTOMY     BREAST BIOPSY Left    benign   BREAST LUMPECTOMY Right 2018   BUNIONECTOMY Right    CARDIAC CATHETERIZATION     CATARACT EXTRACTION, BILATERAL     CHOLECYSTECTOMY     colon adhesion     COLONOSCOPY WITH PROPOFOL N/A 02/02/2018   Dr. Oneida Alar: multiple small and large-mouthed diverticula in recto-sigmoid colon, sigmoid, and descending. TI normal. Internal hemorrhoids during retroflexion, small. Moderate external hemorrhoids   Full mastectomy Right    MASTECTOMY, PARTIAL Right    PROLAPSED UTERINE FIBROID LIGATION     TOE SURGERY Left    TONSILLECTOMY     TOTAL KNEE ARTHROPLASTY Right 05/30/2022   Procedure: TOTAL KNEE ARTHROPLASTY;  Surgeon: Earlie Server, MD;  Location: [facility];  Service: Orthopedics;  Laterality: Right;   TUBAL LIGATION     VAGINAL HYSTERECTOMY     VESICOVAGINAL FISTULA CLOSURE     Patient Active Problem List   Diagnosis Date Noted   S/P TKR (total knee replacement), right 05/30/2022   S/P total knee replacement, right 05/30/2022   GERD (gastroesophageal reflux disease)  02/22/2018   Heme positive stool 11/26/2017   Irritable bowel syndrome with both constipation and diarrhea 09/25/2017   Left lower quadrant abdominal tenderness without rebound tenderness 09/17/2017   History of breast cancer 09/17/2017   Vaginal atrophy 09/17/2017   Vaginal discharge 09/17/2017   BV (bacterial vaginosis) 09/17/2017   Ductal carcinoma of breast, estrogen receptor positive, stage 1 ([place]) 08/18/2017    PCP: Celene Squibb MD  REFERRING PROVIDER: Earlie Server, MD   REFERRING DIAG: s/p rt TKR per Earlie Server, MD DOS 05/30/2022  Next scheduled apt 07/18/22.  THERAPY DIAG:  Right knee pain, unspecified chronicity  Stiffness of right knee, not elsewhere classified  Muscle weakness (generalized)  Other abnormalities of gait and mobility  Other symptoms and signs involving the musculoskeletal system  Rationale for Evaluation and Treatment Rehabilitation  ONSET DATE: 05/30/22  SUBJECTIVE:   SUBJECTIVE STATEMENT: Pt states that she is still sore from Tuesday.  She has A-fib and has been having dizzy spells and SOB this morning; she has not contacted her MD.  The Pt has not been using her cane but felt that she needed it today.  Therapist encouraged pt to contact her primary MD if sx continue.  PERTINENT HISTORY: Hx breast  cancer, HLD, GERD, IBS, depression  PAIN:  Are you having pain? Yes: NPRS scale: current 0/10 Pain location: R knee  Pain description: sore, tight Aggravating factors: lifting, bending Relieving factors: rest, meds   LIVING ENVIRONMENT: Lives with: lives with their spouse Lives in: House/apartment Stairs: Yes: External: 5 steps; on right going up Has following equipment at home: Single point cane and Walker - 2 wheeled   PATIENT GOALS get her mobility back   OBJECTIVE:   PATIENT SURVEYS:  FOTO 39% function   LOWER EXTREMITY ROM:  Active ROM Right eval Left eval Right 06/19/22 Right  8/10  Hip flexion      Hip extension       Hip abduction      Hip adduction      Hip internal rotation      Hip external rotation      Knee flexion 98 123 106 AAROM 108 114  Knee extension Lacking 4 0 Lacking 3 2  Ankle dorsiflexion      Ankle plantarflexion      Ankle inversion      Ankle eversion       (Blank rows = not tested)  LOWER EXTREMITY MMT:  MMT Right eval Left eval  Hip flexion 4- 5  Hip extension    Hip abduction    Hip adduction    Hip internal rotation    Hip external rotation    Knee flexion 4+ 5  Knee extension 4 * 5  Ankle dorsiflexion 5 5  Ankle plantarflexion    Ankle inversion    Ankle eversion     (Blank rows = not tested) *= pain    FUNCTIONAL TESTS:  2 minute walk test: 200 feet Transfers: RLE extended, relies L and UE support  GAIT: Distance walked: 200 feet Assistive device utilized: Walker - 2 wheeled Level of assistance: Modified independence Comments: 2 MWT; slightly antalgic with RW, decreased R knee flexion/extension    TODAY'S TREATMENT:              06/26/22              Bike full rotation x 5 min 12 to 11              Sitting              LAQ x 10-3#              Supine:              Active hamstring stretch x  3 ; 20"              SAQ 3# x 10; SAQ with ER x 10 no wt               Quad set x 10               Heel slide x 10               Bridge x 10               Prone:                Quad stretch x 10              Ham curl with 3# x 10                06/24/22 Bike full revoluation x 5 min seat 14 to 12 2MWT 341f no AD (  noted Trendelenburg Lt LE) Rockerboard DF/PF and lateral movements 48mn each with HHA Knee drive on 109WJstep 5x 10" Hamstring stretch 3x 30" on 12in step TKE 10x 5" with RTB  Sidelying: clam 10x 5"  Supine: quad sets 10x5"  SAQ 10x 5"  Heel slides 10 AROM 3-106 degrees, AAROM 3-116 with rope     06/19/22 Bike full revolutions 5 minutes seat 14 to 12 Heel slides with strap 10 x 10 second holds RLE SLR with quad set 2x 10  RLE Bridge 2x 10  Gait with SPC 200 feet HR 2x 10  TR 2x 10  Standing hip abduction 2x 10 bilateral   PATIENT EDUCATION:  Education details: 06/19/22: HEP; EVAL: Patient educated on exam findings, POC, scope of PT, HEP, and knee positioning at rest. Person educated: Patient Education method: Explanation, Demonstration, and Handouts Education comprehension: verbalized understanding, returned demonstration, verbal cues required, and tactile cues required   HOME EXERCISE PROGRAM: Access Code: [email or web address] URL: [email or web address] 06/19/22 - Heel Raises with Counter Support  - 1 x daily - 7 x weekly - 2 sets - 10 reps - Toe Raises with Counter Support  - 1 x daily - 7 x weekly - 2 sets - 10 reps - Standing Hip Abduction with Counter Support  - 1 x daily - 7 x weekly - 2 sets - 10 reps  Date: 06/17/2022 - Active Straight Leg Raise with Quad Set  - 3 x daily - 7 x weekly - 2 sets - 10 reps - Supine Heel Slide with Strap  - 3 x daily - 7 x weekly - 10 reps - 5-10 second hold  06/24/22: clam BLE  ASSESSMENT:  CLINICAL IMPRESSION: PT with dizziness and SOB this morning but states she is not experiencing these sx at this time.  PT changed treatment to mat based exercises due to pt sx.  Added exercises to promote improved ROM.    OBJECTIVE IMPAIRMENTS Abnormal gait, decreased activity tolerance, decreased balance, decreased endurance, decreased mobility, difficulty walking, decreased ROM, decreased strength, increased muscle spasms, impaired flexibility, improper body mechanics, and pain.   ACTIVITY LIMITATIONS lifting, bending, standing, squatting, sleeping, stairs, transfers, bathing, locomotion level, and caring for others  PARTICIPATION LIMITATIONS: meal prep, cleaning, laundry, shopping, community activity, and yard work  PERSONAL FACTORS Time since onset of injury/illness/exacerbation and 3+ comorbidities: : Anxiety , Arthritis, Back pain, hx Cancer, Depression,GERD  are also  affecting patient's functional outcome.   REHAB POTENTIAL: Good  CLINICAL DECISION MAKING: Stable/uncomplicated  EVALUATION COMPLEXITY: Low   GOALS: Goals reviewed with patient? Yes  SHORT TERM GOALS: Target date: 07/08/2022  Patient will be independent with HEP in order to improve functional outcomes. Baseline:  Goal status: IN PROGRESS  2.  Patient will report at least 25% improvement in symptoms for improved quality of life. Baseline:  Goal status: IN PROGRESS  3.  Patient will be able to transfer to standing/sitting with equal weightbearing without UE support to demonstrate improved functional strength. Baseline: relies LLE with UE use Goal status: IN PROGRESS    LONG TERM GOALS: Target date: 07/29/2022  Patient will report at least 75% improvement in symptoms for improved quality of life. Baseline:  Goal status: IN PROGRESS  2.  Patient will improve FOTO score by at least 23 points in order to indicate improved tolerance to activity. Baseline: 39% function Goal status: IN PROGRESS  3.  Patient will be able to navigate stairs with reciprocal pattern without compensation in order to demonstrate improved  LE strength. Baseline:  Goal status: IN PROGRESS  4.  Patient will be able to ambulate at least 425 feet in 2MWT in order to demonstrate improved tolerance to activity. Baseline: 200 feet with RW Goal status: IN PROGRESS  5.  Patient will improve ROM for R knee extension/flexion to 0-120 degrees to improve squatting, and other functional mobility. Baseline: lacking 4 to 98 Goal status: IN PROGRESS     PLAN: PT FREQUENCY: 2x/week  PT DURATION: 6 weeks  PLANNED INTERVENTIONS: Therapeutic exercises, Therapeutic activity, Neuromuscular re-education, Balance training, Gait training, Patient/Family education, Joint manipulation, Joint mobilization, Stair training, Orthotic/Fit training, DME instructions, Aquatic Therapy, Dry Needling, Electrical stimulation, Spinal  manipulation, Spinal mobilization, Cryotherapy, Moist heat, Compression bandaging, scar mobilization, Splintting, Taping, Traction, Ultrasound, Ionotophoresis '4mg'$ /ml Dexamethasone, and Manual therapy   PLAN FOR NEXT SESSION:  Return to WB exercises if pt is not having any A-fib sx.  R knee mobility, quad and gluteal strength, functional strength and progress all as able  Rayetta Humphrey, PT CLT [phone] 06/26/2022, 9:00 AM

## 2022-07-01 ENCOUNTER — Ambulatory Visit (HOSPITAL_COMMUNITY): Payer: Medicare Other

## 2022-07-03 ENCOUNTER — Ambulatory Visit (HOSPITAL_COMMUNITY): Payer: Medicare Other

## 2022-07-03 ENCOUNTER — Encounter (HOSPITAL_COMMUNITY): Payer: Self-pay

## 2022-07-03 DIAGNOSIS — M25561 Pain in right knee: Secondary | ICD-10-CM | POA: Diagnosis not present

## 2022-07-03 DIAGNOSIS — M25661 Stiffness of right knee, not elsewhere classified: Secondary | ICD-10-CM | POA: Diagnosis not present

## 2022-07-03 DIAGNOSIS — R2689 Other abnormalities of gait and mobility: Secondary | ICD-10-CM

## 2022-07-03 DIAGNOSIS — M6281 Muscle weakness (generalized): Secondary | ICD-10-CM | POA: Diagnosis not present

## 2022-07-03 DIAGNOSIS — R29898 Other symptoms and signs involving the musculoskeletal system: Secondary | ICD-10-CM

## 2022-07-03 NOTE — Therapy (Signed)
OUTPATIENT PHYSICAL THERAPY TREATMENT   Patient Name: Kathy Evans MRN: [id number] DOB:03-Aug-1951, 71 y.o., female Today's Date: 07/03/2022  END OF SESSION:   PT End of Session - 07/03/22 0741     Visit Number 5    Number of Visits 12    Date for PT Re-Evaluation 07/29/22    Authorization Type [employer] Medicare    Progress Note Due on Visit 10    PT Start Time 0737    PT Stop Time 0823    PT Time Calculation (min) 46 min    Activity Tolerance Patient tolerated treatment well    Behavior During Therapy [facility] for tasks assessed/performed              Past Medical History:  Diagnosis Date   Arthritis    Atrial flutter ([place])    Diagnosed October 2021   Breast cancer ([place]) 03/2017   Right   Bronchitis    Depression    GERD (gastroesophageal reflux disease)    Heart murmur    Hyperlipidemia    IBS (irritable bowel syndrome)    Impingement syndrome of left shoulder    Personal history of radiation therapy 2018   Past Surgical History:  Procedure Laterality Date   ADENOIDECTOMY     APPENDECTOMY     BREAST BIOPSY Left    benign   BREAST LUMPECTOMY Right 2018   BUNIONECTOMY Right    CARDIAC CATHETERIZATION     CATARACT EXTRACTION, BILATERAL     CHOLECYSTECTOMY     colon adhesion     COLONOSCOPY WITH PROPOFOL N/A 02/02/2018   Dr. Oneida Alar: multiple small and large-mouthed diverticula in recto-sigmoid colon, sigmoid, and descending. TI normal. Internal hemorrhoids during retroflexion, small. Moderate external hemorrhoids   Full mastectomy Right    MASTECTOMY, PARTIAL Right    PROLAPSED UTERINE FIBROID LIGATION     TOE SURGERY Left    TONSILLECTOMY     TOTAL KNEE ARTHROPLASTY Right 05/30/2022   Procedure: TOTAL KNEE ARTHROPLASTY;  Surgeon: Earlie Server, MD;  Location: [facility];  Service: Orthopedics;  Laterality: Right;   TUBAL LIGATION     VAGINAL HYSTERECTOMY     VESICOVAGINAL FISTULA CLOSURE     Patient Active Problem List   Diagnosis Date Noted   S/P TKR (total  knee replacement), right 05/30/2022   S/P total knee replacement, right 05/30/2022   GERD (gastroesophageal reflux disease) 02/22/2018   Heme positive stool 11/26/2017   Irritable bowel syndrome with both constipation and diarrhea 09/25/2017   Left lower quadrant abdominal tenderness without rebound tenderness 09/17/2017   History of breast cancer 09/17/2017   Vaginal atrophy 09/17/2017   Vaginal discharge 09/17/2017   BV (bacterial vaginosis) 09/17/2017   Ductal carcinoma of breast, estrogen receptor positive, stage 1 ([place]) 08/18/2017    PCP: Celene Squibb MD  REFERRING PROVIDER: Earlie Server, MD   REFERRING DIAG: s/p rt TKR per Earlie Server, MD DOS 05/30/2022  Next scheduled apt 07/18/22.  THERAPY DIAG:  Right knee pain, unspecified chronicity  Stiffness of right knee, not elsewhere classified  Muscle weakness (generalized)  Other abnormalities of gait and mobility  Other symptoms and signs involving the musculoskeletal system  Rationale for Evaluation and Treatment Rehabilitation  ONSET DATE: 05/30/22  SUBJECTIVE:   SUBJECTIVE STATEMENT: Reports she has been symptom free for almost a week, stated she stopped taking Tylenol PM.  No reports of pain currently.  Exercises going well at home.  Reports Lt hip has intermittent pain, especially at night as  she is rolling over.  Stated has bothered her for probably a month prior TKE.  Reports she has began doing more at home.  Able to do laundry, cooking with seated rest breaks, showering and dressing without assistance.    PERTINENT HISTORY: Hx breast cancer, HLD, GERD, IBS, depression  PAIN:  Are you having pain? Yes: NPRS scale: current 0/10 Pain location: R knee  Pain description: sore, tight Aggravating factors: lifting, bending Relieving factors: rest, meds   LIVING ENVIRONMENT: Lives with: lives with their spouse Lives in: House/apartment Stairs: Yes: External: 5 steps; on right going up Has following equipment  at home: Single point cane and Walker - 2 wheeled   PATIENT GOALS get her mobility back   OBJECTIVE:   PATIENT SURVEYS:  FOTO 39% function   LOWER EXTREMITY ROM:  Active ROM Right eval Left eval Right 06/19/22 Right  8/10 Right 07/03/22  Hip flexion       Hip extension       Hip abduction       Hip adduction       Hip internal rotation       Hip external rotation       Knee flexion 98 123 106 AAROM 108 114 123  Knee extension Lacking 4 0 Lacking '3 2 2  '$ Ankle dorsiflexion       Ankle plantarflexion       Ankle inversion       Ankle eversion        (Blank rows = not tested)  LOWER EXTREMITY MMT:  MMT Right eval Left eval  Hip flexion 4- 5  Hip extension    Hip abduction    Hip adduction    Hip internal rotation    Hip external rotation    Knee flexion 4+ 5  Knee extension 4 * 5  Ankle dorsiflexion 5 5  Ankle plantarflexion    Ankle inversion    Ankle eversion     (Blank rows = not tested) *= pain    FUNCTIONAL TESTS:  2 minute walk test: 200 feet Transfers: RLE extended, relies L and UE support  GAIT: Distance walked: 200 feet Assistive device utilized: Walker - 2 wheeled Level of assistance: Modified independence Comments: 2 MWT; slightly antalgic with RW, decreased R knee flexion/extension    TODAY'S TREATMENT:              06/1722:  Bike full rotation x 5 min L3 seat 10 Knee drive on 35DH step 5x 10" Hamstring stretch 3x 30" on 12in step Heel/toe raises 15x5" TKE 10x 5" with RTB Hip abduction 10x Sidestep down blue line (RTB around thigh) 2RT Controlled STS 10x  Prone: quad stretch with rope 2x 30"  Hamstring cult 3# 10x Supine: AROM 2-123 degrees Shown seated piriformis stretch   06/26/22              Bike full rotation x 5 min 12 to 11              Sitting              LAQ x 10-3#              Supine:              Active hamstring stretch x  3 ; 20"              SAQ 3# x 10; SAQ with ER x 10 no wt  Quad set x 10                Heel slide x 10               Bridge x 10               Prone:                Quad stretch x 10              Ham curl with 3# x 10                06/24/22 Bike full revoluation x 5 min seat 14 to 12 2MWT 311f no AD (noted Trendelenburg Lt LE) Rockerboard DF/PF and lateral movements 229m each with HHA Knee drive on 1208MVtep 5x 10" Hamstring stretch 3x 30" on 12in step TKE 10x 5" with RTB  Sidelying: clam 10x 5"  Supine: quad sets 10x5"  SAQ 10x 5"  Heel slides 10 AROM 3-106 degrees, AAROM 3-116 with rope     06/19/22 Bike full revolutions 5 minutes seat 14 to 12 Heel slides with strap 10 x 10 second holds RLE SLR with quad set 2x 10 RLE Bridge 2x 10  Gait with SPC 200 feet HR 2x 10  TR 2x 10  Standing hip abduction 2x 10 bilateral   PATIENT EDUCATION:  Education details: 06/19/22: HEP; EVAL: Patient educated on exam findings, POC, scope of PT, HEP, and knee positioning at rest. Person educated: Patient Education method: Explanation, Demonstration, and Handouts Education comprehension: verbalized understanding, returned demonstration, verbal cues required, and tactile cues required   HOME EXERCISE PROGRAM: Access Code: [email or web address] URL: [email or web address] 07/03/22: sidestep with theraband  06/19/22 - Heel Raises with Counter Support  - 1 x daily - 7 x weekly - 2 sets - 10 reps - Toe Raises with Counter Support  - 1 x daily - 7 x weekly - 2 sets - 10 reps - Standing Hip Abduction with Counter Support  - 1 x daily - 7 x weekly - 2 sets - 10 reps  Date: 06/17/2022 - Active Straight Leg Raise with Quad Set  - 3 x daily - 7 x weekly - 2 sets - 10 reps - Supine Heel Slide with Strap  - 3 x daily - 7 x weekly - 10 reps - 5-10 second hold  06/24/22: clam BLE  ASSESSMENT:  CLINICAL IMPRESSION: Resumed standing exercises with good tolerance.  Noted some [place] with gait, added hip strengthening exercises to POC which was tolerated well.  Most  difficulty with Lt>Rt hip strengthening exercises due to weakness and reports of hip "catching".  Pt presents with improved AROM 3-123 degrees with ease.  Next MD apt 07/18/22, pt stated she feels ready for DC following 3 more apts due to financial concerns.  Plan to progress functional strengthening next session.    OBJECTIVE IMPAIRMENTS Abnormal gait, decreased activity tolerance, decreased balance, decreased endurance, decreased mobility, difficulty walking, decreased ROM, decreased strength, increased muscle spasms, impaired flexibility, improper body mechanics, and pain.   ACTIVITY LIMITATIONS lifting, bending, standing, squatting, sleeping, stairs, transfers, bathing, locomotion level, and caring for others  PARTICIPATION LIMITATIONS: meal prep, cleaning, laundry, shopping, community activity, and yard work  PERSONAL FACTORS Time since onset of injury/illness/exacerbation and 3+ comorbidities: : Anxiety , Arthritis, Back pain, hx Cancer, Depression,GERD  are also affecting patient's functional outcome.   REHAB POTENTIAL: Good  CLINICAL DECISION MAKING: Stable/uncomplicated  EVALUATION COMPLEXITY: Low  GOALS: Goals reviewed with patient? Yes  SHORT TERM GOALS: Target date: 07/08/2022  Patient will be independent with HEP in order to improve functional outcomes. Baseline:  Goal status: IN PROGRESS  2.  Patient will report at least 25% improvement in symptoms for improved quality of life. Baseline:  Goal status: IN PROGRESS  3.  Patient will be able to transfer to standing/sitting with equal weightbearing without UE support to demonstrate improved functional strength. Baseline: relies LLE with UE use Goal status: IN PROGRESS    LONG TERM GOALS: Target date: 07/29/2022  Patient will report at least 75% improvement in symptoms for improved quality of life. Baseline:  Goal status: IN PROGRESS  2.  Patient will improve FOTO score by at least 23 points in order to indicate  improved tolerance to activity. Baseline: 39% function Goal status: IN PROGRESS  3.  Patient will be able to navigate stairs with reciprocal pattern without compensation in order to demonstrate improved LE strength. Baseline:  Goal status: IN PROGRESS  4.  Patient will be able to ambulate at least 425 feet in 2MWT in order to demonstrate improved tolerance to activity. Baseline: 200 feet with RW Goal status: IN PROGRESS  5.  Patient will improve ROM for R knee extension/flexion to 0-120 degrees to improve squatting, and other functional mobility. Baseline: lacking 4 to 98 Goal status: IN PROGRESS     PLAN: PT FREQUENCY: 2x/week  PT DURATION: 6 weeks  PLANNED INTERVENTIONS: Therapeutic exercises, Therapeutic activity, Neuromuscular re-education, Balance training, Gait training, Patient/Family education, Joint manipulation, Joint mobilization, Stair training, Orthotic/Fit training, DME instructions, Aquatic Therapy, Dry Needling, Electrical stimulation, Spinal manipulation, Spinal mobilization, Cryotherapy, Moist heat, Compression bandaging, scar mobilization, Splintting, Taping, Traction, Ultrasound, Ionotophoresis '4mg'$ /ml Dexamethasone, and Manual therapy   PLAN FOR NEXT SESSION:  Next session progress functional strengthening with additional step training, squats and balance activities.  Build HEP.  Review goals prior MD apt on 07/18/22.  R knee mobility, quad and gluteal strength, functional strength and progress all as able  Ihor Austin, LPTA/CLT; Delana Meyer [phone]  07/03/2022

## 2022-07-07 ENCOUNTER — Encounter (HOSPITAL_COMMUNITY): Payer: Medicare Other | Admitting: Physical Therapy

## 2022-07-09 ENCOUNTER — Ambulatory Visit (HOSPITAL_COMMUNITY): Payer: Medicare Other | Admitting: Physical Therapy

## 2022-07-09 ENCOUNTER — Encounter (HOSPITAL_COMMUNITY): Payer: Self-pay | Admitting: Physical Therapy

## 2022-07-09 DIAGNOSIS — M25661 Stiffness of right knee, not elsewhere classified: Secondary | ICD-10-CM

## 2022-07-09 DIAGNOSIS — M6281 Muscle weakness (generalized): Secondary | ICD-10-CM | POA: Diagnosis not present

## 2022-07-09 DIAGNOSIS — M25561 Pain in right knee: Secondary | ICD-10-CM | POA: Diagnosis not present

## 2022-07-09 DIAGNOSIS — R2689 Other abnormalities of gait and mobility: Secondary | ICD-10-CM | POA: Diagnosis not present

## 2022-07-09 DIAGNOSIS — R29898 Other symptoms and signs involving the musculoskeletal system: Secondary | ICD-10-CM | POA: Diagnosis not present

## 2022-07-09 NOTE — Therapy (Signed)
OUTPATIENT PHYSICAL THERAPY TREATMENT   Patient Name: Kathy Evans MRN: [id number] DOB:12/24/1950, 71 y.o., female Today's Date: 07/09/2022  END OF SESSION:   PT End of Session - 07/09/22 0947     Visit Number 6    Number of Visits 12    Date for PT Re-Evaluation 07/29/22    Authorization Type [employer] Medicare    Progress Note Due on Visit 10    PT Start Time [phone]    PT Stop Time 1024    PT Time Calculation (min) 38 min    Activity Tolerance Patient tolerated treatment well    Behavior During Therapy [facility] for tasks assessed/performed              Past Medical History:  Diagnosis Date   Arthritis    Atrial flutter ([place])    Diagnosed October 2021   Breast cancer ([place]) 03/2017   Right   Bronchitis    Depression    GERD (gastroesophageal reflux disease)    Heart murmur    Hyperlipidemia    IBS (irritable bowel syndrome)    Impingement syndrome of left shoulder    Personal history of radiation therapy 2018   Past Surgical History:  Procedure Laterality Date   ADENOIDECTOMY     APPENDECTOMY     BREAST BIOPSY Left    benign   BREAST LUMPECTOMY Right 2018   BUNIONECTOMY Right    CARDIAC CATHETERIZATION     CATARACT EXTRACTION, BILATERAL     CHOLECYSTECTOMY     colon adhesion     COLONOSCOPY WITH PROPOFOL N/A 02/02/2018   Dr. Oneida Alar: multiple small and large-mouthed diverticula in recto-sigmoid colon, sigmoid, and descending. TI normal. Internal hemorrhoids during retroflexion, small. Moderate external hemorrhoids   Full mastectomy Right    MASTECTOMY, PARTIAL Right    PROLAPSED UTERINE FIBROID LIGATION     TOE SURGERY Left    TONSILLECTOMY     TOTAL KNEE ARTHROPLASTY Right 05/30/2022   Procedure: TOTAL KNEE ARTHROPLASTY;  Surgeon: Earlie Server, MD;  Location: [facility];  Service: Orthopedics;  Laterality: Right;   TUBAL LIGATION     VAGINAL HYSTERECTOMY     VESICOVAGINAL FISTULA CLOSURE     Patient Active Problem List   Diagnosis Date Noted   S/P TKR (total  knee replacement), right 05/30/2022   S/P total knee replacement, right 05/30/2022   GERD (gastroesophageal reflux disease) 02/22/2018   Heme positive stool 11/26/2017   Irritable bowel syndrome with both constipation and diarrhea 09/25/2017   Left lower quadrant abdominal tenderness without rebound tenderness 09/17/2017   History of breast cancer 09/17/2017   Vaginal atrophy 09/17/2017   Vaginal discharge 09/17/2017   BV (bacterial vaginosis) 09/17/2017   Ductal carcinoma of breast, estrogen receptor positive, stage 1 ([place]) 08/18/2017    PCP: Celene Squibb MD  REFERRING PROVIDER: Earlie Server, MD   REFERRING DIAG: s/p rt TKR per Earlie Server, MD DOS 05/30/2022  Next scheduled apt 07/18/22.  THERAPY DIAG:  Right knee pain, unspecified chronicity  Stiffness of right knee, not elsewhere classified  Muscle weakness (generalized)  Other abnormalities of gait and mobility  Other symptoms and signs involving the musculoskeletal system  Rationale for Evaluation and Treatment Rehabilitation  ONSET DATE: 05/30/22  SUBJECTIVE:   SUBJECTIVE STATEMENT: Patient states he hurt her knee when trying to rotate mattress over the weekend. Was feeling great up until then and was walking great.   PERTINENT HISTORY: Hx breast cancer, HLD, GERD, IBS, depression  PAIN:  Are  you having pain? Yes: NPRS scale: current 2/10 Pain location: R knee  Pain description: sore, tight Aggravating factors: lifting, bending Relieving factors: rest, meds   LIVING ENVIRONMENT: Lives with: lives with their spouse Lives in: House/apartment Stairs: Yes: External: 5 steps; on right going up Has following equipment at home: Single point cane and Walker - 2 wheeled   PATIENT GOALS get her mobility back   OBJECTIVE:   PATIENT SURVEYS:  FOTO 39% function   LOWER EXTREMITY ROM:  Active ROM Right eval Left eval Right 06/19/22 Right  8/10 Right 07/03/22 Right 07/09/22  Hip flexion        Hip  extension        Hip abduction        Hip adduction        Hip internal rotation        Hip external rotation        Knee flexion 98 123 106 AAROM 108 114 123 117  Knee extension Lacking 4 0 Lacking '3 2 2 '$ 0  Ankle dorsiflexion        Ankle plantarflexion        Ankle inversion        Ankle eversion         (Blank rows = not tested)  LOWER EXTREMITY MMT:  MMT Right eval Left eval  Hip flexion 4- 5  Hip extension    Hip abduction    Hip adduction    Hip internal rotation    Hip external rotation    Knee flexion 4+ 5  Knee extension 4 * 5  Ankle dorsiflexion 5 5  Ankle plantarflexion    Ankle inversion    Ankle eversion     (Blank rows = not tested) *= pain    FUNCTIONAL TESTS:  2 minute walk test: 200 feet Transfers: RLE extended, relies L and UE support  GAIT: Distance walked: 200 feet Assistive device utilized: Walker - 2 wheeled Level of assistance: Modified independence Comments: 2 MWT; slightly antalgic with RW, decreased R knee flexion/extension    TODAY'S TREATMENT:\ 07/09/22 Palpation/joint mobility: patient TTP at R MCL and LCL but varus/valgus tests intact without laxity Bike full rotation x 5 min L3 seat 10 HR/TR 1x 15 Step up 4 inch 2x 10  Lateral step up 4 inch 2x 10 STS 1x 10  TKE 10 x 5 second holds 3 plates            04/8087:  Bike full rotation x 5 min L3 seat 10 Knee drive on 11SR step 5x 10" Hamstring stretch 3x 30" on 12in step Heel/toe raises 15x5" TKE 10x 5" with RTB Hip abduction 10x Sidestep down blue line (RTB around thigh) 2RT Controlled STS 10x  Prone: quad stretch with rope 2x 30"  Hamstring cult 3# 10x Supine: AROM 2-123 degrees Shown seated piriformis stretch   06/26/22              Bike full rotation x 5 min 12 to 11              Sitting              LAQ x 10-3#              Supine:              Active hamstring stretch x  3 ; 20"              SAQ 3# x 10; SAQ with  ER x 10 no wt               Quad set x 10                Heel slide x 10               Bridge x 10               Prone:                Quad stretch x 10              Ham curl with 3# x 10     PATIENT EDUCATION:  Education details: 06/19/22: HEP; EVAL: Patient educated on exam findings, POC, scope of PT, HEP, and knee positioning at rest. Person educated: Patient Education method: Explanation, Demonstration, and Handouts Education comprehension: verbalized understanding, returned demonstration, verbal cues required, and tactile cues required   HOME EXERCISE PROGRAM: Access Code: [email or web address] URL: [email or web address] 8/23- Sit to Stand with Arms Crossed  - 1 x daily - 7 x weekly - 2 sets - 10 reps  07/03/22: sidestep with theraband  06/19/22 - Heel Raises with Counter Support  - 1 x daily - 7 x weekly - 2 sets - 10 reps - Toe Raises with Counter Support  - 1 x daily - 7 x weekly - 2 sets - 10 reps - Standing Hip Abduction with Counter Support  - 1 x daily - 7 x weekly - 2 sets - 10 reps  Date: 06/17/2022 - Active Straight Leg Raise with Quad Set  - 3 x daily - 7 x weekly - 2 sets - 10 reps - Supine Heel Slide with Strap  - 3 x daily - 7 x weekly - 10 reps - 5-10 second hold  06/24/22: clam BLE  ASSESSMENT:  CLINICAL IMPRESSION: Patient with injury/increased symptoms following chores at home over the weekend. Patient TTP to R MCL/LCL, vargus/valgus testing for ligaments WFL without laxity. Began with bike for dynamic warm up and ROM and initially with difficulty completing full revolution but able after a minute once stiffness decreases. Patient ambulating with improving gait mechanics following bike compared to entrance for session. Began stair training and patient completes on 4 inch step with unilateral HHA for balance/strength deficit. Patient will continue to benefit from physical therapy in order to improve function and reduce impairment.    OBJECTIVE IMPAIRMENTS Abnormal gait, decreased activity tolerance, decreased  balance, decreased endurance, decreased mobility, difficulty walking, decreased ROM, decreased strength, increased muscle spasms, impaired flexibility, improper body mechanics, and pain.   ACTIVITY LIMITATIONS lifting, bending, standing, squatting, sleeping, stairs, transfers, bathing, locomotion level, and caring for others  PARTICIPATION LIMITATIONS: meal prep, cleaning, laundry, shopping, community activity, and yard work  PERSONAL FACTORS Time since onset of injury/illness/exacerbation and 3+ comorbidities: : Anxiety , Arthritis, Back pain, hx Cancer, Depression,GERD  are also affecting patient's functional outcome.   REHAB POTENTIAL: Good  CLINICAL DECISION MAKING: Stable/uncomplicated  EVALUATION COMPLEXITY: Low   GOALS: Goals reviewed with patient? Yes  SHORT TERM GOALS: Target date: 07/08/2022  Patient will be independent with HEP in order to improve functional outcomes. Baseline:  Goal status: IN PROGRESS  2.  Patient will report at least 25% improvement in symptoms for improved quality of life. Baseline:  Goal status: IN PROGRESS  3.  Patient will be able to transfer to standing/sitting with equal weightbearing without UE support to demonstrate improved functional strength. Baseline: relies LLE  with UE use Goal status: IN PROGRESS    LONG TERM GOALS: Target date: 07/29/2022  Patient will report at least 75% improvement in symptoms for improved quality of life. Baseline:  Goal status: IN PROGRESS  2.  Patient will improve FOTO score by at least 23 points in order to indicate improved tolerance to activity. Baseline: 39% function Goal status: IN PROGRESS  3.  Patient will be able to navigate stairs with reciprocal pattern without compensation in order to demonstrate improved LE strength. Baseline:  Goal status: IN PROGRESS  4.  Patient will be able to ambulate at least 425 feet in 2MWT in order to demonstrate improved tolerance to activity. Baseline: 200 feet  with RW Goal status: IN PROGRESS  5.  Patient will improve ROM for R knee extension/flexion to 0-120 degrees to improve squatting, and other functional mobility. Baseline: lacking 4 to 98 Goal status: IN PROGRESS     PLAN: PT FREQUENCY: 2x/week  PT DURATION: 6 weeks  PLANNED INTERVENTIONS: Therapeutic exercises, Therapeutic activity, Neuromuscular re-education, Balance training, Gait training, Patient/Family education, Joint manipulation, Joint mobilization, Stair training, Orthotic/Fit training, DME instructions, Aquatic Therapy, Dry Needling, Electrical stimulation, Spinal manipulation, Spinal mobilization, Cryotherapy, Moist heat, Compression bandaging, scar mobilization, Splintting, Taping, Traction, Ultrasound, Ionotophoresis '4mg'$ /ml Dexamethasone, and Manual therapy   PLAN FOR NEXT SESSION:  Next session progress functional strengthening with additional step training, squats and balance activities.  Build HEP.  Review goals prior MD apt on 07/18/22.  R knee mobility, quad and gluteal strength, functional strength and progress all as able  9:47 AM, 07/09/22 Mearl Latin PT, DPT Physical Therapist at [facility]

## 2022-07-15 ENCOUNTER — Ambulatory Visit (HOSPITAL_COMMUNITY): Payer: Medicare Other

## 2022-07-15 ENCOUNTER — Encounter (HOSPITAL_COMMUNITY): Payer: Self-pay

## 2022-07-15 DIAGNOSIS — R2689 Other abnormalities of gait and mobility: Secondary | ICD-10-CM

## 2022-07-15 DIAGNOSIS — M25561 Pain in right knee: Secondary | ICD-10-CM | POA: Diagnosis not present

## 2022-07-15 DIAGNOSIS — R29898 Other symptoms and signs involving the musculoskeletal system: Secondary | ICD-10-CM | POA: Diagnosis not present

## 2022-07-15 DIAGNOSIS — M6281 Muscle weakness (generalized): Secondary | ICD-10-CM

## 2022-07-15 DIAGNOSIS — M25661 Stiffness of right knee, not elsewhere classified: Secondary | ICD-10-CM

## 2022-07-15 NOTE — Therapy (Signed)
OUTPATIENT PHYSICAL THERAPY TREATMENT  Progress Note Reporting Period 0801/23 to 07/15/22 prior MD apt  See note below for Objective Data and Assessment of Progress/Goals.     Patient Name: Kathy Evans MRN: [id number] DOB:08/11/1951, 71 y.o., female Today's Date: 07/15/2022  END OF SESSION:   PT End of Session - 07/15/22 0950     Visit Number 7    Number of Visits 12    Date for PT Re-Evaluation 07/29/22    Authorization Type [employer]    Progress Note Due on Visit 10    PT Start Time 0902    PT Stop Time 0948    PT Time Calculation (min) 46 min    Activity Tolerance Patient tolerated treatment well    Behavior During Therapy [facility] for tasks assessed/performed               Past Medical History:  Diagnosis Date   Arthritis    Atrial flutter ([place])    Diagnosed October 2021   Breast cancer ([place]) 03/2017   Right   Bronchitis    Depression    GERD (gastroesophageal reflux disease)    Heart murmur    Hyperlipidemia    IBS (irritable bowel syndrome)    Impingement syndrome of left shoulder    Personal history of radiation therapy 2018   Past Surgical History:  Procedure Laterality Date   ADENOIDECTOMY     APPENDECTOMY     BREAST BIOPSY Left    benign   BREAST LUMPECTOMY Right 2018   BUNIONECTOMY Right    CARDIAC CATHETERIZATION     CATARACT EXTRACTION, BILATERAL     CHOLECYSTECTOMY     colon adhesion     COLONOSCOPY WITH PROPOFOL N/A 02/02/2018   Dr. Oneida Alar: multiple small and large-mouthed diverticula in recto-sigmoid colon, sigmoid, and descending. TI normal. Internal hemorrhoids during retroflexion, small. Moderate external hemorrhoids   Full mastectomy Right    MASTECTOMY, PARTIAL Right    PROLAPSED UTERINE FIBROID LIGATION     TOE SURGERY Left    TONSILLECTOMY     TOTAL KNEE ARTHROPLASTY Right 05/30/2022   Procedure: TOTAL KNEE ARTHROPLASTY;  Surgeon: Earlie Server, MD;  Location: [facility];  Service: Orthopedics;  Laterality: Right;   TUBAL  LIGATION     VAGINAL HYSTERECTOMY     VESICOVAGINAL FISTULA CLOSURE     Patient Active Problem List   Diagnosis Date Noted   S/P TKR (total knee replacement), right 05/30/2022   S/P total knee replacement, right 05/30/2022   GERD (gastroesophageal reflux disease) 02/22/2018   Heme positive stool 11/26/2017   Irritable bowel syndrome with both constipation and diarrhea 09/25/2017   Left lower quadrant abdominal tenderness without rebound tenderness 09/17/2017   History of breast cancer 09/17/2017   Vaginal atrophy 09/17/2017   Vaginal discharge 09/17/2017   BV (bacterial vaginosis) 09/17/2017   Ductal carcinoma of breast, estrogen receptor positive, stage 1 ([place]) 08/18/2017    PCP: Celene Squibb MD  REFERRING PROVIDER: Earlie Server, MD   REFERRING DIAG: s/p rt TKR per Earlie Server, MD DOS 05/30/2022  Next scheduled apt 07/18/22.  THERAPY DIAG:  Right knee pain, unspecified chronicity  Stiffness of right knee, not elsewhere classified  Muscle weakness (generalized)  Other abnormalities of gait and mobility  Other symptoms and signs involving the musculoskeletal system  Rationale for Evaluation and Treatment Rehabilitation  ONSET DATE: 05/30/22  SUBJECTIVE:   SUBJECTIVE STATEMENT: Returns to MD 07/18/22.  Most difficulty sleeping at night and standing for long  periods.  Reports ability to get in and out of shower (with chair) with no assistance for the last couple weeks.   PERTINENT HISTORY: Hx breast cancer, HLD, GERD, IBS, depression  PAIN:  Are you having pain? Yes: NPRS scale: no pain just soreness 0/10 Pain location: R knee  Pain description: sore, tight Aggravating factors: lifting, bending Relieving factors: rest, meds   LIVING ENVIRONMENT: Lives with: lives with their spouse Lives in: House/apartment Stairs: Yes: External: 5 steps; on right going up Has following equipment at home: Single point cane and Walker - 2 wheeled   PATIENT GOALS get her  mobility back   OBJECTIVE:   PATIENT SURVEYS:  FOTO 39% function  50% function   LOWER EXTREMITY ROM:  Active ROM Right eval Left eval Right 06/19/22 Right  8/10 Right 07/03/22 Right 07/09/22 Right 07/15/22  Hip flexion         Hip extension         Hip abduction         Hip adduction         Hip internal rotation         Hip external rotation         Knee flexion 98 123 106 AAROM 108 114 123 117 119  Knee extension Lacking 4 0 Lacking $RemoveB'3 2 2 '[id number]$ 0 0  Ankle dorsiflexion         Ankle plantarflexion         Ankle inversion         Ankle eversion          (Blank rows = not tested)  LOWER EXTREMITY MMT:  MMT Right eval Left eval Right 07/15/22 Left 07/15/22  Hip flexion 4- 5 4/5 5/5  Hip extension   3+ 4/5  Hip abduction   3/5 3+/5 increased hip pain with movement  Hip adduction      Hip internal rotation      Hip external rotation      Knee flexion 4+ 5 5/5 5/5  Knee extension 4 * 5 4/5 5/5  Ankle dorsiflexion 5 5    Ankle plantarflexion      Ankle inversion      Ankle eversion       (Blank rows = not tested) *= pain    FUNCTIONAL TESTS:  2 minute walk test: 200 feet Transfers: RLE extended, relies L and UE support  GAIT: Distance walked: 200 feet Assistive device utilized: Walker - 2 wheeled Level of assistance: Modified independence Comments: 2 MWT; slightly antalgic with RW, decreased R knee flexion/extension    TODAY'S TREATMENT:\ 07/15/22: Bike full rotation x 5 min L3 seat 10 FOTO 50% was 39% 2MWT no AD 404 Stairs: demonstrates step to pattern 7in, able to complete reciprocal pattern following cueing on 4in then 7in step height 3RT total.  Increased difficulty descending stairs.   5STS 16.58" no HHA HR/TR 15 each Lateral step up 4in height 10x  Step down 4in step height Squat front of chair MMT AROM 0-119 SLS Lt 12", Rt 7" Vector stance 3x 5" BLE with 1HHA  07/09/22 Palpation/joint mobility: patient TTP at R MCL and LCL but varus/valgus  tests intact without laxity Bike full rotation x 5 min L3 seat 10 HR/TR 1x 15 Step up 4 inch 2x 10  Lateral step up 4 inch 2x 10 STS 1x 10  TKE 10 x 5 second holds 3 plates            01/5464:  Bike full rotation x 5 min L3 seat 10 Knee drive on 76AU step 5x 10" Hamstring stretch 3x 30" on 12in step Heel/toe raises 15x5" TKE 10x 5" with RTB Hip abduction 10x Sidestep down blue line (RTB around thigh) 2RT Controlled STS 10x  Prone: quad stretch with rope 2x 30"  Hamstring cult 3# 10x Supine: AROM 2-123 degrees Shown seated piriformis stretch   06/26/22              Bike full rotation x 5 min 12 to 11              Sitting              LAQ x 10-3#              Supine:              Active hamstring stretch x  3 ; 20"              SAQ 3# x 10; SAQ with ER x 10 no wt               Quad set x 10               Heel slide x 10               Bridge x 10               Prone:                Quad stretch x 10              Ham curl with 3# x 10     PATIENT EDUCATION:  Education details: 06/19/22: HEP; EVAL: Patient educated on exam findings, POC, scope of PT, HEP, and knee positioning at rest. Person educated: Patient Education method: Explanation, Demonstration, and Handouts Education comprehension: verbalized understanding, returned demonstration, verbal cues required, and tactile cues required   HOME EXERCISE PROGRAM: Access Code: [email or web address] URL: [email or web address] 8/23- Sit to Stand with Arms Crossed  - 1 x daily - 7 x weekly - 2 sets - 10 reps  07/03/22: sidestep with theraband  06/19/22 - Heel Raises with Counter Support  - 1 x daily - 7 x weekly - 2 sets - 10 reps - Toe Raises with Counter Support  - 1 x daily - 7 x weekly - 2 sets - 10 reps - Standing Hip Abduction with Counter Support  - 1 x daily - 7 x weekly - 2 sets - 10 reps  Date: 06/17/2022 - Active Straight Leg Raise with Quad Set  - 3 x daily - 7 x weekly - 2 sets - 10 reps - Supine Heel Slide  with Strap  - 3 x daily - 7 x weekly - 10 reps - 5-10 second hold  06/24/22: clam BLE  ASSESSMENT:  CLINICAL IMPRESSION: Reviewed goals prior MD apt scheduled for 07/18/22.  Pt progressing well and feels she has improved 100% and feels she is ready to progress to HEP following MDs apt.  FOTO complete while on bike with improvements by 11%.  Pt now ambulating without AD, min cueing to improve heel to toe mechanics and reduce hip circumduction to improve gait mechanics.  Pt able to demonstrate reciprocal pattern following cueing for mechanics with handrails assistance, does demonstrate decreased eccentric control descending stairs.  AROM 0-119 degrees.  Pt does continue to present with weakness especially gluteal musculature.  Added balance associated exercises to  HEP for gluteal activation.      OBJECTIVE IMPAIRMENTS Abnormal gait, decreased activity tolerance, decreased balance, decreased endurance, decreased mobility, difficulty walking, decreased ROM, decreased strength, increased muscle spasms, impaired flexibility, improper body mechanics, and pain.   ACTIVITY LIMITATIONS lifting, bending, standing, squatting, sleeping, stairs, transfers, bathing, locomotion level, and caring for others  PARTICIPATION LIMITATIONS: meal prep, cleaning, laundry, shopping, community activity, and yard work  PERSONAL FACTORS Time since onset of injury/illness/exacerbation and 3+ comorbidities: : Anxiety , Arthritis, Back pain, hx Cancer, Depression,GERD  are also affecting patient's functional outcome.   REHAB POTENTIAL: Good  CLINICAL DECISION MAKING: Stable/uncomplicated  EVALUATION COMPLEXITY: Low   GOALS: Goals reviewed with patient? Yes  SHORT TERM GOALS: Target date: 07/08/2022  Patient will be independent with HEP in order to improve functional outcomes. Baseline: 07/15/22:  Reports compliance with HEP daily.   Goal status: MET  2.  Patient will report at least 25% improvement in symptoms for  improved quality of life. Baseline: 07/15/22:  Reports she feels she has improved by 100%, feels ready for DC this week. Goal status: MET  3.  Patient will be able to transfer to standing/sitting with equal weightbearing without UE support to demonstrate improved functional strength. Baseline: relies LLE with UE use; 07/15/22: Able to complete equal STS, 5STS 16.58" no HHA required. Goal status: MET    LONG TERM GOALS: Target date: 07/29/2022  Patient will report at least 75% improvement in symptoms for improved quality of life. Baseline: 07/15/22:  Reports she feels she has improved by 100%, feels ready for DC this week. Goal status: MET  2.  Patient will improve FOTO score by at least 23 points in order to indicate improved tolerance to activity. Baseline: 39% function, 07/15/22:  50% functional Goal status: IN PROGRESS  3.  Patient will be able to navigate stairs with reciprocal pattern without compensation in order to demonstrate improved LE strength. Baseline: 07/15/22:  Demonstrate step to pattern 7in; able to complete reciprocal pattern following cueing on 4in then 7in step height. Goal status: IN PROGRESS  4.  Patient will be able to ambulate at least 425 feet in 2MWT in order to demonstrate improved tolerance to activity. Baseline: 200 feet with RW; 07/15/22: 2MWT 47ft no AD Goal status: IN PROGRESS  5.  Patient will improve ROM for R knee extension/flexion to 0-120 degrees to improve squatting, and other functional mobility. Baseline: lacking 4 to 98; 07/15/22: 0-119 degrees Goal status: IN PROGRESS     PLAN: PT FREQUENCY: 2x/week  PT DURATION: 6 weeks  PLANNED INTERVENTIONS: Therapeutic exercises, Therapeutic activity, Neuromuscular re-education, Balance training, Gait training, Patient/Family education, Joint manipulation, Joint mobilization, Stair training, Orthotic/Fit training, DME instructions, Aquatic Therapy, Dry Needling, Electrical stimulation, Spinal  manipulation, Spinal mobilization, Cryotherapy, Moist heat, Compression bandaging, scar mobilization, Splintting, Taping, Traction, Ultrasound, Ionotophoresis 4mg /ml Dexamethasone, and Manual therapy   PLAN FOR NEXT SESSION:  Next session progress functional strengthening with additional step training, squats and balance activities.  Build HEP. R knee mobility, quad and gluteal strength, functional strength and progress all as able  Ihor Austin, LPTA/CLT; CBIS [phone] 3:02 PM, 07/15/22

## 2022-07-17 ENCOUNTER — Encounter (HOSPITAL_COMMUNITY): Payer: Self-pay

## 2022-07-17 ENCOUNTER — Ambulatory Visit (HOSPITAL_COMMUNITY): Payer: Medicare Other

## 2022-07-17 DIAGNOSIS — M25661 Stiffness of right knee, not elsewhere classified: Secondary | ICD-10-CM | POA: Diagnosis not present

## 2022-07-17 DIAGNOSIS — R2689 Other abnormalities of gait and mobility: Secondary | ICD-10-CM

## 2022-07-17 DIAGNOSIS — M25561 Pain in right knee: Secondary | ICD-10-CM

## 2022-07-17 DIAGNOSIS — R29898 Other symptoms and signs involving the musculoskeletal system: Secondary | ICD-10-CM | POA: Diagnosis not present

## 2022-07-17 DIAGNOSIS — M6281 Muscle weakness (generalized): Secondary | ICD-10-CM

## 2022-07-17 NOTE — Therapy (Addendum)
OUTPATIENT PHYSICAL THERAPY TREATMENT Patient Name: Kathy Evans MRN: [id number] DOB:10/14/1951, 71 y.o., female Today's Date: 07/17/2022  PHYSICAL THERAPY DISCHARGE SUMMARY  Visits from Start of Care: 8  Current functional level related to goals / functional outcomes: See below   Remaining deficits: See below   Education / Equipment: See below   Patient agrees to discharge. Patient goals were met. Patient is being discharged due to being pleased with the current functional level.    END OF SESSION:   PT End of Session - 07/17/22 0905     Visit Number 8    Number of Visits 12    Date for PT Re-Evaluation 07/29/22    Authorization Type [employer]    Progress Note Due on Visit 10    PT Start Time 0818    PT Stop Time 0902    PT Time Calculation (min) 44 min    Activity Tolerance Patient tolerated treatment well    Behavior During Therapy [facility] for tasks assessed/performed                Past Medical History:  Diagnosis Date   Arthritis    Atrial flutter ([place])    Diagnosed October 2021   Breast cancer ([place]) 03/2017   Right   Bronchitis    Depression    GERD (gastroesophageal reflux disease)    Heart murmur    Hyperlipidemia    IBS (irritable bowel syndrome)    Impingement syndrome of left shoulder    Personal history of radiation therapy 2018   Past Surgical History:  Procedure Laterality Date   ADENOIDECTOMY     APPENDECTOMY     BREAST BIOPSY Left    benign   BREAST LUMPECTOMY Right 2018   BUNIONECTOMY Right    CARDIAC CATHETERIZATION     CATARACT EXTRACTION, BILATERAL     CHOLECYSTECTOMY     colon adhesion     COLONOSCOPY WITH PROPOFOL N/A 02/02/2018   Dr. Oneida Alar: multiple small and large-mouthed diverticula in recto-sigmoid colon, sigmoid, and descending. TI normal. Internal hemorrhoids during retroflexion, small. Moderate external hemorrhoids   Full mastectomy Right    MASTECTOMY, PARTIAL Right    PROLAPSED UTERINE FIBROID LIGATION      TOE SURGERY Left    TONSILLECTOMY     TOTAL KNEE ARTHROPLASTY Right 05/30/2022   Procedure: TOTAL KNEE ARTHROPLASTY;  Surgeon: Earlie Server, MD;  Location: [facility];  Service: Orthopedics;  Laterality: Right;   TUBAL LIGATION     VAGINAL HYSTERECTOMY     VESICOVAGINAL FISTULA CLOSURE     Patient Active Problem List   Diagnosis Date Noted   S/P TKR (total knee replacement), right 05/30/2022   S/P total knee replacement, right 05/30/2022   GERD (gastroesophageal reflux disease) 02/22/2018   Heme positive stool 11/26/2017   Irritable bowel syndrome with both constipation and diarrhea 09/25/2017   Left lower quadrant abdominal tenderness without rebound tenderness 09/17/2017   History of breast cancer 09/17/2017   Vaginal atrophy 09/17/2017   Vaginal discharge 09/17/2017   BV (bacterial vaginosis) 09/17/2017   Ductal carcinoma of breast, estrogen receptor positive, stage 1 ([place]) 08/18/2017    PCP: Celene Squibb MD  REFERRING PROVIDER: Earlie Server, MD   REFERRING DIAG: s/p rt TKR per Earlie Server, MD DOS 05/30/2022  Next scheduled apt 07/18/22.  THERAPY DIAG:  Right knee pain, unspecified chronicity  Stiffness of right knee, not elsewhere classified  Muscle weakness (generalized)  Other abnormalities of gait and mobility  Other symptoms  and signs involving the musculoskeletal system  Rationale for Evaluation and Treatment Rehabilitation  ONSET DATE: 05/30/22  SUBJECTIVE:   SUBJECTIVE STATEMENT: Reports she had a good night sleep, woke only to use bathroom  PERTINENT HISTORY: Hx breast cancer, HLD, GERD, IBS, depression  PAIN:  Are you having pain? Yes: NPRS scale: no pain just soreness 0/10 Pain location: R knee  Pain description: sore, tight Aggravating factors: lifting, bending Relieving factors: rest, meds   LIVING ENVIRONMENT: Lives with: lives with their spouse Lives in: House/apartment Stairs: Yes: External: 5 steps; on right going up Has following  equipment at home: Single point cane and Walker - 2 wheeled   PATIENT GOALS get her mobility back   OBJECTIVE:   PATIENT SURVEYS:  FOTO 39% function  50% function   LOWER EXTREMITY ROM:  Active ROM Right eval Left eval Right 06/19/22 Right  8/10 Right 07/03/22 Right 07/09/22 Right 07/15/22  Hip flexion         Hip extension         Hip abduction         Hip adduction         Hip internal rotation         Hip external rotation         Knee flexion 98 123 106 AAROM 108 114 123 117 119  Knee extension Lacking 4 0 Lacking 3 2 2  0 0  Ankle dorsiflexion         Ankle plantarflexion         Ankle inversion         Ankle eversion          (Blank rows = not tested)  LOWER EXTREMITY MMT:  MMT Right eval Left eval Right 07/15/22 Left 07/15/22  Hip flexion 4- 5 4/5 5/5  Hip extension   3+ 4/5  Hip abduction   3/5 3+/5 increased hip pain with movement  Hip adduction      Hip internal rotation      Hip external rotation      Knee flexion 4+ 5 5/5 5/5  Knee extension 4 * 5 4/5 5/5  Ankle dorsiflexion 5 5    Ankle plantarflexion      Ankle inversion      Ankle eversion       (Blank rows = not tested) *= pain    FUNCTIONAL TESTS:  2 minute walk test: 200 feet Transfers: RLE extended, relies L and UE support  GAIT: Distance walked: 200 feet Assistive device utilized: Walker - 2 wheeled Level of assistance: Modified independence Comments: 2 MWT; slightly antalgic with RW, decreased R knee flexion/extension    TODAY'S TREATMENT:\ 07/17/22: 07/19/22 no AD, improved heel strike 419ft Squat 10x  Body craft Retro 3Pl 3RT; Sidestep 2Pl 4RT each Bike full revolution x 5 min Leg press 3Pl 2x 10 slow mechanics Stairs 5RT with 2 HR A reciprocal pattern 7in step height Tandem stance on foam 2x 30" no HHA Vector stance 5x 5"   07/15/22: Bike full rotation x 5 min L3 seat 10 FOTO 50% was 39% 07/17/22 no AD 404 Stairs: demonstrates step to pattern 7in, able to complete  reciprocal pattern following cueing on 4in then 7in step height 3RT total.  Increased difficulty descending stairs.   5STS 16.58" no HHA HR/TR 15 each Lateral step up 4in height 10x  Step down 4in step height Squat front of chair MMT AROM 0-119 SLS Lt 12", Rt 7" Vector stance 3x 5"  BLE with 1HHA  07/09/22 Palpation/joint mobility: patient TTP at R MCL and LCL but varus/valgus tests intact without laxity Bike full rotation x 5 min L3 seat 10 HR/TR 1x 15 Step up 4 inch 2x 10  Lateral step up 4 inch 2x 10 STS 1x 10  TKE 10 x 5 second holds 3 plates            04/6439:  Bike full rotation x 5 min L3 seat 10 Knee drive on 34VQ step 5x 10" Hamstring stretch 3x 30" on 12in step Heel/toe raises 15x5" TKE 10x 5" with RTB Hip abduction 10x Sidestep down blue line (RTB around thigh) 2RT Controlled STS 10x  Prone: quad stretch with rope 2x 30"  Hamstring cult 3# 10x Supine: AROM 2-123 degrees Shown seated piriformis stretch   06/26/22              Bike full rotation x 5 min 12 to 11              Sitting              LAQ x 10-3#              Supine:              Active hamstring stretch x  3 ; 20"              SAQ 3# x 10; SAQ with ER x 10 no wt               Quad set x 10               Heel slide x 10               Bridge x 10               Prone:                Quad stretch x 10              Ham curl with 3# x 10     PATIENT EDUCATION:  Education details: 06/19/22: HEP; EVAL: Patient educated on exam findings, POC, scope of PT, HEP, and knee positioning at rest. Person educated: Patient Education method: Explanation, Demonstration, and Handouts Education comprehension: verbalized understanding, returned demonstration, verbal cues required, and tactile cues required   HOME EXERCISE PROGRAM: Access Code: [email or web address] URL: [email or web address]  07/17/22: tandem stance  Discussed return to gym  07/15/22: SLS and vector stance  8/23- Sit to Stand with Arms  Crossed  - 1 x daily - 7 x weekly - 2 sets - 10 reps  07/03/22: sidestep with theraband  06/19/22 - Heel Raises with Counter Support  - 1 x daily - 7 x weekly - 2 sets - 10 reps - Toe Raises with Counter Support  - 1 x daily - 7 x weekly - 2 sets - 10 reps - Standing Hip Abduction with Counter Support  - 1 x daily - 7 x weekly - 2 sets - 10 reps  Date: 06/17/2022 - Active Straight Leg Raise with Quad Set  - 3 x daily - 7 x weekly - 2 sets - 10 reps - Supine Heel Slide with Strap  - 3 x daily - 7 x weekly - 10 reps - 5-10 second hold  06/24/22: clam BLE  ASSESSMENT:  Patient has met 3/3 short term and 3/5 long term goals with ability to complete HEP and improvement in  symptoms, strength, gait, balance, ROM, and functional mobility. Remaining goals not met with decreased gait speed and activity limitations although extreme progress was made since beginning PT. Patient wishing to transition to HEP and self management. Patient discharged from PT at this time.  12:40 PM, 07/17/22 Mearl Latin PT, DPT Physical Therapist at [place]: Pt progressing well towards POC with improved heel to toe mechanics during gait.  Session focus with functional strengthening and balance training.  Pt able to demonstrated appropriate reciprocal pattern on stair with HHA for safety.  Added bodycraft walking and leg press for gluteal, balance and LE strengthening that was tolerated well.  Pt returns to MD tomorrow and feels ready for DC to HEP.  Reviewed importance of continuing HEP upon DC and returning to gym for strengthening.  Encouraged pt to discussed with gym staff on appropriate machines to complete, educated on proper weight.  No reports of increased pain through session.  Added tandem stance to HEP for balance.    OBJECTIVE IMPAIRMENTS Abnormal gait, decreased activity tolerance, decreased balance, decreased endurance, decreased mobility, difficulty walking,  decreased ROM, decreased strength, increased muscle spasms, impaired flexibility, improper body mechanics, and pain.   ACTIVITY LIMITATIONS lifting, bending, standing, squatting, sleeping, stairs, transfers, bathing, locomotion level, and caring for others  PARTICIPATION LIMITATIONS: meal prep, cleaning, laundry, shopping, community activity, and yard work  PERSONAL FACTORS Time since onset of injury/illness/exacerbation and 3+ comorbidities: : Anxiety , Arthritis, Back pain, hx Cancer, Depression,GERD  are also affecting patient's functional outcome.   REHAB POTENTIAL: Good  CLINICAL DECISION MAKING: Stable/uncomplicated  EVALUATION COMPLEXITY: Low   GOALS: Goals reviewed with patient? Yes  SHORT TERM GOALS: Target date: 07/08/2022  Patient will be independent with HEP in order to improve functional outcomes. Baseline: 07/15/22:  Reports compliance with HEP daily.   Goal status: MET  2.  Patient will report at least 25% improvement in symptoms for improved quality of life. Baseline: 07/15/22:  Reports she feels she has improved by 100%, feels ready for DC this week. Goal status: MET  3.  Patient will be able to transfer to standing/sitting with equal weightbearing without UE support to demonstrate improved functional strength. Baseline: relies LLE with UE use; 07/15/22: Able to complete equal STS, 5STS 16.58" no HHA required. Goal status: MET    LONG TERM GOALS: Target date: 07/29/2022  Patient will report at least 75% improvement in symptoms for improved quality of life. Baseline: 07/15/22:  Reports she feels she has improved by 100%, feels ready for DC this week. Goal status: MET  2.  Patient will improve FOTO score by at least 23 points in order to indicate improved tolerance to activity. Baseline: 39% function, 07/15/22:  50% functional Goal status: IN PROGRESS  3.  Patient will be able to navigate stairs with reciprocal pattern without compensation in order to demonstrate  improved LE strength. Baseline: 07/15/22:  Demonstrate step to pattern 7in; able to complete reciprocal pattern following cueing on 4in then 7in step height. Goal status: MET  4.  Patient will be able to ambulate at least 425 feet in 2MWT in order to demonstrate improved tolerance to activity. Baseline: 200 feet with RW; 07/15/22: 2MWT 495ft no AD; 07/17/22: 2MWT 452ft no AD. Goal status: IN PROGRESS  5.  Patient will improve ROM for R knee extension/flexion to 0-120 degrees to improve squatting, and other functional mobility. Baseline: lacking 4 to 98; 07/15/22: 0-119 degrees Goal status: MET  PLAN: PT FREQUENCY: 2x/week  PT DURATION: 6 weeks  PLANNED INTERVENTIONS: Therapeutic exercises, Therapeutic activity, Neuromuscular re-education, Balance training, Gait training, Patient/Family education, Joint manipulation, Joint mobilization, Stair training, Orthotic/Fit training, DME instructions, Aquatic Therapy, Dry Needling, Electrical stimulation, Spinal manipulation, Spinal mobilization, Cryotherapy, Moist heat, Compression bandaging, scar mobilization, Splintting, Taping, Traction, Ultrasound, Ionotophoresis 4mg /ml Dexamethasone, and Manual therapy   PLAN FOR NEXT SESSION:  DC to HEP/return to gym.  Ihor Austin, LPTA/CLT; CBIS [phone] 9:19 AM, 07/17/22

## 2022-07-18 DIAGNOSIS — M1711 Unilateral primary osteoarthritis, right knee: Secondary | ICD-10-CM | POA: Diagnosis not present

## 2022-07-22 ENCOUNTER — Encounter (HOSPITAL_COMMUNITY): Payer: Medicare Other | Admitting: Physical Therapy

## 2022-07-24 ENCOUNTER — Encounter (HOSPITAL_COMMUNITY): Payer: Medicare Other | Admitting: Physical Therapy

## 2022-07-28 ENCOUNTER — Telehealth: Payer: Self-pay | Admitting: Cardiology

## 2022-07-28 NOTE — Telephone Encounter (Signed)
Fraser Din will come tomorrow at 8 am to get 7 day Zio

## 2022-07-28 NOTE — Telephone Encounter (Signed)
The only consistent symptom is SOB which she blamed on recent inactivity. No tachycardia now. She scans her Fit Bit and notes the number of steps she takes does not correlate with what she actually walks.

## 2022-07-28 NOTE — Telephone Encounter (Signed)
Kathy Evans had knee replacement 9 weeks ago. Three weeks ago she noted her Fit Bit was tracking more steps than she was actually walking and she has had SOB. Two days ago it recorded a night time HR of 146. She has hx of PAF but has not noted any palpitations.  Would you like her to wear a Zio monitor? She has f/u next month with you.

## 2022-07-28 NOTE — Telephone Encounter (Signed)
Pt c/o Shortness Of Breath: STAT if SOB developed within the last 24 hours or pt is noticeably SOB on the phone  1. Are you currently SOB (can you hear that pt is SOB on the phone)? No   2. How long have you been experiencing SOB? 3 weeks   3. Are you SOB when sitting or when up moving around? Up moving around, its intermittent   4. Are you currently experiencing any other symptoms? No   Pt checked her HR: going up to 146, she states she has not done anything strenuous

## 2022-07-29 ENCOUNTER — Ambulatory Visit: Payer: Medicare Other | Attending: Cardiology | Admitting: *Deleted

## 2022-07-29 ENCOUNTER — Ambulatory Visit (INDEPENDENT_AMBULATORY_CARE_PROVIDER_SITE_OTHER): Payer: Medicare Other

## 2022-07-29 ENCOUNTER — Encounter (HOSPITAL_COMMUNITY): Payer: Medicare Other | Admitting: Physical Therapy

## 2022-07-29 DIAGNOSIS — I4892 Unspecified atrial flutter: Secondary | ICD-10-CM | POA: Diagnosis not present

## 2022-07-29 NOTE — Progress Notes (Signed)
Pt in office to have Zio monitor placed.

## 2022-07-31 ENCOUNTER — Encounter (HOSPITAL_COMMUNITY): Payer: Medicare Other | Admitting: Physical Therapy

## 2022-08-14 ENCOUNTER — Emergency Department (HOSPITAL_COMMUNITY)
Admission: EM | Admit: 2022-08-14 | Discharge: 2022-08-14 | Disposition: A | Discharge status: Home / Self Care | Payer: Medicare Other | Attending: Student | Admitting: Student

## 2022-08-14 ENCOUNTER — Other Ambulatory Visit: Payer: Self-pay

## 2022-08-14 ENCOUNTER — Emergency Department (HOSPITAL_COMMUNITY): Payer: Medicare Other

## 2022-08-14 ENCOUNTER — Encounter (HOSPITAL_COMMUNITY): Payer: Self-pay | Admitting: *Deleted

## 2022-08-14 DIAGNOSIS — R110 Nausea: Secondary | ICD-10-CM | POA: Diagnosis not present

## 2022-08-14 DIAGNOSIS — R739 Hyperglycemia, unspecified: Secondary | ICD-10-CM | POA: Insufficient documentation

## 2022-08-14 DIAGNOSIS — I4892 Unspecified atrial flutter: Secondary | ICD-10-CM | POA: Insufficient documentation

## 2022-08-14 DIAGNOSIS — K5730 Diverticulosis of large intestine without perforation or abscess without bleeding: Secondary | ICD-10-CM | POA: Diagnosis not present

## 2022-08-14 DIAGNOSIS — R1030 Lower abdominal pain, unspecified: Secondary | ICD-10-CM | POA: Diagnosis present

## 2022-08-14 DIAGNOSIS — Z853 Personal history of malignant neoplasm of breast: Secondary | ICD-10-CM | POA: Diagnosis not present

## 2022-08-14 DIAGNOSIS — Z7901 Long term (current) use of anticoagulants: Secondary | ICD-10-CM | POA: Insufficient documentation

## 2022-08-14 DIAGNOSIS — R109 Unspecified abdominal pain: Secondary | ICD-10-CM | POA: Diagnosis not present

## 2022-08-14 DIAGNOSIS — K5792 Diverticulitis of intestine, part unspecified, without perforation or abscess without bleeding: Secondary | ICD-10-CM | POA: Diagnosis not present

## 2022-08-14 DIAGNOSIS — I700 Atherosclerosis of aorta: Secondary | ICD-10-CM | POA: Diagnosis not present

## 2022-08-14 DIAGNOSIS — R6889 Other general symptoms and signs: Secondary | ICD-10-CM | POA: Diagnosis not present

## 2022-08-14 DIAGNOSIS — K5732 Diverticulitis of large intestine without perforation or abscess without bleeding: Secondary | ICD-10-CM

## 2022-08-14 DIAGNOSIS — R1111 Vomiting without nausea: Secondary | ICD-10-CM | POA: Diagnosis not present

## 2022-08-14 DIAGNOSIS — R0689 Other abnormalities of breathing: Secondary | ICD-10-CM | POA: Diagnosis not present

## 2022-08-14 DIAGNOSIS — D72829 Elevated white blood cell count, unspecified: Secondary | ICD-10-CM | POA: Insufficient documentation

## 2022-08-14 DIAGNOSIS — Z743 Need for continuous supervision: Secondary | ICD-10-CM | POA: Diagnosis not present

## 2022-08-14 LAB — URINALYSIS, ROUTINE W REFLEX MICROSCOPIC
Bilirubin Urine: NEGATIVE
Glucose, UA: NEGATIVE mg/dL
Ketones, ur: NEGATIVE mg/dL
Nitrite: NEGATIVE
Protein, ur: >=300 mg/dL — AB
Specific Gravity, Urine: 1.008 (ref 1.005–1.030)
pH: 8 (ref 5.0–8.0)

## 2022-08-14 LAB — CBC WITH DIFFERENTIAL/PLATELET
Abs Immature Granulocytes: 0.05 10*3/uL (ref 0.00–0.07)
Basophils Absolute: 0.1 10*3/uL (ref 0.0–0.1)
Basophils Relative: 0 %
Eosinophils Absolute: 0 10*3/uL (ref 0.0–0.5)
Eosinophils Relative: 0 %
HCT: 44.9 % (ref 36.0–46.0)
Hemoglobin: 14.8 g/dL (ref 12.0–15.0)
Immature Granulocytes: 0 %
Lymphocytes Relative: 3 %
Lymphs Abs: 0.5 10*3/uL — ABNORMAL LOW (ref 0.7–4.0)
MCH: 31.2 pg (ref 26.0–34.0)
MCHC: 33 g/dL (ref 30.0–36.0)
MCV: 94.7 fL (ref 80.0–100.0)
Monocytes Absolute: 0.9 10*3/uL (ref 0.1–1.0)
Monocytes Relative: 6 %
Neutro Abs: 13.3 10*3/uL — ABNORMAL HIGH (ref 1.7–7.7)
Neutrophils Relative %: 91 %
Platelets: 248 10*3/uL (ref 150–400)
RBC: 4.74 MIL/uL (ref 3.87–5.11)
RDW: 13.7 % (ref 11.5–15.5)
WBC: 14.8 10*3/uL — ABNORMAL HIGH (ref 4.0–10.5)
nRBC: 0 % (ref 0.0–0.2)

## 2022-08-14 LAB — COMPREHENSIVE METABOLIC PANEL
ALT: 13 U/L (ref 0–44)
AST: 14 U/L — ABNORMAL LOW (ref 15–41)
Albumin: 3.7 g/dL (ref 3.5–5.0)
Alkaline Phosphatase: 79 U/L (ref 38–126)
Anion gap: 10 (ref 5–15)
BUN: 17 mg/dL (ref 8–23)
CO2: 25 mmol/L (ref 22–32)
Calcium: 9.4 mg/dL (ref 8.9–10.3)
Chloride: 106 mmol/L (ref 98–111)
Creatinine, Ser: 0.83 mg/dL (ref 0.44–1.00)
GFR, Estimated: >60 mL/min (ref >60)
Glucose, Bld: 121 mg/dL — ABNORMAL HIGH (ref 70–99)
Potassium: 4.2 mmol/L (ref 3.5–5.1)
Sodium: 141 mmol/L (ref 135–145)
Total Bilirubin: 0.7 mg/dL (ref 0.3–1.2)
Total Protein: 6.9 g/dL (ref 6.5–8.1)

## 2022-08-14 LAB — LIPASE, BLOOD: Lipase: 25 U/L (ref 11–51)

## 2022-08-14 LAB — CBG MONITORING, ED: Glucose-Capillary: 147 mg/dL — ABNORMAL HIGH (ref 70–99)

## 2022-08-14 MED ORDER — AMOXICILLIN-POT CLAVULANATE 875-125 MG PO TABS
1.0000 | ORAL_TABLET | Freq: Once | ORAL | Status: AC
  Administered 2022-08-14: 1 via ORAL
  Filled 2022-08-14: qty 1

## 2022-08-14 MED ORDER — ONDANSETRON HCL 4 MG/2ML IJ SOLN
4.0000 mg | Freq: Once | INTRAMUSCULAR | Status: AC
  Administered 2022-08-14: 4 mg via INTRAVENOUS
  Filled 2022-08-14: qty 2

## 2022-08-14 MED ORDER — LACTATED RINGERS IV BOLUS
1000.0000 mL | Freq: Once | INTRAVENOUS | Status: AC
  Administered 2022-08-14: 1000 mL via INTRAVENOUS

## 2022-08-14 MED ORDER — AMOXICILLIN-POT CLAVULANATE 875-125 MG PO TABS: 1.0000 | ORAL_TABLET | Freq: Two times a day (BID) | ORAL | 0 refills | Status: AC

## 2022-08-14 MED ORDER — AMOXICILLIN-POT CLAVULANATE 875-125 MG PO TABS: 1.0000 | ORAL_TABLET | Freq: Once | ORAL | Status: DC

## 2022-08-14 MED ORDER — DICYCLOMINE HCL 10 MG PO CAPS
10.0000 mg | ORAL_CAPSULE | Freq: Once | ORAL | Status: AC
  Administered 2022-08-14: 10 mg via ORAL
  Filled 2022-08-14: qty 1

## 2022-08-14 MED ORDER — DICYCLOMINE HCL 20 MG PO TABS: 20.0000 mg | ORAL_TABLET | Freq: Two times a day (BID) | ORAL | 0 refills | Status: DC

## 2022-08-14 NOTE — ED Notes (Signed)
Tech verbalized blood noted form rectum during loose stools. Nurse asked tech to get Dr due to nurse in another pr room. Nurse noted Conner going into room to assess.

## 2022-08-14 NOTE — ED Triage Notes (Signed)
Pt brought in by [facility] for c/o abdominal pain with n/v/d that started this am

## 2022-08-14 NOTE — ED Provider Notes (Signed)
[facility] EMERGENCY DEPARTMENT Provider Note   CSN: [id number] Arrival date & time: 08/14/22  1428     History  Chief Complaint  Patient presents with   Abdominal Pain    Kathy Evans is a 71 y.o. female. With past medical history of breast cancer, atrial flutter on Eliquis, GERD, hyperlipidemia, IBS who presents to the emergency department with abdominal pain.  States she woke up this morning around 4:30a and had an "easy" bowel movement. States that she then woke up at 7:30a with an easy bowel movement that changed to watery, non-bloody diarrhea. States she has had >20 episodes of diarrhea since this morning. She describes having cramping lower abdominal pain. Endorses nausea with multiple episodes of non-bloody emesis. She denies fever, dysuria, vaginal discharge. Denies sick contacts, travel, or possible contaminated foods.    HPI     Home Medications Prior to Admission medications   Medication Sig Start Date End Date Taking? Authorizing Provider  amoxicillin-clavulanate (AUGMENTIN) 875-125 MG tablet Take 1 tablet by mouth every 12 (twelve) hours for 7 days. 08/14/22 08/21/22 Yes Mickie Hillier, PA-C  dicyclomine (BENTYL) 20 MG tablet Take 1 tablet (20 mg total) by mouth 2 (two) times daily. 08/14/22  Yes Mickie Hillier, PA-C  acetaminophen (TYLENOL) 500 MG tablet Take 2 tablets (1,000 mg total) by mouth every 6 (six) hours as needed. 05/30/22 05/30/23  Ventura Bruns, PA-C  ALPRAZolam Duanne Moron) 0.5 MG tablet Take 0.5 mg by mouth daily as needed for anxiety or sleep.    [provider]  apixaban (ELIQUIS) 5 MG TABS tablet Take 1 tablet (5 mg total) by mouth 2 (two) times daily. Take once a day for the first two days after surgery and then go back to your normal twice a day regimen 05/30/22   Merlene Pulling K, PA-C  buPROPion (WELLBUTRIN XL) 300 MG 24 hr tablet Take 300 mg by mouth at bedtime.    [provider]  Calcium Carb-Cholecalciferol (CALCIUM 600+D) 600-20  MG-MCG TABS Take 1 tablet by mouth daily.    [provider]  CHIA SEED PO Take 15 mLs by mouth every morning.    [provider]  Cholecalciferol (VITAMIN D3) 125 MCG (5000 UT) CAPS Take 5,000 Units by mouth daily.    [provider]  Collagen-Vitamin C-Biotin (COLLAGEN 1500/C PO) Take 0.5 Scoops by mouth every morning. peptide    [provider]  diclofenac Sodium (VOLTAREN ARTHRITIS PAIN) 1 % GEL Apply 2 g topically daily as needed (Arthritis pain).    [provider]  diltiazem (CARDIZEM CD) 120 MG 24 hr capsule TAKE 1 CAPSULE BY MOUTH  DAILY 08/23/21   Satira Sark, MD  fluticasone [facility]) 50 MCG/ACT nasal spray Place 1-2 sprays into both nostrils daily as needed for allergies or rhinitis.     [provider]  guaiFENesin (MUCINEX) 600 MG 12 hr tablet Take 600 mg by mouth every 12 (twelve) hours.    [provider]  levocetirizine (XYZAL) 5 MG tablet Take 5 mg by mouth every evening. 03/04/18   [provider]  olopatadine (PATADAY) 0.1 % ophthalmic solution Place 1 drop into both eyes daily as needed for allergies.    [provider]  omeprazole (PRILOSEC) 40 MG capsule Take 40 mg by mouth daily before breakfast.     [provider]  ondansetron (ZOFRAN) 4 MG tablet Take 1 tablet (4 mg total) by mouth every 8 (eight) hours as needed for nausea or vomiting.  05/30/22   Ventura Bruns, PA-C  oxyCODONE (OXY IR/ROXICODONE) 5 MG immediate release tablet Take one tab po q4-6hrs prn pain 05/30/22   Ventura Bruns, PA-C  Whey Protein POWD Take 15 mLs by mouth every morning.    [provider]      Allergies    Statins and Tape    Review of Systems   Review of Systems  Constitutional:  Positive for appetite change and diaphoresis. Negative for fever.  Gastrointestinal:  Positive for abdominal pain, diarrhea, nausea and vomiting.  Genitourinary:  Negative for dysuria and vaginal discharge.  All  other systems reviewed and are negative.   Physical Exam Updated Vital Signs BP 128/62   Pulse 67   Temp 98.5 F (36.9 C) (Oral)   Resp (!) 21   Ht '5\' 4"'$  (1.626 m)   Wt 73.5 kg   SpO2 99%   BMI 27.81 kg/m  Physical Exam Vitals and nursing note reviewed.  Constitutional:      General: She is not in acute distress.    Appearance: Normal appearance. She is well-developed. She is ill-appearing. She is not toxic-appearing.  HENT:     Head: Normocephalic.  Eyes:     General: No scleral icterus.    Extraocular Movements: Extraocular movements intact.  Cardiovascular:     Rate and Rhythm: Normal rate and regular rhythm.     Heart sounds: Normal heart sounds.  Pulmonary:     Effort: Pulmonary effort is normal. No respiratory distress.     Breath sounds: Normal breath sounds.  Abdominal:     General: Abdomen is protuberant. Bowel sounds are decreased. There is no distension.     Palpations: Abdomen is soft.     Tenderness: There is abdominal tenderness in the suprapubic area. There is no guarding or rebound. Negative signs include Murphy's sign.  Musculoskeletal:        General: Normal range of motion.     Cervical back: Neck supple.  Skin:    General: Skin is warm and dry.     Capillary Refill: Capillary refill takes less than 2 seconds.  Neurological:     General: No focal deficit present.     Mental Status: She is alert and oriented to person, place, and time. Mental status is at baseline.  Psychiatric:        Mood and Affect: Mood normal.        Behavior: Behavior normal.        Thought Content: Thought content normal.        Judgment: Judgment normal.    ED Results / Procedures / Treatments   Labs (all labs ordered are listed, but only abnormal results are displayed) Labs Reviewed  URINALYSIS, ROUTINE W REFLEX MICROSCOPIC - Abnormal; Notable for the following components:      Result Value   Color, Urine AMBER (*)    APPearance CLOUDY (*)    Hgb urine dipstick  LARGE (*)    Protein, ur >=300 (*)    Leukocytes,Ua LARGE (*)    Bacteria, UA MANY (*)    All other components within normal limits  COMPREHENSIVE METABOLIC PANEL - Abnormal; Notable for the following components:   Glucose, Bld 121 (*)    AST 14 (*)    All other components within normal limits  CBC WITH DIFFERENTIAL/PLATELET - Abnormal; Notable for the following components:   WBC 14.8 (*)    Neutro Abs 13.3 (*)    Lymphs Abs 0.5 (*)  All other components within normal limits  CBG MONITORING, ED - Abnormal; Notable for the following components:   Glucose-Capillary 147 (*)    All other components within normal limits  LIPASE, BLOOD  CBC WITH DIFFERENTIAL/PLATELET  CBC WITH DIFFERENTIAL/PLATELET    EKG None  Radiology No results found.  Procedures Procedures   Medications Ordered in ED Medications  lactated ringers bolus 1,000 mL (0 mLs Intravenous Stopped 08/14/22 1952)  ondansetron (ZOFRAN) injection 4 mg (4 mg Intravenous Given 08/14/22 1608)  dicyclomine (BENTYL) capsule 10 mg (10 mg Oral Given 08/14/22 1607)  amoxicillin-clavulanate (AUGMENTIN) 875-125 MG per tablet 1 tablet (1 tablet Oral Given 08/14/22 1950)    ED Course/ Medical Decision Making/ A&P                           Medical Decision Making Amount and/or Complexity of Data Reviewed Labs: ordered. Radiology: ordered.  Risk Prescription drug management.  This patient presents to the ED with chief complaint(s) of abdominal pain with pertinent past medical history of atrial flutter, IBS, GERD which further complicates the presenting complaint. The complaint involves an extensive differential diagnosis and also carries with it a high risk of complications and morbidity.    The differential diagnosis includes Acute hepatobiliary disease, pancreatitis, appendicitis, PUD, gastritis, SBO, diverticulitis, colitis, viral gastroenteritis, Crohn's, UC, vascular catastrophe, UTI, pyelonephritis, renal stone,  obstructed stone, infected stone, ovarian torsion, ectopic pregnancy, TOA, PID, STD, etc.    Additional history obtained: Additional history obtained from spouse Records reviewed Care Everywhere/External Records and Primary Care Documents  ED Course and Reassessment: 71 year old female who presents to the emergency department with abdominal pain and diarrhea.  She is otherwise well-appearing and nontoxic in appearance.  Her abdomen soft and mildly tender on the left side without peritonitic findings. Initial concern for IBS versus diverticulitis versus other.  Obtaining basic labs and she will need a CT abdomen pelvis. Initiated a liter IV fluids given likely dehydration as well as Bentyl for her cramping and diarrhea and Zofran.  On reassessment she is feeling improved from this. Her labs show a leukocytosis to 14.8 which is likely infectious.  She does have some leuks in her urine but no urinary symptoms and I have low suspicion for UTI.  There is no AKI, transaminitis, electrolyte derangements.  Lipase is normal. CT abdomen pelvis shows that she does have diverticulitis which is uncomplicated.  I gave her first dose of Augmentin here in the emergency department.  We will send her home with a prescription for full course of Augmentin as well as Bentyl as this provided her with some relief of her symptoms.  Do not feel her symptoms are related to other abdominal etiologies of pain such as acute hepatobiliary disease, pancreatitis, appendicitis, SBO or, vascular catastrophe, GU related.  Discussed that she will take Augmentin to completion and return if she has worsening abdominal pains with fever.  She verbalized understanding.  She is tolerating p.o. liquids here without difficulty and also discussed to return if she is not able to tolerate p.o.  She verbalized understanding.  Feel that otherwise she is safe for discharge at this time.  Patient agrees with plan.  Independent labs interpretation:   The following labs were independently interpreted: CBC with mild leukocytosis to 14.8, no electrolyte derangement, AKI, transaminitis.  Lipase is normal.  UA with many leuks however do not feel that this is urinary.    Independent visualization of imaging: -  I independently visualized the following imaging with scope of interpretation limited to determining acute life threatening conditions related to emergency care: CT abdomen pelvis, which revealed  3. Pericolonic edema adjacent the sigmoid and distal descending  colon, suspicious for noncomplicated diverticulitis. Trace pelvic  fluid is likely secondary. Depending on clinical concern, repeat CT  with oral and IV contrast should be considered    Consultation: - Consulted or discussed management/test interpretation w/ external professional: Not indicated  Consideration for admission or further workup: Not indicated Social Determinants of health: None notified Final Clinical Impression(s) / ED Diagnoses Final diagnoses:  Diverticulitis of colon    Rx / DC Orders ED Discharge Orders          Ordered    amoxicillin-clavulanate (AUGMENTIN) 875-125 MG tablet  Every 12 hours        08/14/22 2055    dicyclomine (BENTYL) 20 MG tablet  2 times daily        08/14/22 2055              Mickie Hillier, PA-C 08/20/22 1426    [place], [place], MD 08/22/22 [phone]

## 2022-08-14 NOTE — Discharge Instructions (Addendum)
You are seen in the emergency department a day for abdominal pain, diarrhea.  You have diverticulitis which is an inflammation of the colon.  This is likely why you are having some bright red blood in your bowel movements.  Your hemoglobin or your blood count is normal and not low.  This condition requires antibiotics.  I am prescribing you Augmentin that you will take for the next week.  I am also prescribing you Bentyl which will help with abdominal cramping.  Please return to the emergency department for severely worsening abdominal pain or fevers.

## 2022-08-15 DIAGNOSIS — I4892 Unspecified atrial flutter: Secondary | ICD-10-CM | POA: Diagnosis not present

## 2022-08-25 DIAGNOSIS — M8580 Other specified disorders of bone density and structure, unspecified site: Secondary | ICD-10-CM | POA: Diagnosis not present

## 2022-08-25 DIAGNOSIS — E782 Mixed hyperlipidemia: Secondary | ICD-10-CM | POA: Diagnosis not present

## 2022-08-25 DIAGNOSIS — E559 Vitamin D deficiency, unspecified: Secondary | ICD-10-CM | POA: Diagnosis not present

## 2022-08-26 IMAGING — CT CT HEART MORP W/ CTA COR W/ SCORE W/ CA W/CM &/OR W/O CM
2 of 13 series · 5 of 20 positions shown, 6 images · non-contrast
Comparison: None.

Addendum:
CLINICAL DATA: Dyspnea on exertion, CAD risk factors

EXAM:
Cardiac/Coronary  CTA
TECHNIQUE: The patient was scanned on a Siemens Somatoform go.Top scanner.

[Series 27: multiphase % cta coronary 0.60 · axial · 0.36mm/px · z∈[+1772,+1824]mm · 3 of 2640 slices shown, 4 images]
[im 660/2640  vessel]
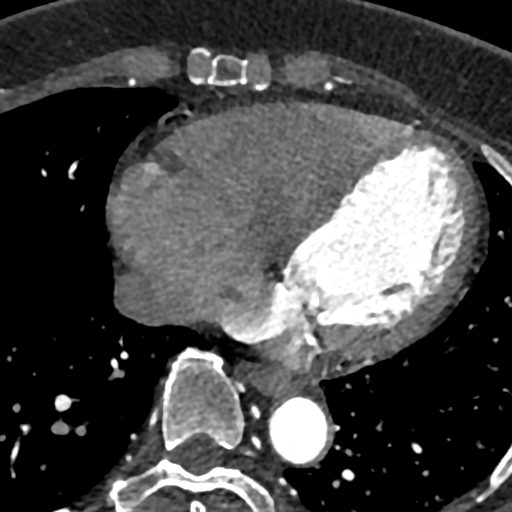
[im 660/2640  lung]
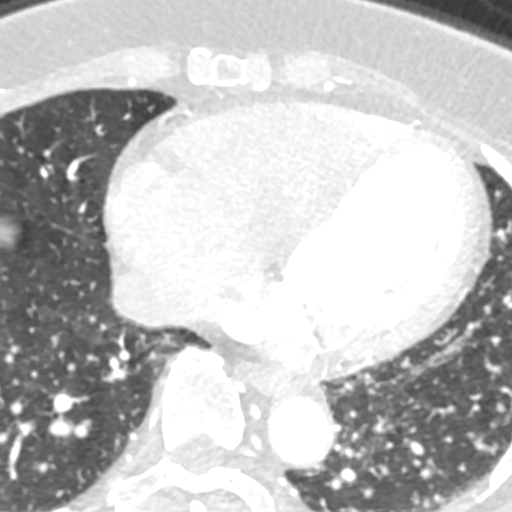
[im 1320/2640  vessel]
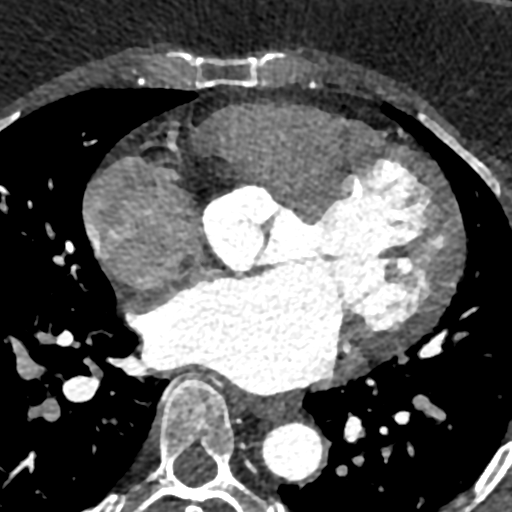
[im 1980/2640  vessel]
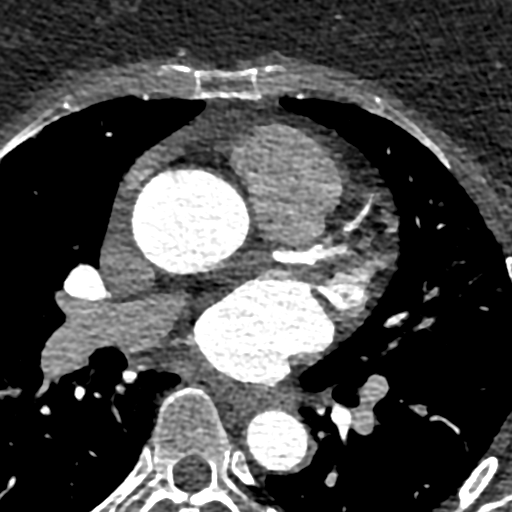

[Series 45: ms multiphase cta coronary 0.60 · axial · 0.36mm/px · z∈[+1780,+1816]mm · 2 of 2112 slices shown]
[im 704/2112  vessel]
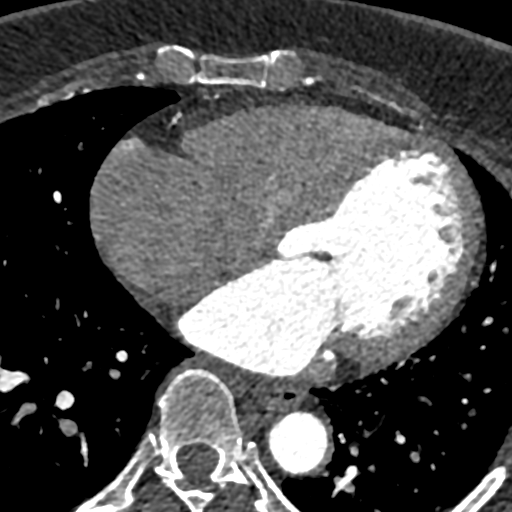
[im 1408/2112  vessel]
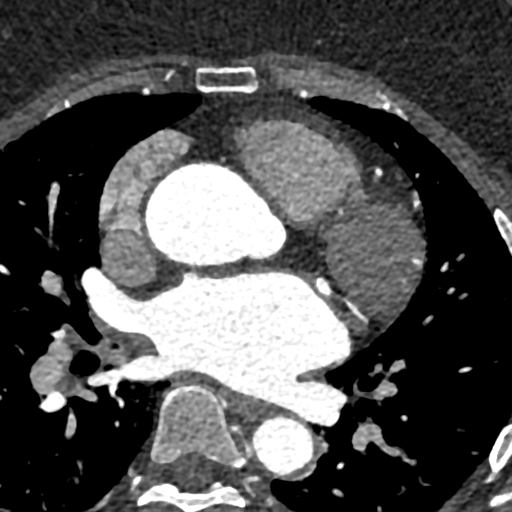

[5 of 20 positions shown; findings below may reference images not displayed]

FINDINGS: A retrospective scan was triggered in the descending thoracic aorta.
Axial non-contrast 3 mm slices were carried out through the heart.
The data set was analyzed on a dedicated work station and scored
using the Agatson method. Gantry rotation speed was 330 msecs and
collimation was .6 mm. 100mg of metoprolol and 0.8 mg of sl NTG was
given. The 3D data set was reconstructed in 5% intervals of the
50-95 % of the R-R cycle. Diastolic phases were analyzed on a
dedicated work station using MPR, MIP and VRT modes. The patient
received 90 cc of contrast.

Aorta:  Normal size.  No calcifications.  No dissection.

Aortic Valve:  Trileaflet.  No calcifications.

Coronary Arteries:  Normal coronary origin.  Left dominance.

RCA is a small nondominant artery.  There is no plaque.

Left main is a large artery that gives rise to LAD, Ramus and LCX
arteries. There is no plaque in the LM or Ramus

LAD is a large vessel that has no plaque.

LCX is a dominant artery that gives rise to one obtuse marginal
branch before giving rise to the PDA and PLA branches. There is no
plaque.

Other findings:

Normal pulmonary vein drainage into the left atrium.

Normal left atrial appendage without a thrombus.

Normal size of the pulmonary artery.
IMPRESSION: 1. Coronary calcium score of 0. Patient is low risk for near term
coronary events

2. Normal coronary origin with left dominance.

3. No evidence of CAD.

4. CAD-RADS 0. Consider non-atherosclerotic causes of chest pain.

EXAM:
OVER-READ INTERPRETATION  CT CHEST

The following report is an over-read performed by radiologist Dr.
over-read does not include interpretation of cardiac or coronary
anatomy or pathology. The coronary CTA interpretation by the
cardiologist is attached.
FINDINGS: Limited view of the lung parenchyma demonstrates no suspicious
nodularity. Airways are normal.

Limited view of the mediastinum demonstrates no adenopathy.
Esophagus normal.

Limited view of the upper abdomen unremarkable.

Limited view of the skeleton and chest wall is unremarkable.
IMPRESSION: No significant extracardiac findings. No significant extracardiac
findings.

*** End of Addendum ***
FINDINGS: A retrospective scan was triggered in the descending thoracic aorta.
Axial non-contrast 3 mm slices were carried out through the heart.
The data set was analyzed on a dedicated work station and scored
using the Agatson method. Gantry rotation speed was 330 msecs and
collimation was .6 mm. 100mg of metoprolol and 0.8 mg of sl NTG was
given. The 3D data set was reconstructed in 5% intervals of the
50-95 % of the R-R cycle. Diastolic phases were analyzed on a
dedicated work station using MPR, MIP and VRT modes. The patient
received 90 cc of contrast.

Aorta:  Normal size.  No calcifications.  No dissection.

Aortic Valve:  Trileaflet.  No calcifications.

Coronary Arteries:  Normal coronary origin.  Left dominance.

RCA is a small nondominant artery.  There is no plaque.

Left main is a large artery that gives rise to LAD, Ramus and LCX
arteries. There is no plaque in the LM or Ramus

LAD is a large vessel that has no plaque.

LCX is a dominant artery that gives rise to one obtuse marginal
branch before giving rise to the PDA and PLA branches. There is no
plaque.

Other findings:

Normal pulmonary vein drainage into the left atrium.

Normal left atrial appendage without a thrombus.

Normal size of the pulmonary artery.
IMPRESSION: 1. Coronary calcium score of 0. Patient is low risk for near term
coronary events

2. Normal coronary origin with left dominance.

3. No evidence of CAD.

4. CAD-RADS 0. Consider non-atherosclerotic causes of chest pain.

## 2022-08-28 ENCOUNTER — Encounter: Payer: Self-pay | Admitting: Internal Medicine

## 2022-08-28 DIAGNOSIS — Z23 Encounter for immunization: Secondary | ICD-10-CM | POA: Diagnosis not present

## 2022-08-28 DIAGNOSIS — K219 Gastro-esophageal reflux disease without esophagitis: Secondary | ICD-10-CM | POA: Diagnosis not present

## 2022-08-28 DIAGNOSIS — K581 Irritable bowel syndrome with constipation: Secondary | ICD-10-CM | POA: Diagnosis not present

## 2022-08-28 DIAGNOSIS — M179 Osteoarthritis of knee, unspecified: Secondary | ICD-10-CM | POA: Diagnosis not present

## 2022-08-28 DIAGNOSIS — G4700 Insomnia, unspecified: Secondary | ICD-10-CM | POA: Insufficient documentation

## 2022-08-28 DIAGNOSIS — M8580 Other specified disorders of bone density and structure, unspecified site: Secondary | ICD-10-CM | POA: Diagnosis not present

## 2022-08-28 DIAGNOSIS — K5792 Diverticulitis of intestine, part unspecified, without perforation or abscess without bleeding: Secondary | ICD-10-CM | POA: Insufficient documentation

## 2022-08-28 DIAGNOSIS — Z0001 Encounter for general adult medical examination with abnormal findings: Secondary | ICD-10-CM | POA: Diagnosis not present

## 2022-08-28 DIAGNOSIS — I480 Paroxysmal atrial fibrillation: Secondary | ICD-10-CM | POA: Diagnosis not present

## 2022-08-28 DIAGNOSIS — Z0000 Encounter for general adult medical examination without abnormal findings: Secondary | ICD-10-CM | POA: Diagnosis not present

## 2022-08-28 DIAGNOSIS — F331 Major depressive disorder, recurrent, moderate: Secondary | ICD-10-CM | POA: Insufficient documentation

## 2022-08-28 DIAGNOSIS — E782 Mixed hyperlipidemia: Secondary | ICD-10-CM | POA: Diagnosis not present

## 2022-09-03 ENCOUNTER — Encounter: Payer: Self-pay | Admitting: Cardiology

## 2022-09-03 ENCOUNTER — Ambulatory Visit: Payer: Medicare Other | Attending: Cardiology | Admitting: Cardiology

## 2022-09-03 VITALS — BP 136/80 | HR 70 | Ht 64.0 in | Wt 173.8 lb

## 2022-09-03 DIAGNOSIS — I4892 Unspecified atrial flutter: Secondary | ICD-10-CM

## 2022-09-03 DIAGNOSIS — E782 Mixed hyperlipidemia: Secondary | ICD-10-CM | POA: Diagnosis not present

## 2022-09-03 DIAGNOSIS — Z789 Other specified health status: Secondary | ICD-10-CM

## 2022-09-03 DIAGNOSIS — I471 Supraventricular tachycardia, unspecified: Secondary | ICD-10-CM | POA: Diagnosis not present

## 2022-09-03 NOTE — Progress Notes (Signed)
Cardiology Office Note  Date: 09/03/2022   ID: Kathy Evans, DOB 12-Mar-1951, MRN [id number]  PCP:  Celene Squibb, MD  Cardiologist:  Rozann Lesches, MD Electrophysiologist:  None   Chief Complaint  Patient presents with   Cardiac follow-up    History of Present Illness: Kathy Evans is a 71 y.o. female last seen in May by Mr. Barbarann Ehlers NP, I reviewed the note.  She is here today with her husband for a follow-up visit.  We went over her recent lab work, specifically lipid profile showing worsening numbers.  Her LDL is up to 180.  She has been less active due to limitations related to her knees.  She has a history of multi statin intolerance (recurring muscle cramping) including Lipitor, Crestor, and simvastatin.  She had similar symptoms on Zetia.  We discussed referral to lipid clinic for discussion of alternatives.  Recent cardiac monitor showed occasional PACs and rare PVCs, also multiple episodes of SVT that were relatively brief, the longest of which was 19 seconds.  She tells me that she does not feel any progressive sense of palpitations at this time and we have decided to keep her Cardizem CD 120 mg daily.  Otherwise tolerating Eliquis, I reviewed her recent lab work.  She had a recent flare of IBS with abdominal and pelvic CT results noted below.  Past Medical History:  Diagnosis Date   Arthritis    Atrial flutter [facility])    Diagnosed October 2021   Breast cancer ([place]) 03/2017   Right   Bronchitis    Depression    GERD (gastroesophageal reflux disease)    Heart murmur    Hyperlipidemia    IBS (irritable bowel syndrome)    Impingement syndrome of left shoulder    Personal history of radiation therapy 2018    Past Surgical History:  Procedure Laterality Date   ADENOIDECTOMY     APPENDECTOMY     BREAST BIOPSY Left    benign   BREAST LUMPECTOMY Right 2018   BUNIONECTOMY Right    CARDIAC CATHETERIZATION     CATARACT EXTRACTION, BILATERAL     CHOLECYSTECTOMY      colon adhesion     COLONOSCOPY WITH PROPOFOL N/A 02/02/2018   Dr. Oneida Alar: multiple small and large-mouthed diverticula in recto-sigmoid colon, sigmoid, and descending. TI normal. Internal hemorrhoids during retroflexion, small. Moderate external hemorrhoids   Full mastectomy Right    MASTECTOMY, PARTIAL Right    PROLAPSED UTERINE FIBROID LIGATION     REPLACEMENT TOTAL KNEE Right 05/30/2022   TOE SURGERY Left    TONSILLECTOMY     TOTAL KNEE ARTHROPLASTY Right 05/30/2022   Procedure: TOTAL KNEE ARTHROPLASTY;  Surgeon: Earlie Server, MD;  Location: [facility];  Service: Orthopedics;  Laterality: Right;   TUBAL LIGATION     VAGINAL HYSTERECTOMY     VESICOVAGINAL FISTULA CLOSURE      Current Outpatient Medications  Medication Sig Dispense Refill   acetaminophen (TYLENOL) 500 MG tablet Take 2 tablets (1,000 mg total) by mouth every 6 (six) hours as needed. 100 tablet 2   ALPRAZolam (XANAX) 0.5 MG tablet Take 0.5 mg by mouth daily as needed for anxiety or sleep.     apixaban (ELIQUIS) 5 MG TABS tablet Take 1 tablet (5 mg total) by mouth 2 (two) times daily. Take once a day for the first two days after surgery and then go back to your normal twice a day regimen 180 tablet 1   buPROPion (WELLBUTRIN XL) 300 MG  24 hr tablet Take 300 mg by mouth at bedtime.     Calcium Carb-Cholecalciferol (CALCIUM 600+D) 600-20 MG-MCG TABS Take 1 tablet by mouth daily.     CHIA SEED PO Take 15 mLs by mouth every morning.     Cholecalciferol (VITAMIN D3) 125 MCG (5000 UT) CAPS Take 5,000 Units by mouth daily.     Collagen-Vitamin C-Biotin (COLLAGEN 1500/C PO) Take 0.5 Scoops by mouth every morning. peptide     diclofenac Sodium (VOLTAREN ARTHRITIS PAIN) 1 % GEL Apply 2 g topically daily as needed (Arthritis pain).     dicyclomine (BENTYL) 20 MG tablet Take 1 tablet (20 mg total) by mouth 2 (two) times daily. 20 tablet 0   diltiazem (CARDIZEM CD) 120 MG 24 hr capsule TAKE 1 CAPSULE BY MOUTH  DAILY 90 capsule 3    levocetirizine (XYZAL) 5 MG tablet Take 5 mg by mouth every evening.     olopatadine (PATADAY) 0.1 % ophthalmic solution Place 1 drop into both eyes daily as needed for allergies.     omeprazole (PRILOSEC) 40 MG capsule Take 40 mg by mouth daily before breakfast.      ondansetron (ZOFRAN) 4 MG tablet Take 1 tablet (4 mg total) by mouth every 8 (eight) hours as needed for nausea or vomiting. 20 tablet 0   oxyCODONE (OXY IR/ROXICODONE) 5 MG immediate release tablet Take one tab po q4-6hrs prn pain 40 tablet 0   No current facility-administered medications for this visit.   Allergies:  Statins and Tape   ROS: No dizziness or syncope.  Physical Exam: VS:  BP 136/80   Pulse 70   Ht '5\' 4"'$  (1.626 m)   Wt 173 lb 12.8 oz (78.8 kg)   SpO2 97%   BMI 29.83 kg/m , BMI Body mass index is 29.83 kg/m.  Wt Readings from Last 3 Encounters:  09/03/22 173 lb 12.8 oz (78.8 kg)  08/14/22 162 lb (73.5 kg)  05/30/22 176 lb 8 oz (80.1 kg)    General: Patient appears comfortable at rest. HEENT: Conjunctiva and lids normal. Lungs: Clear to auscultation, nonlabored breathing at rest. Cardiac: Regular rate and rhythm, no S3 or significant systolic murmur, no pericardial rub. Extremities: No pitting edema.  ECG:  An ECG dated 05/21/2022 was personally reviewed today and demonstrated:  Sinus bradycardia with nonspecific ST-T changes.  Recent Labwork: 08/14/2022: ALT 13; AST 14; BUN 17; Creatinine, Ser 0.83; Hemoglobin 14.8; Platelets 248; Potassium 4.2; Sodium 141  October 2023: Cholesterol 250, triglycerides 141, HDL 44, LDL 180, BUN 9, creatinine 0.78, potassium 4.4, AST 12, ALT 10, hemoglobin 12.9, platelets 296  Other Studies Reviewed Today:  Echocardiogram 09/13/2020:  1. Left ventricular ejection fraction, by estimation, is 60 to 65%. The  left ventricle has normal function. The left ventricle has no regional  wall motion abnormalities. Left ventricular diastolic parameters are  indeterminate.   2.  Right ventricular systolic function is normal. The right ventricular  size is normal. There is normal pulmonary artery systolic pressure. The  estimated right ventricular systolic pressure is 52.8 mmHg.   3. Left atrial size was mildly dilated.   4. The mitral valve is grossly normal. Mild mitral valve regurgitation.   5. The aortic valve is tricuspid. Aortic valve regurgitation is mild.  Aortic regurgitation PHT measures 560 msec.   6. The inferior vena cava is normal in size with greater than 50%  respiratory variability, suggesting right atrial pressure of 3 mmHg.   Abdominal and Pelvic CT 08/14/2022: IMPRESSION:  1.  No urinary tract calculi or hydronephrosis. 2. Otherwise, low sensitivity exam secondary to lack of oral or IV contrast. 3. Pericolonic edema adjacent the sigmoid and distal descending colon, suspicious for noncomplicated diverticulitis. Trace pelvic fluid is likely secondary. Depending on clinical concern, repeat CT with oral and IV contrast should be considered. 4.  Tiny hiatal hernia. 5.  Aortic Atherosclerosis (ICD10-I70.0).  Cardiac monitor October 2023: ZIO XT reviewed.  6 days, 13 hours analyzed.   Predominant rhythm is sinus with heart rate ranging from 50 bpm up to 108 bpm and average heart rate 67 bpm. There were occasional PACs representing 3.9% total beats with otherwise rare atrial couplets and triplets. There were rare PVCs including ventricular couplets representing less than 1% total beats. Multiple episodes of SVT were noted, the longest of which lasted for approximately 19 seconds.  These episodes did not clearly correlate with patient triggered events. No sustained arrhythmias or pauses.  Assessment and Plan:  1.  History of atrial flutter with CHA2DS2-VASc score of 2, quiescent including by recent screening cardiac monitor.  She continues on Eliquis for stroke prophylaxis and Cardizem CD at 120 mg daily.  2.  Occasional PACs and brief episodes of  PSVT by recent cardiac monitor.  Overall not particularly symptomatic at this time and we will continue with observation.  Could increase Cardizem CD to 180 mg daily if needed.  3.  Severe mixed hyperlipidemia.  Recent lipid profile revealed total cholesterol 250 and LDL 180.  She has a history of multistatin intolerance as well as intolerance to Zetia.  Referral made to lipid clinic for discussion of possible PCSK9 inhibitors.  Medication Adjustments/Labs and Tests Ordered: Current medicines are reviewed at length with the patient today.  Concerns regarding medicines are outlined above.   Tests Ordered: Orders Placed This Encounter  Procedures   AMB Referral to [facility]    Medication Changes: No orders of the defined types were placed in this encounter.   Disposition:  Follow up  6 months.  Signed, Satira Sark, MD, [facility] 09/03/2022 1:21 PM    [facility] at [facility] [address] [place], [place] [address] Phone: [phone]; Fax: [phone]

## 2022-09-03 NOTE — Patient Instructions (Signed)
Medication Instructions:  Your physician recommends that you continue on your current medications as directed. Please refer to the Current Medication list given to you today.   Labwork: None today  Testing/Procedures: None today  Follow-Up: 6 months  Any Other Special Instructions Will Be Listed Below (If Applicable).    You have been referred to lipid clinic. They will do a telephone appointment.   If you need a refill on your cardiac medications before your next appointment, please call your pharmacy.

## 2022-10-02 ENCOUNTER — Other Ambulatory Visit: Payer: Self-pay | Admitting: Cardiology

## 2022-10-13 DIAGNOSIS — R600 Localized edema: Secondary | ICD-10-CM | POA: Diagnosis not present

## 2022-10-16 ENCOUNTER — Encounter: Payer: Self-pay | Admitting: Internal Medicine

## 2022-10-17 DIAGNOSIS — M1711 Unilateral primary osteoarthritis, right knee: Secondary | ICD-10-CM | POA: Diagnosis not present

## 2022-10-29 DIAGNOSIS — R350 Frequency of micturition: Secondary | ICD-10-CM | POA: Insufficient documentation

## 2022-10-29 DIAGNOSIS — R1030 Lower abdominal pain, unspecified: Secondary | ICD-10-CM | POA: Diagnosis not present

## 2022-11-06 ENCOUNTER — Ambulatory Visit: Payer: Medicare Other | Attending: Cardiology | Admitting: Cardiology

## 2022-11-06 ENCOUNTER — Encounter: Payer: Self-pay | Admitting: Cardiology

## 2022-11-06 VITALS — BP 126/76 | HR 66 | Ht 63.0 in | Wt 170.0 lb

## 2022-11-06 DIAGNOSIS — I4892 Unspecified atrial flutter: Secondary | ICD-10-CM | POA: Diagnosis not present

## 2022-11-06 DIAGNOSIS — M7989 Other specified soft tissue disorders: Secondary | ICD-10-CM | POA: Diagnosis not present

## 2022-11-06 NOTE — Patient Instructions (Addendum)
Medication Instructions:  Your physician recommends that you continue on your current medications as directed. Please refer to the Current Medication list given to you today.  Take Lasix as needed  Labwork: None today  Testing/Procedures: ECHO  Follow-Up: To be determined after echo  Any Other Special Instructions Will Be Listed Below (If Applicable).  If you need a refill on your cardiac medications before your next appointment, please call your pharmacy.

## 2022-11-06 NOTE — Progress Notes (Signed)
Cardiology Office Note  Date: 11/06/2022   ID: Glee Lashomb, [name] 03/17/1951, MRN [id number]  PCP:  Celene Squibb, MD  Cardiologist:  Rozann Lesches, MD Electrophysiologist:  None   Chief Complaint  Patient presents with   Cardiac follow-up    History of Present Illness: Kathy Evans is a 71 y.o. female last seen in October.  She is here with her husband for a follow-up visit.  She had experienced swelling in her lower legs and feet, treated with Lasix by PCP, and also sent for lab work as noted below.  Her BNP level was 208 with normal renal function and she scheduled this visit to discuss the situation further.  Her leg swelling is much better and she would like to cut her Lasix back to as needed.  I discussed the implications of an elevated BNP level, hers was fairly mild and rather nonspecific given the situation.  Her last echocardiogram in October 2021 revealed LVEF 60 to 65% with mild mitral regurgitation and mild aortic regurgitation.  Otherwise, she does not report any progressive sense of palpitations, no chest pain.  We went over her medications today.  I would agree with cutting back Lasix to as needed use.  Otherwise no change in Cardizem CD and Eliquis.  We did discuss getting an updated echocardiogram to ensure no change in cardiac structure and function.  Past Medical History:  Diagnosis Date   Arthritis    Atrial flutter [facility])    Diagnosed October 2021   Breast cancer ([place]) 03/2017   Right   Bronchitis    Depression    GERD (gastroesophageal reflux disease)    Heart murmur    Hyperlipidemia    IBS (irritable bowel syndrome)    Impingement syndrome of left shoulder    Personal history of radiation therapy 2018    Past Surgical History:  Procedure Laterality Date   ADENOIDECTOMY     APPENDECTOMY     BREAST BIOPSY Left    benign   BREAST LUMPECTOMY Right 2018   BUNIONECTOMY Right    CARDIAC CATHETERIZATION     CATARACT EXTRACTION, BILATERAL      CHOLECYSTECTOMY     colon adhesion     COLONOSCOPY WITH PROPOFOL N/A 02/02/2018   Dr. Oneida Alar: multiple small and large-mouthed diverticula in recto-sigmoid colon, sigmoid, and descending. TI normal. Internal hemorrhoids during retroflexion, small. Moderate external hemorrhoids   Full mastectomy Right    MASTECTOMY, PARTIAL Right    PROLAPSED UTERINE FIBROID LIGATION     REPLACEMENT TOTAL KNEE Right 05/30/2022   TOE SURGERY Left    TONSILLECTOMY     TOTAL KNEE ARTHROPLASTY Right 05/30/2022   Procedure: TOTAL KNEE ARTHROPLASTY;  Surgeon: Earlie Server, MD;  Location: [facility];  Service: Orthopedics;  Laterality: Right;   TUBAL LIGATION     VAGINAL HYSTERECTOMY     VESICOVAGINAL FISTULA CLOSURE      Current Outpatient Medications  Medication Sig Dispense Refill   acetaminophen (TYLENOL) 500 MG tablet Take 2 tablets (1,000 mg total) by mouth every 6 (six) hours as needed. 100 tablet 2   ALPRAZolam (XANAX) 0.5 MG tablet Take 0.5 mg by mouth daily as needed for anxiety or sleep.     apixaban (ELIQUIS) 5 MG TABS tablet Take 1 tablet (5 mg total) by mouth 2 (two) times daily. Take once a day for the first two days after surgery and then go back to your normal twice a day regimen 180 tablet 1   buPROPion (  WELLBUTRIN XL) 300 MG 24 hr tablet Take 300 mg by mouth daily.     Calcium-Magnesium-Vitamin D (CALCIUM 1200+D3 PO)      Cholecalciferol (VITAMIN D3) 125 MCG (5000 UT) CAPS Take 5,000 Units by mouth daily.     diclofenac Sodium (VOLTAREN ARTHRITIS PAIN) 1 % GEL Apply 2 g topically daily as needed (Arthritis pain).     diltiazem (CARDIZEM CD) 120 MG 24 hr capsule TAKE 1 CAPSULE BY MOUTH  DAILY 90 capsule 3   furosemide (LASIX) 20 MG tablet Take 1 tablet every day by oral route as needed for 30 days.     levocetirizine (XYZAL) 5 MG tablet Take 5 mg by mouth every evening.     olopatadine (PATADAY) 0.1 % ophthalmic solution Place 1 drop into both eyes daily as needed for allergies.     omeprazole  (PRILOSEC) 40 MG capsule Take 40 mg by mouth daily before breakfast.      ondansetron (ZOFRAN) 4 MG tablet Take 1 tablet (4 mg total) by mouth every 8 (eight) hours as needed for nausea or vomiting. 20 tablet 0   No current facility-administered medications for this visit.   Allergies:  Statins and Tape   ROS: No syncope.  Physical Exam: VS:  BP 126/76   Pulse 66   Ht '5\' 3"'$  (1.6 m)   Wt 170 lb (77.1 kg)   SpO2 98%   BMI 30.11 kg/m , BMI Body mass index is 30.11 kg/m.  Wt Readings from Last 3 Encounters:  11/06/22 170 lb (77.1 kg)  09/03/22 173 lb 12.8 oz (78.8 kg)  08/14/22 162 lb (73.5 kg)    General: Patient appears comfortable at rest. HEENT: Conjunctiva and lids normal. Lungs: Clear to auscultation, nonlabored breathing at rest. Cardiac: Regular rate and rhythm, no S3 or significant systolic murmur. Extremities: Mild ankle edema.  ECG:  An ECG dated 05/21/2022 was personally reviewed today and demonstrated:  Sinus bradycardia with nonspecific ST-T changes.  Recent Labwork: 08/14/2022: ALT 13; AST 14; BUN 17; Creatinine, Ser 0.83; Hemoglobin 14.8; Platelets 248; Potassium 4.2; Sodium 141  November 2023: BNP 208, BUN 15, creatinine 0.86, potassium 4.4, AST 15, ALT 14  Other Studies Reviewed Today:  Echocardiogram 09/13/2020:  1. Left ventricular ejection fraction, by estimation, is 60 to 65%. The  left ventricle has normal function. The left ventricle has no regional  wall motion abnormalities. Left ventricular diastolic parameters are  indeterminate.   2. Right ventricular systolic function is normal. The right ventricular  size is normal. There is normal pulmonary artery systolic pressure. The  estimated right ventricular systolic pressure is 13.2 mmHg.   3. Left atrial size was mildly dilated.   4. The mitral valve is grossly normal. Mild mitral valve regurgitation.   5. The aortic valve is tricuspid. Aortic valve regurgitation is mild.  Aortic regurgitation PHT  measures 560 msec.   6. The inferior vena cava is normal in size with greater than 50%  respiratory variability, suggesting right atrial pressure of 3 mmHg.    Cardiac monitor October 2023: ZIO XT reviewed.  6 days, 13 hours analyzed.   Predominant rhythm is sinus with heart rate ranging from 50 bpm up to 108 bpm and average heart rate 67 bpm. There were occasional PACs representing 3.9% total beats with otherwise rare atrial couplets and triplets. There were rare PVCs including ventricular couplets representing less than 1% total beats. Multiple episodes of SVT were noted, the longest of which lasted for approximately 19 seconds.  These episodes did not clearly correlate with patient triggered events. No sustained arrhythmias or pauses.  Assessment and Plan:  1.  Lower leg and ankle edema, improved with Lasix, cutting this back to as needed use for now.  She did have a mildly elevated BNP of 208, although this is fairly nonspecific in that range.  Would recommend a follow-up echocardiogram to ensure no major change in cardiac structure and function.  LVEF was 60 to 65% with mild mitral rotation and mild aortic regurgitation as of October 2021.  2.  History of atrial flutter and intermittent PSVT, continues on Cardizem CD and also Eliquis with CHA2DS2-VASc score of 2.  She does not report any significant palpitations and her heart rate is regular today.  3.  Severe mixed hyperlipidemia with multi-statin intolerance and also intolerance to Zetia.  Referral pending to lipid clinic for discussion of candidacy for other options.  Medication Adjustments/Labs and Tests Ordered: Current medicines are reviewed at length with the patient today.  Concerns regarding medicines are outlined above.   Tests Ordered: Orders Placed This Encounter  Procedures   ECHOCARDIOGRAM COMPLETE    Medication Changes: No orders of the defined types were placed in this encounter.   Disposition:  Follow up  test  results.  Signed, Satira Sark, MD, [facility] 11/06/2022 3:33 PM    [place] at [facility] [address], [place], [place] [address] Phone: [phone]; Fax: [phone]

## 2022-11-14 ENCOUNTER — Ambulatory Visit (HOSPITAL_COMMUNITY)
Admission: RE | Admit: 2022-11-14 | Discharge: 2022-11-14 | Disposition: A | Discharge status: Home / Self Care | Payer: Medicare Other | Source: Ambulatory Visit | Attending: Cardiology | Admitting: Cardiology

## 2022-11-14 DIAGNOSIS — C50919 Malignant neoplasm of unspecified site of unspecified female breast: Secondary | ICD-10-CM | POA: Insufficient documentation

## 2022-11-14 DIAGNOSIS — R600 Localized edema: Secondary | ICD-10-CM

## 2022-11-14 DIAGNOSIS — I4892 Unspecified atrial flutter: Secondary | ICD-10-CM | POA: Insufficient documentation

## 2022-11-14 DIAGNOSIS — Z923 Personal history of irradiation: Secondary | ICD-10-CM | POA: Diagnosis not present

## 2022-11-14 DIAGNOSIS — Z87891 Personal history of nicotine dependence: Secondary | ICD-10-CM | POA: Insufficient documentation

## 2022-11-14 DIAGNOSIS — I080 Rheumatic disorders of both mitral and aortic valves: Secondary | ICD-10-CM | POA: Insufficient documentation

## 2022-11-14 DIAGNOSIS — M7989 Other specified soft tissue disorders: Secondary | ICD-10-CM | POA: Insufficient documentation

## 2022-11-14 DIAGNOSIS — E785 Hyperlipidemia, unspecified: Secondary | ICD-10-CM | POA: Diagnosis not present

## 2022-11-14 DIAGNOSIS — Z170 Estrogen receptor positive status [ER+]: Secondary | ICD-10-CM | POA: Diagnosis not present

## 2022-11-14 LAB — ECHOCARDIOGRAM COMPLETE
Area-P 1/2: 3.91 cm2
P 1/2 time: 690 ms
S' Lateral: 2.7 cm

## 2022-11-14 NOTE — Progress Notes (Signed)
*  PRELIMINARY RESULTS* Echocardiogram 2D Echocardiogram has been performed.  Kathy Evans 11/14/2022, 3:10 PM

## 2022-11-18 ENCOUNTER — Other Ambulatory Visit: Payer: Self-pay | Admitting: Cardiology

## 2022-11-18 NOTE — Telephone Encounter (Signed)
Prescription refill request for Eliquis received. Indication: PAF Last office visit: 11/06/22  Myles Gip MD Scr: 0.86 on 10/15/22 Age: 72 Weight: 77.1kg  Based on above findings Eliquis '5mg'$  twice daily is the appropriate dose.  Refill approved.

## 2022-11-28 ENCOUNTER — Ambulatory Visit (HOSPITAL_COMMUNITY): Payer: Medicare Other

## 2022-11-29 NOTE — Progress Notes (Unsigned)
Office Visit    Patient Name: Kathy Evans Date of Encounter: 12/01/2022  Primary Care Provider:  Celene Squibb, MD Primary Cardiologist:  Rozann Lesches, MD  Chief Complaint    Hyperlipidemia   Significant Past Medical History   AFlutter CHADS2-VASc = 2 on Eliquis  LEE Improved on furosemide, now prn     Allergies  Allergen Reactions   Statins     PT STATES SHE HAS BEEN MOST STATINS IN THE PAST AND HAS GREAT MUSCLE AND JOINT PAIN..    Tape Itching    Red and itchy    History of Present Illness    Kathy Evans is a 72 y.o. female patient of Dr Domenic Polite, who is in the office today to discuss lipid management options.   She has not had a coronary event, however her LDL cholesterol is at 180.  ASCVD risk score is 9.6%, putting her at intermediate risk.      Insurance Carrier: [employer] ($47/150)  LDL Cholesterol goal:  LDL < 100  Current Medications:   none  Previously tried:  simvastatin, rosuvastatin - cramping, myalgias  Family Hx:  father had MI at 77, developed CHF and died, both brothers - with multiple health issues both deceased, sister COPD, heart issues; 2 children healthy  Social Hx: Tobacco:no - quit 2000 Alcohol: seldom    Diet: mostly home cooked meals; plenty of poultry and fish; no deep fried foods, uses avocado oil, air fryer, pannini grill; occasional salad , has to avoid some veggies 2/2 IBS; taco soup  Exercise: not since knee replacement, now getting back, will go three days per week  Adherence Assessment  Do you ever forget to take your medication? '[]'$ Yes '[x]'$ No  Do you ever skip doses due to side effects? '[]'$ Yes '[x]'$ No  Do you have trouble affording your medicines? '[x]'$ Yes - Eliquis when in donut hole '[]'$ No  Are you ever unable to pick up your medication due to transportation difficulties? '[]'$ Yes '[x]'$ No  Do you ever stop taking your medications because you don't believe they are helping? '[]'$ Yes '[x]'$ No     Accessory  Clinical Findings  Lipid labs:  08/2022 from Delphina Cahill MD  TC 250, TG 141, HDL 44, LDL 180  Lab Results  Component Value Date   ALT 13 08/14/2022   AST 14 (L) 08/14/2022   ALKPHOS 79 08/14/2022   BILITOT 0.7 08/14/2022   Lab Results  Component Value Date   CREATININE 0.83 08/14/2022   BUN 17 08/14/2022   NA 141 08/14/2022   K 4.2 08/14/2022   CL 106 08/14/2022   CO2 25 08/14/2022   No results found for: "HGBA1C"  Home Medications    Current Outpatient Medications  Medication Sig Dispense Refill   acetaminophen (TYLENOL) 500 MG tablet Take 2 tablets (1,000 mg total) by mouth every 6 (six) hours as needed. 100 tablet 2   ALPRAZolam (XANAX) 0.5 MG tablet Take 0.5 mg by mouth daily as needed for anxiety or sleep.     apixaban (ELIQUIS) 5 MG TABS tablet TAKE 1 TABLET BY MOUTH TWICE  DAILY 180 tablet 1   buPROPion (WELLBUTRIN XL) 300 MG 24 hr tablet Take 300 mg by mouth daily.     Calcium-Magnesium-Vitamin D (CALCIUM 1200+D3 PO)      Cholecalciferol (VITAMIN D3) 125 MCG (5000 UT) CAPS Take 5,000 Units by mouth daily.     diclofenac Sodium (VOLTAREN ARTHRITIS PAIN) 1 % GEL Apply 2 g topically daily as needed (Arthritis  pain).     diltiazem (CARDIZEM CD) 120 MG 24 hr capsule TAKE 1 CAPSULE BY MOUTH  DAILY 90 capsule 3   furosemide (LASIX) 20 MG tablet Take 1 tablet every day by oral route as needed for 30 days.     levocetirizine (XYZAL) 5 MG tablet Take 5 mg by mouth every evening.     olopatadine (PATADAY) 0.1 % ophthalmic solution Place 1 drop into both eyes daily as needed for allergies.     omeprazole (PRILOSEC) 40 MG capsule Take 40 mg by mouth daily before breakfast.      ondansetron (ZOFRAN) 4 MG tablet Take 1 tablet (4 mg total) by mouth every 8 (eight) hours as needed for nausea or vomiting. 20 tablet 0   No current facility-administered medications for this visit.     Assessment & Plan    Hyperlipidemia Assessment: Patient with hyperlipidemia not at LDL goal of <  100 Most recent LDL 180 on 08/2022 Not able to tolerate statins secondary to myalgias, ezetimbe secondary to Gi distress Reviewed options for lowering LDL cholesterol, including PCSK-9 inhibitors, bempedoic acid and inclisiran.  Discussed mechanisms of action, dosing, side effects, potential decreases in LDL cholesterol and costs.  Also reviewed potential options for patient assistance Concern for high costs of medications once in coverage gap  Plan: Patient agreeable to starting PCSK9 inhibitor Repeat labs after:  3 months Lipid Liver function Patient was given information on [place] grant/signed up for [employer] while in office today.   Tommy Medal, PharmD CPP [facility] [address] [place]  [place], [place] [address] [phone]  12/01/2022, 4:25 PM

## 2022-12-01 ENCOUNTER — Ambulatory Visit: Payer: Medicare Other | Attending: Cardiology | Admitting: Pharmacist Clinician (PhC)/ Clinical Pharmacy Specialist

## 2022-12-01 ENCOUNTER — Encounter: Payer: Self-pay | Admitting: Pharmacist Clinician (PhC)/ Clinical Pharmacy Specialist

## 2022-12-01 DIAGNOSIS — E7849 Other hyperlipidemia: Secondary | ICD-10-CM | POA: Diagnosis not present

## 2022-12-01 DIAGNOSIS — E785 Hyperlipidemia, unspecified: Secondary | ICD-10-CM | POA: Insufficient documentation

## 2022-12-01 NOTE — Patient Instructions (Signed)
Your Results:             Your most recent labs Goal  Total Cholesterol 250 < 200  Triglycerides 141 < 150  HDL (happy/good cholesterol) 44 > 40  LDL (lousy/bad cholesterol 180 < 100   Medication changes:  We will start the process to get Repatha covered by your insurance.  Once the prior authorization is complete, I will send a MyChart message to let you know and confirm pharmacy information.   You will take one injection every 14 days.    Lab orders:  We want to repeat labs after 2-3 months.  We will send you a lab order to remind you once we get closer to that time.    Patient Assistance:  The [employer] foundation offers assistance to help pay for medication copays.  They will cover copays for all cholesterol lowering meds, including statins, fibrates, omega-3 oils, ezetimibe, Repatha, Praluent, Nexletol, Nexlizet.  The cards are usually good for $2,500 or 12 months, whichever comes first. Go to healthwellfoundation.org Click on "Apply Now" Answer questions as to whom is applying (patient or representative) Your disease fund will be "hypercholesterolemia - Medicare access" Select the cholesterol medication you need assistance with (Repatha, Praluent, Nexlizet...) They will ask question about qualifying diagnosis - you can mark "yes"; and do you have insurance coverage.   When they ask what type of assistance you are interested in - "copay assistance" When you submit, the approval is usually within minutes.  You will need to print the card information from the site You will need to show this information to your pharmacy, they will bill your Medicare Part D plan first -then bill [employer] Well --for the copay.   You can also call them at [phone], although the hold times can be quite long.   Thank you for choosing [facility]

## 2022-12-01 NOTE — Assessment & Plan Note (Signed)
Assessment: Patient with hyperlipidemia not at LDL goal of < 100 Most recent LDL 180 on 08/2022 Not able to tolerate statins secondary to myalgias, ezetimbe secondary to Gi distress Reviewed options for lowering LDL cholesterol, including PCSK-9 inhibitors, bempedoic acid and inclisiran.  Discussed mechanisms of action, dosing, side effects, potential decreases in LDL cholesterol and costs.  Also reviewed potential options for patient assistance Concern for high costs of medications once in coverage gap  Plan: Patient agreeable to starting PCSK9 inhibitor Repeat labs after:  3 months Lipid Liver function Patient was given information on [place] grant/signed up for [employer] while in office today.

## 2022-12-10 ENCOUNTER — Encounter: Payer: Self-pay | Admitting: Pharmacist Clinician (PhC)/ Clinical Pharmacy Specialist

## 2022-12-11 MED ORDER — REPATHA SURECLICK 140 MG/ML ~~LOC~~ SOAJ: 140.0000 mg | SUBCUTANEOUS | 12 refills | Status: DC

## 2022-12-23 DIAGNOSIS — K5792 Diverticulitis of intestine, part unspecified, without perforation or abscess without bleeding: Secondary | ICD-10-CM | POA: Diagnosis not present

## 2022-12-25 DIAGNOSIS — K5792 Diverticulitis of intestine, part unspecified, without perforation or abscess without bleeding: Secondary | ICD-10-CM | POA: Diagnosis not present

## 2022-12-26 DIAGNOSIS — K5792 Diverticulitis of intestine, part unspecified, without perforation or abscess without bleeding: Secondary | ICD-10-CM | POA: Diagnosis not present

## 2023-01-05 ENCOUNTER — Ambulatory Visit (HOSPITAL_COMMUNITY)
Admission: RE | Admit: 2023-01-05 | Discharge: 2023-01-05 | Disposition: A | Discharge status: Home / Self Care | Payer: Medicare Other | Source: Ambulatory Visit | Attending: Hematology | Admitting: Hematology

## 2023-01-05 ENCOUNTER — Inpatient Hospital Stay: Payer: Medicare Other | Attending: Hematology

## 2023-01-05 DIAGNOSIS — Z801 Family history of malignant neoplasm of trachea, bronchus and lung: Secondary | ICD-10-CM | POA: Diagnosis not present

## 2023-01-05 DIAGNOSIS — N329 Bladder disorder, unspecified: Secondary | ICD-10-CM | POA: Insufficient documentation

## 2023-01-05 DIAGNOSIS — Z87891 Personal history of nicotine dependence: Secondary | ICD-10-CM | POA: Diagnosis not present

## 2023-01-05 DIAGNOSIS — Z853 Personal history of malignant neoplasm of breast: Secondary | ICD-10-CM | POA: Diagnosis not present

## 2023-01-05 DIAGNOSIS — Z8049 Family history of malignant neoplasm of other genital organs: Secondary | ICD-10-CM | POA: Insufficient documentation

## 2023-01-05 DIAGNOSIS — Z1231 Encounter for screening mammogram for malignant neoplasm of breast: Secondary | ICD-10-CM | POA: Insufficient documentation

## 2023-01-05 DIAGNOSIS — C50911 Malignant neoplasm of unspecified site of right female breast: Secondary | ICD-10-CM | POA: Insufficient documentation

## 2023-01-05 DIAGNOSIS — Z79811 Long term (current) use of aromatase inhibitors: Secondary | ICD-10-CM | POA: Diagnosis not present

## 2023-01-05 DIAGNOSIS — M8580 Other specified disorders of bone density and structure, unspecified site: Secondary | ICD-10-CM | POA: Diagnosis not present

## 2023-01-05 LAB — CBC WITH DIFFERENTIAL/PLATELET
Abs Immature Granulocytes: 0.03 10*3/uL (ref 0.00–0.07)
Basophils Absolute: 0.1 10*3/uL (ref 0.0–0.1)
Basophils Relative: 2 %
Eosinophils Absolute: 0.2 10*3/uL (ref 0.0–0.5)
Eosinophils Relative: 4 %
HCT: 40.4 % (ref 36.0–46.0)
Hemoglobin: 13.2 g/dL (ref 12.0–15.0)
Immature Granulocytes: 1 %
Lymphocytes Relative: 25 %
Lymphs Abs: 1.4 10*3/uL (ref 0.7–4.0)
MCH: 31 pg (ref 26.0–34.0)
MCHC: 32.7 g/dL (ref 30.0–36.0)
MCV: 94.8 fL (ref 80.0–100.0)
Monocytes Absolute: 0.5 10*3/uL (ref 0.1–1.0)
Monocytes Relative: 10 %
Neutro Abs: 3.4 10*3/uL (ref 1.7–7.7)
Neutrophils Relative %: 58 %
Platelets: 224 10*3/uL (ref 150–400)
RBC: 4.26 MIL/uL (ref 3.87–5.11)
RDW: 13.3 % (ref 11.5–15.5)
WBC: 5.7 10*3/uL (ref 4.0–10.5)
nRBC: 0 % (ref 0.0–0.2)

## 2023-01-05 LAB — COMPREHENSIVE METABOLIC PANEL
ALT: 14 U/L (ref 0–44)
AST: 18 U/L (ref 15–41)
Albumin: 3.9 g/dL (ref 3.5–5.0)
Alkaline Phosphatase: 72 U/L (ref 38–126)
Anion gap: 9 (ref 5–15)
BUN: 19 mg/dL (ref 8–23)
CO2: 26 mmol/L (ref 22–32)
Calcium: 8.9 mg/dL (ref 8.9–10.3)
Chloride: 104 mmol/L (ref 98–111)
Creatinine, Ser: 0.82 mg/dL (ref 0.44–1.00)
GFR, Estimated: >60 mL/min (ref >60)
Glucose, Bld: 93 mg/dL (ref 70–99)
Potassium: 4.4 mmol/L (ref 3.5–5.1)
Sodium: 139 mmol/L (ref 135–145)
Total Bilirubin: 0.2 mg/dL — ABNORMAL LOW (ref 0.3–1.2)
Total Protein: 7 g/dL (ref 6.5–8.1)

## 2023-01-07 DIAGNOSIS — N2889 Other specified disorders of kidney and ureter: Secondary | ICD-10-CM | POA: Diagnosis not present

## 2023-01-07 DIAGNOSIS — N3289 Other specified disorders of bladder: Secondary | ICD-10-CM | POA: Insufficient documentation

## 2023-01-07 DIAGNOSIS — R9341 Abnormal radiologic findings on diagnostic imaging of renal pelvis, ureter, or bladder: Secondary | ICD-10-CM | POA: Diagnosis not present

## 2023-01-07 LAB — MISC LABCORP TEST (SEND OUT): Labcorp test code: 81950

## 2023-01-12 ENCOUNTER — Other Ambulatory Visit: Payer: Self-pay | Admitting: Cardiology

## 2023-01-12 ENCOUNTER — Encounter: Payer: Self-pay | Admitting: Hematology

## 2023-01-12 ENCOUNTER — Inpatient Hospital Stay: Payer: Medicare Other | Admitting: Hematology

## 2023-01-12 VITALS — BP 142/71 | HR 61 | Temp 97.4°F | Resp 18 | Wt 173.0 lb

## 2023-01-12 DIAGNOSIS — M8580 Other specified disorders of bone density and structure, unspecified site: Secondary | ICD-10-CM | POA: Diagnosis not present

## 2023-01-12 DIAGNOSIS — Z79811 Long term (current) use of aromatase inhibitors: Secondary | ICD-10-CM | POA: Diagnosis not present

## 2023-01-12 DIAGNOSIS — Z87891 Personal history of nicotine dependence: Secondary | ICD-10-CM | POA: Diagnosis not present

## 2023-01-12 DIAGNOSIS — C50911 Malignant neoplasm of unspecified site of right female breast: Secondary | ICD-10-CM

## 2023-01-12 DIAGNOSIS — Z801 Family history of malignant neoplasm of trachea, bronchus and lung: Secondary | ICD-10-CM | POA: Diagnosis not present

## 2023-01-12 DIAGNOSIS — Z170 Estrogen receptor positive status [ER+]: Secondary | ICD-10-CM | POA: Diagnosis not present

## 2023-01-12 DIAGNOSIS — N329 Bladder disorder, unspecified: Secondary | ICD-10-CM | POA: Diagnosis not present

## 2023-01-12 MED ORDER — APIXABAN 5 MG PO TABS: 5.0000 mg | ORAL_TABLET | Freq: Two times a day (BID) | ORAL | 1 refills | Status: DC

## 2023-01-12 MED ORDER — DILTIAZEM HCL ER COATED BEADS 120 MG PO CP24: 120.0000 mg | ORAL_CAPSULE | Freq: Every day | ORAL | 1 refills | Status: DC

## 2023-01-12 NOTE — Telephone Encounter (Signed)
Diltiazem refilled as requested.    Eliquis request sent to anticoag.

## 2023-01-12 NOTE — Patient Instructions (Addendum)
[facility]  Discharge Instructions  You were seen and examined today by Dr. Delton Coombes.  Dr. Delton Coombes discussed your most recent lab work which revealed that everything looks good.  Dr. Delton Coombes is going to order CT pelvis before next appointment and mammogram to be done next year.  Follow-up as scheduled in 6 months with labs and scan.    Thank you for choosing [name] to provide your oncology and hematology care.   To afford each patient quality time with our provider, please arrive at least 15 minutes before your scheduled appointment time. You may need to reschedule your appointment if you arrive late (10 or more minutes). Arriving late affects you and other patients whose appointments are after yours.  Also, if you miss three or more appointments without notifying the office, you may be dismissed from the clinic at the provider's discretion.    Again, thank you for choosing [facility].  Our hope is that these requests will decrease the amount of time that you wait before being seen by our physicians.   If you have a lab appointment with the [place] please come in thru the Main Entrance and check in at the main information desk.           _____________________________________________________________  Should you have questions after your visit to [facility], please contact our office at [phone] and follow the prompts.  Our office hours are 8:00 a.m. to 4:30 p.m. Monday - Thursday and 8:00 a.m. to 2:30 p.m. Friday.  Please note that voicemails left after 4:00 p.m. may not be returned until the following business day.  We are closed weekends and all major holidays.  You do have access to a nurse 24-7, just call the main number to the clinic [phone] and do not press any options, hold on the line and a nurse will answer the phone.    For prescription refill requests, have your  pharmacy contact our office and allow 72 hours.    Masks are optional in the cancer centers. If you would like for your care team to wear a mask while they are taking care of you, please let them know. You may have one support person who is at least 74 years old accompany you for your appointments.

## 2023-01-12 NOTE — Progress Notes (Signed)
[facility] [address] [place] [address]    Clinic Day:  01/12/2023  Referring physician: Celene Squibb, MD  Patient Care Team: Celene Squibb, MD as PCP - General (Internal Medicine) Satira Sark, MD as PCP - Cardiology (Cardiology) Danie Binder, MD (Inactive) as Consulting Physician (Gastroenterology)   ASSESSMENT & PLAN:   Assessment: 1.  Stage Ia right breast IDC: -Status post lumpectomy and SLNB on 04/30/2017 in [place].  Completed adjuvant radiation on 07/08/2017. - Anastrozole started around 06/03/2017, discontinued secondary to side effects after taking 6 months.  Femara was also discontinued secondary to dry vaginitis within a few weeks. -Right breast biopsy on 10/12/2018 shows scant benign adipose tissue with fibrosis with no malignancy. -Mammogram on 10/18/2019 was BI-RADS Category 2.   2.  Osteopenia: -Bone density on 08/21/2017 shows T score -1.1. - DEXA scan (09/10/2021): T-score -1.5      Plan: 1.  Stage Ia right breast IDC: - She has taken anastrozole for 4 months and letrozole for few weeks.  She could not tolerate them. - We reviewed mammogram from 01/05/2023 with BI-RADS Category 1. - LFTs and CBC were normal. - No palpable adenopathy on physical exam. - Will plan on repeating mammogram next February.    2.  Osteopenia: - She was taking vitamin D 10,000 units daily.  Vitamin D level on 01/05/2023 was 83.7.  She cut back to 5000 units daily after seeing the level.  Will check vitamin D level at next labs.  3.  Left anterior bladder wall mass: - CTAP (12/26/2022): At [facility] reviewed by me shows 1.1 cm hyperattenuating lesion imperceptible from the left anterior bladder wall, which may represent an exophytic mass versus remnant urachal cyst versus calcified lymph node. - She had a cystoscopy with Dr. Gaynelle Arabian which was normal. - She did not have any hematuria since then. - We will do a CT pelvis with contrast  in 6 months to follow-up on the left anterior bladder mass.  She was an ex-smoker, smoked 35 pack years who quit in 2000.   Breast Cancer therapy associated bone loss: I have recommended calcium, Vitamin D and weight bearing exercises.  Orders Placed This Encounter  Procedures   CT Pelvis W Contrast    Standing Status:   Future    Standing Expiration Date:   01/12/2024    Order Specific Question:   If indicated for the ordered procedure, I authorize the administration of contrast media per Radiology protocol    Answer:   Yes    Order Specific Question:   Does the patient have a contrast media/X-ray dye allergy?    Answer:   No    Order Specific Question:   Preferred imaging location?    Answer:   [facility]    Order Specific Question:   Release to patient    Answer:   Immediate [1]    Order Specific Question:   Is Oral Contrast requested for this exam?    Answer:   Yes, Per Radiology protocol   MM 3D SCREEN BREAST BILATERAL    Standing Status:   Future    Standing Expiration Date:   01/13/2024    Order Specific Question:   Reason for Exam (SYMPTOM  OR DIAGNOSIS REQUIRED)    Answer:   screening for breast cancer    Order Specific Question:   Preferred imaging location?    Answer:   [facility]  Order Specific Question:   Release to patient    Answer:   Immediate   CBC with Differential/Platelet    Standing Status:   Future    Standing Expiration Date:   01/13/2024    Order Specific Question:   Release to patient    Answer:   Immediate   Comprehensive metabolic panel    Standing Status:   Future    Standing Expiration Date:   01/13/2024    Order Specific Question:   Release to patient    Answer:   Immediate   VITAMIN D 25 Hydroxy (Vit-D Deficiency, Fractures)    Standing Status:   Future    Standing Expiration Date:   01/13/2024    Order Specific Question:   Release to patient    Answer:   Immediate      Kaleen Odea as a scribe for Derek Jack, MD.,have documented all relevant documentation on the behalf of Derek Jack, MD,as directed by  Derek Jack, MD while in the presence of Derek Jack, MD.   I, Derek Jack MD, have reviewed the above documentation for accuracy and completeness, and I agree with the above.   Doyce Loose   2/26/20244:14 PM  CHIEF COMPLAINT:   Diagnosis: right breast cancer     Cancer Staging  No matching staging information was found for the patient.   Prior Therapy: None  Current Therapy:  None   HISTORY OF PRESENT ILLNESS:   Oncology History   No history exists.     INTERVAL HISTORY:   Kathy Evans is a 72 y.o. female presenting to clinic today for follow up of right breast cancer  . She was last seen by me on 01/13/2022.  Today, she states that she is doing well overall. Her appetite level is at 100%. Her energy level is at 75%. She was taking 10,000 units of VitaminD but she cut it down by half. She also takes calcium. She denies new pain but has occasional pain that's the same as before.She has a mass in her bladder.    PAST MEDICAL HISTORY:   Past Medical History: Past Medical History:  Diagnosis Date   Arthritis    Atrial flutter [facility])    Diagnosed October 2021   Breast cancer ([place]) 03/2017   Right   Bronchitis    Depression    GERD (gastroesophageal reflux disease)    Heart murmur    Hyperlipidemia    IBS (irritable bowel syndrome)    Impingement syndrome of left shoulder    Personal history of radiation therapy 2018    Surgical History: Past Surgical History:  Procedure Laterality Date   ADENOIDECTOMY     APPENDECTOMY     BREAST BIOPSY Left    benign   BREAST LUMPECTOMY Right 2018   BUNIONECTOMY Right    CARDIAC CATHETERIZATION     CATARACT EXTRACTION, BILATERAL     CHOLECYSTECTOMY     colon adhesion     COLONOSCOPY WITH PROPOFOL N/A 02/02/2018   Dr. Oneida Alar: multiple small and large-mouthed diverticula in  recto-sigmoid colon, sigmoid, and descending. TI normal. Internal hemorrhoids during retroflexion, small. Moderate external hemorrhoids   Full mastectomy Right    MASTECTOMY, PARTIAL Right    PROLAPSED UTERINE FIBROID LIGATION     REPLACEMENT TOTAL KNEE Right 05/30/2022   TOE SURGERY Left    TONSILLECTOMY     TOTAL KNEE ARTHROPLASTY Right 05/30/2022   Procedure: TOTAL KNEE ARTHROPLASTY;  Surgeon: Earlie Server, MD;  Location: [facility];  Service: Orthopedics;  Laterality: Right;   TUBAL LIGATION     VAGINAL HYSTERECTOMY     VESICOVAGINAL FISTULA CLOSURE      Social History: Social History   Socioeconomic History   Marital status: Married    Spouse name: Not on file   Number of children: Not on file   Years of education: Not on file   Highest education level: Not on file  Occupational History   Occupation: retired  Tobacco Use   Smoking status: Former    Packs/day: 1.50    Years: 35.00    Total pack years: 52.50    Types: Cigarettes    Quit date: 2000    Years since quitting: 24.1   Smokeless tobacco: Never  Vaping Use   Vaping Use: Never used  Substance and Sexual Activity   Alcohol use: Yes    Comment: very seldom   Drug use: No   Sexual activity: Not on file    Comment: hyst  Other Topics Concern   Not on file  Social History Narrative   Not on file   Social Determinants of Health   Financial Resource Strain: Low Risk  (12/11/2020)   Overall Financial Resource Strain (CARDIA)    Difficulty of Paying Living Expenses: Not hard at all  Food Insecurity: No Food Insecurity (12/11/2020)   Hunger Vital Sign    Worried About Running Out of Food in the Last Year: Never true    [place] in the Last Year: Never true  Transportation Needs: No Transportation Needs (12/11/2020)   PRAPARE - Hydrologist (Medical): No    Lack of Transportation (Non-Medical): No  Physical Activity: Insufficiently Active (12/11/2020)   Exercise Vital Sign     Days of Exercise per Week: 2 days    Minutes of Exercise per Session: 30 min  Stress: No Stress Concern Present (12/11/2020)   [place]    Feeling of Stress : Only a little  Social Connections: Socially Integrated (12/11/2020)   Social Connection and Isolation Panel [NHANES]    Frequency of Communication with Friends and Family: Three times a week    Frequency of Social Gatherings with Friends and Family: Three times a week    Attends Religious Services: More than 4 times per year    Active Member of Clubs or Organizations: Yes    Attends Archivist Meetings: More than 4 times per year    Marital Status: Married  Human resources officer Violence: Not At Risk (12/11/2020)   Humiliation, Afraid, Rape, and Kick questionnaire    Fear of Current or Ex-Partner: No    Emotionally Abused: No    Physically Abused: No    Sexually Abused: No    Family History: Family History  Problem Relation Age of Onset   Diabetes Maternal Grandmother    Alzheimer's disease Maternal Grandmother    Heart disease Father    Cancer Mother        uterine   Diabetes Mother    Heart disease Brother    Diabetes Brother    Cancer Sister        lung   Diabetes Sister    Heart disease Sister        had stent placed   Heart disease Brother    Cancer Sister        uterine   Diabetes Sister    High blood pressure Sister  Colon cancer Neg Hx     Current Medications:  Current Outpatient Medications:    acetaminophen (TYLENOL) 500 MG tablet, Take 2 tablets (1,000 mg total) by mouth every 6 (six) hours as needed., Disp: 100 tablet, Rfl: 2   ALPRAZolam (XANAX) 0.5 MG tablet, Take 0.5 mg by mouth daily as needed for anxiety or sleep., Disp: , Rfl:    apixaban (ELIQUIS) 5 MG TABS tablet, Take 1 tablet (5 mg total) by mouth 2 (two) times daily., Disp: 200 tablet, Rfl: 1   buPROPion (WELLBUTRIN XL) 300 MG 24 hr tablet, Take 300 mg by mouth  daily., Disp: , Rfl:    Calcium-Magnesium-Vitamin D (CALCIUM 1200+D3 PO), , Disp: , Rfl:    Cholecalciferol (VITAMIN D3) 125 MCG (5000 UT) CAPS, Take 5,000 Units by mouth daily., Disp: , Rfl:    diclofenac Sodium (VOLTAREN ARTHRITIS PAIN) 1 % GEL, Apply 2 g topically daily as needed (Arthritis pain)., Disp: , Rfl:    diltiazem (CARDIZEM CD) 120 MG 24 hr capsule, Take 1 capsule (120 mg total) by mouth daily., Disp: 100 capsule, Rfl: 1   Evolocumab (REPATHA SURECLICK) [id number] MG/ML SOAJ, Inject 140 mg into the skin every 14 (fourteen) days., Disp: 2 mL, Rfl: 12   furosemide (LASIX) 20 MG tablet, Take 1 tablet every day by oral route as needed for 30 days., Disp: , Rfl:    levocetirizine (XYZAL) 5 MG tablet, Take 5 mg by mouth every evening., Disp: , Rfl:    olopatadine (PATADAY) 0.1 % ophthalmic solution, Place 1 drop into both eyes daily as needed for allergies., Disp: , Rfl:    omeprazole (PRILOSEC) 40 MG capsule, Take 40 mg by mouth daily before breakfast. , Disp: , Rfl:    ondansetron (ZOFRAN) 4 MG tablet, Take 1 tablet (4 mg total) by mouth every 8 (eight) hours as needed for nausea or vomiting., Disp: 20 tablet, Rfl: 0   Allergies: Allergies  Allergen Reactions   Statins Other (See Comments)    PT STATES SHE HAS BEEN MOST STATINS IN THE PAST AND HAS GREAT MUSCLE AND JOINT PAIN.Marland Kitchen  Patient has joint pain  Patient has joint pain     Tape Itching    Red and itchy    REVIEW OF SYSTEMS:   Review of Systems  Constitutional:  Negative for chills, fatigue and fever.  HENT:   Negative for lump/mass, mouth sores, nosebleeds, sore throat and trouble swallowing.   Eyes:  Negative for eye problems.  Respiratory:  Negative for cough and shortness of breath.   Cardiovascular:  Negative for chest pain, leg swelling and palpitations.  Gastrointestinal:  Negative for abdominal pain, constipation, diarrhea, nausea and vomiting.  Genitourinary:  Negative for bladder incontinence, difficulty urinating,  dysuria, frequency, hematuria and nocturia.   Musculoskeletal:  Negative for arthralgias, back pain, flank pain, myalgias and neck pain.  Skin:  Negative for itching and rash.  Neurological:  Negative for dizziness, headaches and numbness.  Hematological:  Does not bruise/bleed easily.  Psychiatric/Behavioral:  Negative for depression, sleep disturbance and suicidal ideas. The patient is not nervous/anxious.   All other systems reviewed and are negative.    VITALS:   Blood pressure (!) 142/71, pulse 61, temperature (!) 97.4 F (36.3 C), temperature source Oral, resp. rate 18, weight 173 lb (78.5 kg), SpO2 100 %.  Wt Readings from Last 3 Encounters:  01/12/23 173 lb (78.5 kg)  11/06/22 170 lb (77.1 kg)  09/03/22 173 lb 12.8 oz (78.8 kg)  Body mass index is 30.65 kg/m.  Performance status (ECOG): 1 - Symptomatic but completely ambulatory  PHYSICAL EXAM:   Physical Exam Vitals and nursing note reviewed. Exam conducted with a chaperone present.  Constitutional:      Appearance: Normal appearance.  Cardiovascular:     Rate and Rhythm: Normal rate and regular rhythm.     Pulses: Normal pulses.     Heart sounds: Normal heart sounds.  Pulmonary:     Effort: Pulmonary effort is normal.     Breath sounds: Normal breath sounds.  Abdominal:     Palpations: Abdomen is soft. There is no hepatomegaly, splenomegaly or mass.     Tenderness: There is no abdominal tenderness.  Musculoskeletal:     Right lower leg: No edema.     Left lower leg: No edema.  Lymphadenopathy:     Cervical: No cervical adenopathy.     Right cervical: No superficial, deep or posterior cervical adenopathy.    Left cervical: No superficial, deep or posterior cervical adenopathy.     Upper Body:     Right upper body: No supraclavicular or axillary adenopathy.     Left upper body: No supraclavicular or axillary adenopathy.  Neurological:     General: No focal deficit present.     Mental Status: She is alert  and oriented to person, place, and time.  Psychiatric:        Mood and Affect: Mood normal.        Behavior: Behavior normal.     LABS:      Latest Ref Rng & Units 01/05/2023    1:16 PM 08/14/2022    7:24 PM 05/31/2022    3:34 AM  CBC  WBC 4.0 - 10.5 K/uL 5.7  14.8  11.3   Hemoglobin 12.0 - 15.0 g/dL 13.2  14.8  11.9   Hematocrit 36.0 - 46.0 % 40.4  44.9  37.4   Platelets 150 - 400 K/uL 224  248  163       Latest Ref Rng & Units 01/05/2023    1:16 PM 08/14/2022    9:01 PM 05/31/2022    3:34 AM  CMP  Glucose 70 - 99 mg/dL 93  121  123   BUN 8 - 23 mg/dL '19  17  11   '$ Creatinine 0.44 - 1.00 mg/dL 0.82  0.83  0.64   Sodium 135 - 145 mmol/L 139  141  141   Potassium 3.5 - 5.1 mmol/L 4.4  4.2  4.0   Chloride 98 - 111 mmol/L 104  106  109   CO2 22 - 32 mmol/L '26  25  23   '$ Calcium 8.9 - 10.3 mg/dL 8.9  9.4  8.7   Total Protein 6.5 - 8.1 g/dL 7.0  6.9    Total Bilirubin 0.3 - 1.2 mg/dL 0.2  0.7    Alkaline Phos 38 - 126 U/L 72  79    AST 15 - 41 U/L 18  14    ALT 0 - 44 U/L 14  13       No results found for: "CEA1", "CEA" / No results found for: "CEA1", "CEA" No results found for: "PSA1" No results found for: "[id number]" No results found for: "CAN125"  No results found for: "TOTALPROTELP", "ALBUMINELP", "A1GS", "A2GS", "BETS", "BETA2SER", "GAMS", "MSPIKE", "SPEI" No results found for: "TIBC", "FERRITIN", "IRONPCTSAT" No results found for: "LDH"   STUDIES:   MM 3D SCREEN BREAST BILATERAL  Result Date: 01/07/2023 CLINICAL DATA:  Screening. EXAM: DIGITAL SCREENING BILATERAL MAMMOGRAM WITH TOMOSYNTHESIS AND CAD TECHNIQUE: Bilateral screening digital craniocaudal and mediolateral oblique mammograms were obtained. Bilateral screening digital breast tomosynthesis was performed. The images were evaluated with computer-aided detection. COMPARISON:  Previous exam(s). ACR Breast Density Category b: There are scattered areas of fibroglandular density. FINDINGS: There are no findings  suspicious for malignancy. IMPRESSION: No mammographic evidence of malignancy. A result letter of this screening mammogram will be mailed directly to the patient. RECOMMENDATION: Screening mammogram in one year. (Code:SM-B-01Y) BI-RADS CATEGORY  1: Negative. Electronically Signed   By: Abelardo Diesel M.D.   On: 01/07/2023 10:36

## 2023-01-12 NOTE — Telephone Encounter (Signed)
*  STAT* If patient is at the pharmacy, call can be transferred to refill team.   1. Which medications need to be refilled? (please list name of each medication and dose if known) apixaban (ELIQUIS) 5 MG TABS tablet   diltiazem (CARDIZEM CD) 120 MG 24 hr capsule   2. Which pharmacy/location (including street and city if local pharmacy) is medication to be sent to? [facility] ([place], [place]   3. Do they need a 30 day or 90 day supply? 100  Patient insurance is going to give her 10 days worth of pill but the refill have to be for 100 day supply

## 2023-01-19 DIAGNOSIS — Z961 Presence of intraocular lens: Secondary | ICD-10-CM | POA: Diagnosis not present

## 2023-01-19 DIAGNOSIS — H524 Presbyopia: Secondary | ICD-10-CM | POA: Diagnosis not present

## 2023-02-24 DIAGNOSIS — M8580 Other specified disorders of bone density and structure, unspecified site: Secondary | ICD-10-CM | POA: Diagnosis not present

## 2023-02-24 DIAGNOSIS — E559 Vitamin D deficiency, unspecified: Secondary | ICD-10-CM | POA: Diagnosis not present

## 2023-02-24 DIAGNOSIS — E782 Mixed hyperlipidemia: Secondary | ICD-10-CM | POA: Diagnosis not present

## 2023-03-02 ENCOUNTER — Encounter: Payer: Self-pay | Admitting: Internal Medicine

## 2023-03-02 DIAGNOSIS — M8580 Other specified disorders of bone density and structure, unspecified site: Secondary | ICD-10-CM | POA: Diagnosis not present

## 2023-03-02 DIAGNOSIS — Z853 Personal history of malignant neoplasm of breast: Secondary | ICD-10-CM | POA: Diagnosis not present

## 2023-03-02 DIAGNOSIS — I480 Paroxysmal atrial fibrillation: Secondary | ICD-10-CM | POA: Diagnosis not present

## 2023-03-02 DIAGNOSIS — Z0000 Encounter for general adult medical examination without abnormal findings: Secondary | ICD-10-CM | POA: Diagnosis not present

## 2023-03-02 DIAGNOSIS — N3289 Other specified disorders of bladder: Secondary | ICD-10-CM | POA: Diagnosis not present

## 2023-03-02 DIAGNOSIS — K219 Gastro-esophageal reflux disease without esophagitis: Secondary | ICD-10-CM | POA: Diagnosis not present

## 2023-03-02 DIAGNOSIS — E782 Mixed hyperlipidemia: Secondary | ICD-10-CM | POA: Diagnosis not present

## 2023-03-02 DIAGNOSIS — K581 Irritable bowel syndrome with constipation: Secondary | ICD-10-CM | POA: Diagnosis not present

## 2023-03-02 DIAGNOSIS — M179 Osteoarthritis of knee, unspecified: Secondary | ICD-10-CM | POA: Diagnosis not present

## 2023-03-02 DIAGNOSIS — M25551 Pain in right hip: Secondary | ICD-10-CM | POA: Diagnosis not present

## 2023-03-09 ENCOUNTER — Encounter: Payer: Self-pay | Admitting: Cardiology

## 2023-03-12 ENCOUNTER — Telehealth: Payer: Self-pay | Admitting: Cardiology

## 2023-03-12 NOTE — Telephone Encounter (Signed)
     Pre-operative Risk Assessment    Patient Name: Kathy Evans  DOB: February 10, 1951 MRN: [id number]      Request for Surgical Clearance    Procedure:   EGD and colonoscopy   Date of Surgery:  Clearance 03/19/23                                 Surgeon: Dr. Briant Sites Surgeon's Group or Practice Name:  [facility] Phone number:  [phone] or [phone] Fax number:  [phone]   Type of Clearance Requested:   - Medical  - Pharmacy:  Hold Apixaban (Eliquis) 2 days or so   Type of Anesthesia:  MAC   Additional requests/questions:    Signed, Noe Gens   03/12/2023, 11:03 AM

## 2023-03-12 NOTE — Telephone Encounter (Signed)
   Name: Kathy Evans  DOB: Jun 15, 1951  MRN: [id number]  Primary Cardiologist: Nona Dell, MD  Chart reviewed as part of pre-operative protocol coverage. The patient has an upcoming visit scheduled with Dr. Diona Browner on 03/17/2023 at which time clearance can be addressed in case there are any issues that would impact surgical recommendations.  EGD/colonoscopy is not scheduled until 03/19/2023 as below. I added preop FYI to appointment note so that provider is aware to address at time of outpatient visit.  Per office protocol the cardiology provider should forward their finalized clearance decision and recommendations regarding antiplatelet therapy to the requesting party below.    This message will also be routed to pharmacy pool  for input on holding Eliquis as requested below so that this information is available to the clearing provider at time of patient's appointment.   I will route this message as FYI to requesting party and remove this message from the preop box as separate preop APP input not needed at this time.   Please call with any questions.  Joylene Grapes, NP  03/12/2023, 12:55 PM

## 2023-03-12 NOTE — Telephone Encounter (Signed)
Patient with diagnosis of aflutter on Eliquis for anticoagulation.    Procedure: EGD and colonoscopy Date of procedure: 03/19/23  CHA2DS2-VASc Score = 2  This indicates a 2.2% annual risk of stroke. The patient's score is based upon: CHF History: 0 HTN History: 0 Diabetes History: 0 Stroke History: 0 Vascular Disease History: 0 Age Score: 1 Gender Score: 1      CrCl 74mL/min using adj body weight Platelet count 237K  Per office protocol, patient can hold Eliquis for 2 days prior to procedure as requested.    **This guidance is not considered finalized until pre-operative APP has relayed final recommendations.**

## 2023-03-17 ENCOUNTER — Encounter: Payer: Self-pay | Admitting: Cardiology

## 2023-03-17 ENCOUNTER — Ambulatory Visit: Payer: Medicare Other | Attending: Cardiology | Admitting: Cardiology

## 2023-03-17 VITALS — BP 128/62 | HR 59 | Ht 64.0 in | Wt 175.0 lb

## 2023-03-17 DIAGNOSIS — E782 Mixed hyperlipidemia: Secondary | ICD-10-CM

## 2023-03-17 DIAGNOSIS — Z789 Other specified health status: Secondary | ICD-10-CM | POA: Diagnosis not present

## 2023-03-17 DIAGNOSIS — I4892 Unspecified atrial flutter: Secondary | ICD-10-CM

## 2023-03-17 NOTE — Progress Notes (Signed)
    Cardiology Office Note  Date: 03/17/2023   ID: Kathy Evans, DOB [birth year], MRN [id number]  History of Present Illness: Kathy Evans is a 72 y.o. female last seen in December 2023.  She is here today with her husband for a follow-up visit.  Reports no major sense of palpitations.  She has been doing leg exercises and also using an elliptical machine for 30 minutes at least twice a week.  Generally feels better.  She is scheduled for EGD and colonoscopy on Thursday and stopped Eliquis today (48 hours prior).  She continues on Cardizem CD otherwise.  Also tolerating Repatha.  Physical Exam: VS:  BP 128/62   Pulse (!) 59   Ht 5\' 4"  (1.626 m)   Wt 175 lb (79.4 kg)   SpO2 98%   BMI 30.04 kg/m , BMI Body mass index is 30.04 kg/m.  Wt Readings from Last 3 Encounters:  03/17/23 175 lb (79.4 kg)  01/12/23 173 lb (78.5 kg)  11/06/22 170 lb (77.1 kg)    General: Patient appears comfortable at rest. HEENT: Conjunctiva and lids normal. Neck: Supple, no elevated JVP or carotid bruits. Lungs: Clear to auscultation, nonlabored breathing at rest. Cardiac: Regular rate and rhythm, no S3 or significant systolic murmur.  ECG:  An ECG dated 05/21/2022 was personally reviewed today and demonstrated:  Sinus bradycardia with nonspecific ST-T changes.  Labwork: 01/05/2023: ALT 14; AST 18; BUN 19; Creatinine, Ser 0.82; Hemoglobin 13.2; Platelets 224; Potassium 4.4; Sodium 139  April 2024: Hemoglobin A 5.5%, cholesterol 190, triglycerides 95, HDL 54, LDL 119, BUN 17, creatinine 0.83, potassium 4.4, AST 18, AST 16, hemoglobin 13.0, platelets 237  Other Studies Reviewed Today:  Echocardiogram 11/14/2022:  1. Left ventricular ejection fraction, by estimation, is 60 to 65%. The  left ventricle has normal function. The left ventricle has no regional  wall motion abnormalities. Left ventricular diastolic parameters were  normal.   2. Right ventricular systolic function is normal. The right  ventricular  size is normal.   3. Left atrial size was moderately dilated.   4. Right atrial size was mildly dilated.   5. The mitral valve is abnormal. Mild mitral valve regurgitation. No  evidence of mitral stenosis.   6. The aortic valve is tricuspid. There is mild calcification of the  aortic valve. There is mild thickening of the aortic valve. Aortic valve  regurgitation is mild. Aortic valve sclerosis is present, with no evidence  of aortic valve stenosis.   7. The inferior vena cava is normal in size with greater than 50%  respiratory variability, suggesting right atrial pressure of 3 mmHg.   Assessment and Plan:  1.  History of atrial flutter and intermittent PSVT with CHA2DS2-VASc score of 2.  She is symptomatically stable, heart rate regular today.  Continue Cardizem CD and Eliquis (temporarily on 48-hour hold prior to EGD/colonoscopy).  2.  Severe mixed hyperlipidemia with multi statin intolerance as well as intolerance to Zetia.  She has had evaluation in the lipid clinic and has started on Repatha.  LDL has come down from 180-119 so far.  She has only had 4 doses.  Continue same for now.  Disposition:  Follow up  6 months.  Signed, Jonelle Sidle, M.D., F.A.C.C. [facility] at [facility]

## 2023-03-17 NOTE — Patient Instructions (Addendum)

## 2023-03-20 DIAGNOSIS — Z87891 Personal history of nicotine dependence: Secondary | ICD-10-CM | POA: Diagnosis not present

## 2023-03-20 DIAGNOSIS — Z1211 Encounter for screening for malignant neoplasm of colon: Secondary | ICD-10-CM | POA: Diagnosis not present

## 2023-03-20 DIAGNOSIS — K449 Diaphragmatic hernia without obstruction or gangrene: Secondary | ICD-10-CM | POA: Diagnosis not present

## 2023-03-20 DIAGNOSIS — R197 Diarrhea, unspecified: Secondary | ICD-10-CM | POA: Diagnosis not present

## 2023-03-20 DIAGNOSIS — R9341 Abnormal radiologic findings on diagnostic imaging of renal pelvis, ureter, or bladder: Secondary | ICD-10-CM | POA: Diagnosis not present

## 2023-03-20 DIAGNOSIS — Z79899 Other long term (current) drug therapy: Secondary | ICD-10-CM | POA: Diagnosis not present

## 2023-03-20 DIAGNOSIS — R1032 Left lower quadrant pain: Secondary | ICD-10-CM | POA: Diagnosis not present

## 2023-03-20 DIAGNOSIS — K648 Other hemorrhoids: Secondary | ICD-10-CM | POA: Diagnosis not present

## 2023-03-20 DIAGNOSIS — Z7901 Long term (current) use of anticoagulants: Secondary | ICD-10-CM | POA: Diagnosis not present

## 2023-03-20 DIAGNOSIS — K5730 Diverticulosis of large intestine without perforation or abscess without bleeding: Secondary | ICD-10-CM | POA: Diagnosis not present

## 2023-05-05 DIAGNOSIS — K582 Mixed irritable bowel syndrome: Secondary | ICD-10-CM | POA: Diagnosis not present

## 2023-05-05 DIAGNOSIS — K5790 Diverticulosis of intestine, part unspecified, without perforation or abscess without bleeding: Secondary | ICD-10-CM | POA: Diagnosis not present

## 2023-05-29 DIAGNOSIS — M1711 Unilateral primary osteoarthritis, right knee: Secondary | ICD-10-CM | POA: Diagnosis not present

## 2023-06-18 ENCOUNTER — Other Ambulatory Visit: Payer: Self-pay | Admitting: Cardiology

## 2023-06-25 ENCOUNTER — Telehealth: Payer: Self-pay | Admitting: Cardiology

## 2023-06-25 ENCOUNTER — Encounter: Payer: Self-pay | Admitting: Cardiology

## 2023-06-25 NOTE — Telephone Encounter (Signed)
Patient called stating she is due today for her repatha shot, but she never had her repeat labs done, so the pharmacy won't refill her repatha.  She needs a lab order placed so she can have her labs done.

## 2023-06-25 NOTE — Telephone Encounter (Signed)
Last lipid panel was in April 2024. Called [facility] to get clarification on issue getting repatha and phone just rang. Will try again later.

## 2023-06-26 ENCOUNTER — Other Ambulatory Visit (HOSPITAL_COMMUNITY): Payer: Self-pay

## 2023-06-26 ENCOUNTER — Telehealth: Payer: Self-pay | Admitting: Pharmacy Technician

## 2023-06-26 NOTE — Telephone Encounter (Signed)
Pharmacy Patient Advocate Encounter   Received notification from CoverMyMeds that prior authorization for Repatha SureClick 140MG /ML auto-injectors is required/requested.   Insurance verification completed.   The patient is insured through [facility] .   Per test claim: PA required; PA submitted to [facility] via CoverMyMeds Key/confirmation #/EOC [id number] Status is pending

## 2023-06-26 NOTE — Telephone Encounter (Signed)
Approved today by [employer] Medicare 2017 NCPDP Request Reference Number: [id number]. REPATHA SURE INJ 140MG /ML is approved through 11/17/2023. Your patient may now fill this prescription and it will be covered.

## 2023-06-26 NOTE — Telephone Encounter (Signed)
Spoke to [place] at [employer] who stated that the patient needed a prior authorization for Repatha. Will check Cover My Meds for PA.

## 2023-06-29 ENCOUNTER — Other Ambulatory Visit (HOSPITAL_COMMUNITY): Payer: Self-pay

## 2023-06-29 NOTE — Telephone Encounter (Signed)
Pharmacy Patient Advocate Encounter   Received notification from CoverMyMeds that prior authorization for REPATHA 140MG /ML is required/requested.   Insurance verification completed.   The patient is insured through [facility] .   Per test claim: Refill too soon. PA is not needed at this time. Medication was filled --. Next eligible fill date is 07/17/2023.

## 2023-07-05 ENCOUNTER — Encounter (HOSPITAL_COMMUNITY): Payer: Self-pay | Admitting: Emergency Medicine

## 2023-07-05 ENCOUNTER — Other Ambulatory Visit: Payer: Self-pay

## 2023-07-05 ENCOUNTER — Encounter: Payer: Self-pay | Admitting: Cardiology

## 2023-07-05 ENCOUNTER — Emergency Department (HOSPITAL_COMMUNITY): Payer: Medicare Other

## 2023-07-05 ENCOUNTER — Emergency Department (HOSPITAL_COMMUNITY)
Admission: EM | Admit: 2023-07-05 | Discharge: 2023-07-05 | Disposition: A | Discharge status: Home / Self Care | Payer: Medicare Other | Attending: Emergency Medicine | Admitting: Emergency Medicine

## 2023-07-05 DIAGNOSIS — Z87891 Personal history of nicotine dependence: Secondary | ICD-10-CM | POA: Insufficient documentation

## 2023-07-05 DIAGNOSIS — Z96651 Presence of right artificial knee joint: Secondary | ICD-10-CM | POA: Diagnosis not present

## 2023-07-05 DIAGNOSIS — R002 Palpitations: Secondary | ICD-10-CM | POA: Diagnosis not present

## 2023-07-05 DIAGNOSIS — Z853 Personal history of malignant neoplasm of breast: Secondary | ICD-10-CM | POA: Insufficient documentation

## 2023-07-05 DIAGNOSIS — R009 Unspecified abnormalities of heart beat: Secondary | ICD-10-CM | POA: Diagnosis present

## 2023-07-05 DIAGNOSIS — I4891 Unspecified atrial fibrillation: Secondary | ICD-10-CM

## 2023-07-05 DIAGNOSIS — Z7901 Long term (current) use of anticoagulants: Secondary | ICD-10-CM | POA: Diagnosis not present

## 2023-07-05 LAB — COMPREHENSIVE METABOLIC PANEL
ALT: 18 U/L (ref 0–44)
AST: 17 U/L (ref 15–41)
Albumin: 3.8 g/dL (ref 3.5–5.0)
Alkaline Phosphatase: 75 U/L (ref 38–126)
Anion gap: 7 (ref 5–15)
BUN: 14 mg/dL (ref 8–23)
CO2: 25 mmol/L (ref 22–32)
Calcium: 9.1 mg/dL (ref 8.9–10.3)
Chloride: 106 mmol/L (ref 98–111)
Creatinine, Ser: 0.82 mg/dL (ref 0.44–1.00)
GFR, Estimated: >60 mL/min (ref >60)
Glucose, Bld: 91 mg/dL (ref 70–99)
Potassium: 3.7 mmol/L (ref 3.5–5.1)
Sodium: 138 mmol/L (ref 135–145)
Total Bilirubin: 0.4 mg/dL (ref 0.3–1.2)
Total Protein: 6.8 g/dL (ref 6.5–8.1)

## 2023-07-05 LAB — TROPONIN I (HIGH SENSITIVITY)
Troponin I (High Sensitivity): 9 ng/L (ref <18)
Troponin I (High Sensitivity): 9 ng/L (ref <18)

## 2023-07-05 LAB — CBC
HCT: 43 % (ref 36.0–46.0)
Hemoglobin: 14.1 g/dL (ref 12.0–15.0)
MCH: 30.9 pg (ref 26.0–34.0)
MCHC: 32.8 g/dL (ref 30.0–36.0)
MCV: 94.3 fL (ref 80.0–100.0)
Platelets: 219 10*3/uL (ref 150–400)
RBC: 4.56 MIL/uL (ref 3.87–5.11)
RDW: 13.2 % (ref 11.5–15.5)
WBC: 4.7 10*3/uL (ref 4.0–10.5)
nRBC: 0 % (ref 0.0–0.2)

## 2023-07-05 LAB — TSH: TSH: 2.267 u[IU]/mL (ref 0.350–4.500)

## 2023-07-05 LAB — MAGNESIUM: Magnesium: 2.1 mg/dL (ref 1.7–2.4)

## 2023-07-05 LAB — T4, FREE: Free T4: 0.9 ng/dL (ref 0.61–1.12)

## 2023-07-05 MED ORDER — METOPROLOL TARTRATE 5 MG/5ML IV SOLN
5.0000 mg | Freq: Once | INTRAVENOUS | Status: AC
  Administered 2023-07-05: 5 mg via INTRAVENOUS
  Filled 2023-07-05: qty 5

## 2023-07-05 MED ORDER — SODIUM CHLORIDE 0.9 % IV BOLUS
1000.0000 mL | Freq: Once | INTRAVENOUS | Status: AC
  Administered 2023-07-05: 1000 mL via INTRAVENOUS

## 2023-07-05 NOTE — ED Provider Notes (Signed)
[name] EMERGENCY DEPARTMENT AT [facility] Provider Note  CSN: [id number] Arrival date & time: 07/05/23 4401  Chief Complaint(s) Irregular Heartbeat  HPI Kathy Evans is a 72 y.o. female with past medical history as below, significant for aflutter (on eliquis), GERD, HLD, IBS, s/p TKR (r) who presents to the ED with complaint of irregular heart beat. Dr Diona Browner is her primary cardiologist. Missed eliquis yestd, takes dilt at bedtime w/o missed doses. Woke up around 0400 with chest tightness/pressure, felt "frozen." Checked heart rate on apple watch and was >140. No other complaints verbalized, felt okay when she went to bed last night.   Past Medical History Past Medical History:  Diagnosis Date   Arthritis    Atrial flutter 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 Authorizing Provider  ALPRAZolam Prudy Feeler) 0.5 MG tablet Take 0.25-0.5 mg by mouth daily as needed for anxiety or sleep.    Yes [provider]  apixaban (ELIQUIS) 5 MG TABS tablet Take 1 tablet (5 mg total) by mouth 2 (two) times daily. 01/12/23  Yes Jonelle Sidle, MD  buPROPion (WELLBUTRIN XL) 300 MG 24 hr tablet Take 300 mg by mouth daily.   Yes [provider]  Calcium Carbonate Antacid (TUMS PO) Take 1 tablet by mouth daily as needed (indigestion, heartburn, upset stomach).   Yes [provider]  diltiazem (CARDIZEM CD) 120 MG 24 hr capsule TAKE 1 CAPSULE BY MOUTH DAILY Patient taking differently: Take 120 mg by mouth every evening. 06/19/23  Yes Jonelle Sidle, MD  Evolocumab (REPATHA SURECLICK) 140 MG/ML SOAJ Inject 140 mg into the skin every 14 (fourteen) days. 12/11/22  Yes Jonelle Sidle, MD  furosemide (LASIX) 20 MG tablet Take 20 mg by mouth daily as needed for fluid. 10/15/22  Yes [provider]  levocetirizine (XYZAL) 5 MG tablet Take 5 mg by mouth every evening. 03/04/18  Yes [provider]  MAGNESIUM PO Take 1 tablet by mouth daily as needed (cramps).   Yes [provider]  olopatadine (PATADAY) 0.1 % ophthalmic solution Place 1 drop into both eyes daily as needed for allergies.   Yes [provider]  Past Surgical History Past Surgical History:  Procedure Laterality Date   ADENOIDECTOMY     APPENDECTOMY     BREAST BIOPSY Left    benign   BREAST LUMPECTOMY Right 2018   BUNIONECTOMY Right    CARDIAC CATHETERIZATION     CATARACT EXTRACTION, BILATERAL     CHOLECYSTECTOMY     colon adhesion     COLONOSCOPY WITH PROPOFOL N/A 02/02/2018   Dr. Darrick Penna: multiple small and large-mouthed diverticula in recto-sigmoid colon, sigmoid, and descending. TI normal. Internal hemorrhoids during retroflexion, small. Moderate external hemorrhoids   Full mastectomy Right    MASTECTOMY, PARTIAL Right    PROLAPSED  UTERINE FIBROID LIGATION     REPLACEMENT TOTAL KNEE Right 05/30/2022   TOE SURGERY Left    TONSILLECTOMY     TOTAL KNEE ARTHROPLASTY Right 05/30/2022   Procedure: TOTAL KNEE ARTHROPLASTY;  Surgeon: Frederico Hamman, MD;  Location: [facility];  Service: Orthopedics;  Laterality: Right;   TUBAL LIGATION     VAGINAL HYSTERECTOMY     VESICOVAGINAL FISTULA CLOSURE     Family History Family History  Problem Relation Age of Onset   Diabetes Maternal Grandmother    Alzheimer's disease Maternal Grandmother    Heart disease Father    Cancer Mother        uterine   Diabetes Mother    Heart disease Brother    Diabetes Brother    Cancer Sister        lung   Diabetes Sister    Heart disease Sister        had stent placed   Heart disease Brother    Cancer Sister        uterine   Diabetes Sister    High blood pressure Sister    Colon cancer Neg Hx     Social History Social History   Tobacco Use   Smoking status: Former    Current packs/day: 0.00    Average packs/day: 1.5 packs/day for 35.0 years (52.5 ttl pk-yrs)    Types: Cigarettes    Start date: 52    Quit date: 2000    Years since quitting: 24.6   Smokeless tobacco: Never  Vaping Use   Vaping status: Never Used  Substance Use Topics   Alcohol use: Yes    Comment: very seldom   Drug use: No   Allergies Statins and Tape  Review of Systems Review of Systems  Constitutional:  Negative for chills and fever.  HENT:  Negative for facial swelling and trouble swallowing.   Respiratory:  Positive for chest tightness. Negative for cough and shortness of breath.   Cardiovascular:  Positive for palpitations. Negative for chest pain.  Gastrointestinal:  Negative for abdominal pain, nausea and vomiting.  Endocrine: Negative for polydipsia and polyuria.  Genitourinary:  Negative for difficulty urinating and hematuria.  Musculoskeletal:  Negative for gait problem and joint swelling.  Skin:  Negative for pallor and rash.   Neurological:  Negative for syncope and headaches.  Psychiatric/Behavioral:  Negative for agitation and confusion.     Physical Exam Vital Signs  I have reviewed the triage vital signs BP 137/66   Pulse (!) 51   Temp 98 F (36.7 C) (Oral)   Resp 13   Ht 5\' 4"  (1.626 m)   Wt 77.1 kg   SpO2 97%   BMI 29.18 kg/m  Physical Exam Vitals and nursing note reviewed.  Constitutional:      General: She is not in acute distress.  Appearance: Normal appearance. She is well-developed. She is not ill-appearing.  HENT:     Head: Normocephalic and atraumatic.     Mouth/Throat:     Mouth: Mucous membranes are moist.  Eyes:     General: No scleral icterus.    Conjunctiva/sclera: Conjunctivae normal.  Cardiovascular:     Rate and Rhythm: Tachycardia present. Rhythm irregular.     Heart sounds: Murmur heard.     No friction rub.  Pulmonary:     Effort: Pulmonary effort is normal. No respiratory distress.     Breath sounds: Normal breath sounds.  Abdominal:     Palpations: Abdomen is soft.     Tenderness: There is no abdominal tenderness. There is no guarding.  Musculoskeletal:        General: No swelling.     Cervical back: Neck supple.  Skin:    General: Skin is warm and dry.     Capillary Refill: Capillary refill takes less than 2 seconds.  Neurological:     Mental Status: She is alert.  Psychiatric:        Mood and Affect: Mood normal.     ED Results and Treatments Labs (all labs ordered are listed, but only abnormal results are displayed) Labs Reviewed  CBC  COMPREHENSIVE METABOLIC PANEL  MAGNESIUM  TSH  T4, FREE  TROPONIN I (HIGH SENSITIVITY)  TROPONIN I (HIGH SENSITIVITY)                                                                                                                          Radiology DG Chest Port 1 View  Result Date: 07/05/2023 CLINICAL DATA:  Palpitations. EXAM: PORTABLE CHEST 1 VIEW COMPARISON:  09/04/2020 FINDINGS: The lungs are clear  without focal pneumonia, edema, pneumothorax or pleural effusion. Cardiopericardial silhouette is at upper limits of normal for size. No acute bony abnormality. Telemetry leads overlie the chest. IMPRESSION: No active disease. Electronically Signed   By: Kennith Center M.D.   On: 07/05/2023 07:27    Pertinent labs & imaging results that were available during my care of the patient were reviewed by me and considered in my medical decision making (see MDM for details).  Medications Ordered in ED Medications  sodium chloride 0.9 % bolus 1,000 mL (1,000 mLs Intravenous Bolus 07/05/23 0738)  metoprolol tartrate (LOPRESSOR) injection 5 mg (5 mg Intravenous Given 07/05/23 0739)  metoprolol tartrate (LOPRESSOR) injection 5 mg (5 mg Intravenous Given 07/05/23 0816)  Procedures Procedures  (including critical care time)  Medical Decision Making / ED Course    Medical Decision Making:    Shavana Chew is a 72 y.o. female with past medical history as below, significant for aflutter (on eliquis), GERD, HLD, IBS, s/p TKR (r) who presents to the ED with complaint of irregular heart beat. . The complaint involves an extensive differential diagnosis and also carries with it a high risk of complications and morbidity.  Serious etiology was considered. Ddx includes but is not limited to: electrolyte derangment, acs, afib, aflutter, svt, wpw, arrhythmia, etc  Complete initial physical exam performed, notably the patient  was tachycardia, irregular, bp stable.    Reviewed and confirmed nursing documentation for past medical history, family history, social history.  Vital signs reviewed.    Narrative: 72 yo female w/ hx afib here with palpitations/chest tightness that woke her from sleep Afib on telemetry Missed DOAC yestd but o/w good compliance w/ home medication  regimen  Clinical Course as of 07/05/23 1208  Sun Jul 05, 2023  1045 Rpt EKG with NSR [SG]    Clinical Course User Index [SG] Sloan Leiter, DO    Labs reviewed, these are stable, troponin is not elevated.  She has converted back to sinus rhythm after Lopressor She is feeling much better, no discomfort whatsoever, feeling back to normal. Rpt EKG with sinus bradycardia Advised f/u with cardiology, Dr Diona Browner  The patient improved significantly and was discharged in stable condition. Detailed discussions were had with the patient regarding current findings, and need for close f/u with PCP or on call doctor. The patient has been instructed to return immediately if the symptoms worsen in any way for re-evaluation. Patient verbalized understanding and is in agreement with current care plan. All questions answered prior to discharge.                  Additional history obtained: -Additional history obtained from spouse -External records from outside source obtained and reviewed including: Chart review including previous notes, labs, imaging, consultation notes including  Home meds Primary care documentation Prior ekgs Prior echo 12/23, LVEF 60-65%   Lab Tests: -I ordered, reviewed, and interpreted labs.   The pertinent results include:   Labs Reviewed  CBC  COMPREHENSIVE METABOLIC PANEL  MAGNESIUM  TSH  T4, FREE  TROPONIN I (HIGH SENSITIVITY)  TROPONIN I (HIGH SENSITIVITY)    Notable for stable labs  EKG   EKG Interpretation Date/Time:  Sunday July 05 2023 10:28:33 EDT Ventricular Rate:  54 PR Interval:  165 QRS Duration:  94 QT Interval:  433 QTC Calculation: 411 R Axis:   -31  Text Interpretation: Sinus rhythm Left axis deviation Low voltage, precordial leads Abnormal R-wave progression, late transition Borderline T abnormalities, anterior leads Sinus bradycardia Confirmed by Tanda Rockers ([id number]) on 07/05/2023 11:34:37 AM         Imaging Studies  ordered: I ordered imaging studies including CXR I independently visualized the following imaging with scope of interpretation limited to determining acute life threatening conditions related to emergency care; findings noted above, significant for no acute findings I independently visualized and interpreted imaging. I agree with the radiologist interpretation   Medicines ordered and prescription drug management: Meds ordered this encounter  Medications   sodium chloride 0.9 % bolus 1,000 mL   metoprolol tartrate (LOPRESSOR) injection 5 mg   metoprolol tartrate (LOPRESSOR) injection 5 mg    -I have reviewed the patients home medicines and have made adjustments  as needed   Consultations Obtained: na   Cardiac Monitoring: The patient was maintained on a cardiac monitor.  I personally viewed and interpreted the cardiac monitored which showed an underlying rhythm of: afib RVR > NSR/sinus brady  Social Determinants of Health:  Diagnosis or treatment significantly limited by social determinants of health: former smoker   Reevaluation: After the interventions noted above, I reevaluated the patient and found that they have resolved  Co morbidities that complicate the patient evaluation  Past Medical History:  Diagnosis Date   Arthritis    Atrial flutter (HCC)    Diagnosed October 2021   Breast cancer (HCC) 03/2017   Right   Bronchitis    Depression    GERD (gastroesophageal reflux disease)    Heart murmur    Hyperlipidemia    IBS (irritable bowel syndrome)    Impingement syndrome of left shoulder    Personal history of radiation therapy 2018      Dispostion: Disposition decision including need for hospitalization was considered, and patient discharged from emergency department.    Final Clinical Impression(s) / ED Diagnoses Final diagnoses:  Atrial fibrillation with RVR (HCC)        Sloan Leiter, DO 07/05/23 1208

## 2023-07-05 NOTE — Discharge Instructions (Addendum)
Please follow-up with your cardiologist  Please continue taking your home medications as prescribed, especially your diltiazem and Eliquis  It was a pleasure caring for you today in the emergency department.  Please return to the emergency department for any worsening or worrisome symptoms.

## 2023-07-05 NOTE — ED Triage Notes (Signed)
Pt with hx of Atrial Flutter who states her heart is "fluttering". Pt on Eliquis and states that she missed a dose yesterday.

## 2023-07-05 NOTE — ED Notes (Signed)
Pt states chest pressure has went away

## 2023-07-08 ENCOUNTER — Encounter: Payer: Self-pay | Admitting: Nurse Practitioner

## 2023-07-08 ENCOUNTER — Ambulatory Visit: Payer: Medicare Other | Attending: Nurse Practitioner | Admitting: Nurse Practitioner

## 2023-07-08 DIAGNOSIS — I4891 Unspecified atrial fibrillation: Secondary | ICD-10-CM | POA: Diagnosis not present

## 2023-07-08 DIAGNOSIS — E782 Mixed hyperlipidemia: Secondary | ICD-10-CM

## 2023-07-08 DIAGNOSIS — I471 Supraventricular tachycardia, unspecified: Secondary | ICD-10-CM

## 2023-07-08 DIAGNOSIS — I4892 Unspecified atrial flutter: Secondary | ICD-10-CM | POA: Diagnosis not present

## 2023-07-08 NOTE — Progress Notes (Unsigned)
Cardiology Office Note:  .   Date:  07/08/2023 ID:  Kathy Evans, DOB 02/20/1951, MRN [id number] PCP: Benita Stabile, MD  [facility] Providers Cardiologist:  Nona Dell, MD    History of Present Illness: .   Kathy Evans is a very pleasant 72 y.o. female with a PMH of atrial flutter/ PAF and intermittent PSVT, mixed HLD (statin intolerance), hx of breast cancer in 2018 (6 years cancer free), IBS, and arthritis, who presents today for hospital follow-up.   Last seen by Dr. Diona Browner on March 17, 2023. Was doing well at the time.   AP ED visit on July 05, 2023. Had felt chest tightness/pressure, felt "frozen" HR on apple watch was > 140. Was given IV Metoprolol, converted to SR. Repeat EKG showed SB. Was advised to f/u with cardiology.   Today she presents for OP follow-up. She is doing well. However she shows me her Fitbit readings. She is very active on the elliptical at the [facility], says her heart rate this AM went to 151 within 2 minutes, unusual for her. She denies any palpitations. Says only symptom she notices when HR is fast in A-fib is chest tightness. Denies any chest pain, shortness of breath, palpitations, syncope, presyncope, dizziness, orthopnea, PND, swelling or significant weight changes, acute bleeding, or claudication.   Studies Reviewed: .    Echo 10/2022:  1. Left ventricular ejection fraction, by estimation, is 60 to 65%. The  left ventricle has normal function. The left ventricle has no regional  wall motion abnormalities. Left ventricular diastolic parameters were  normal.   2. Right ventricular systolic function is normal. The right ventricular  size is normal.   3. Left atrial size was moderately dilated.   4. Right atrial size was mildly dilated.   5. The mitral valve is abnormal. Mild mitral valve regurgitation. No  evidence of mitral stenosis.   6. The aortic valve is tricuspid. There is mild calcification of the  aortic valve. There is mild  thickening of the aortic valve. Aortic valve  regurgitation is mild. Aortic valve sclerosis is present, with no evidence  of aortic valve stenosis.   7. The inferior vena cava is normal in size with greater than 50%  respiratory variability, suggesting right atrial pressure of 3 mmHg.  Cardiac monitor 08/2022: Predominant rhythm is sinus with heart rate ranging from 50 bpm up to 108 bpm and average heart rate 67 bpm. There were occasional PACs representing 3.9% total beats with otherwise rare atrial couplets and triplets. There were rare PVCs including ventricular couplets representing less than 1% total beats. Multiple episodes of SVT were noted, the longest of which lasted for approximately 19 seconds.  These episodes did not clearly correlate with patient triggered events. No sustained arrhythmias or pauses.  CCTA 07/2020: IMPRESSION: 1. Coronary calcium score of 0. Patient is low risk for near term coronary events   2. Normal coronary origin with left dominance.   3. No evidence of CAD.   4. CAD-RADS 0. Consider non-atherosclerotic causes of chest pain.  Risk Assessment/Calculations:    CHA2DS2-VASc Score = 2   This indicates a 2.2% annual risk of stroke. The patient's score is based upon: CHF History: 0 HTN History: 0 Diabetes History: 0 Stroke History: 0 Vascular Disease History: 0 Age Score: 1 Gender Score: 1  Physical Exam:   VS:  BP 116/81 (BP Location: Left Arm, Patient Position: Sitting, Cuff Size: Normal)   Pulse (!) 59   Ht 5' 3.5" (  1.613 m)   Wt 176 lb (79.8 kg)   SpO2 98%   BMI 30.69 kg/m    Wt Readings from Last 3 Encounters:  07/08/23 176 lb (79.8 kg)  07/05/23 170 lb (77.1 kg)  03/17/23 175 lb (79.4 kg)    GEN: Well nourished, well developed in no acute distress NECK: No JVD; No carotid bruits CARDIAC: S1/S2, RRR, no murmurs, rubs, gallops RESPIRATORY:  Clear to auscultation without rales, wheezing or rhonchi  ABDOMEN: Soft, non-tender,  non-distended EXTREMITIES:  No edema; No deformity   ASSESSMENT AND PLAN: .    PAF, A-flutter and intermittent PSVT Recent episode of A-fib, converted to SR after IV Metoprolol. All recent labs WNL. HR elevated during AM workout within 2 minutes on elliptical. Recommended/offered Zio monitor, however pt declines. Recommended Kardia mobile. Pt states she will continue to monitor her symptoms on her Fitbit at this time. HR well controlled currently during office visit with baseline HR around 59-61 bpm. Hesitant to up-titrate diltiazem d/t current HR. Continue diltiazem and Eliquis, on appropriate dosage. Denies any bleeding issues. If she has more recurrent episodes, may want to consider referral to A-fib clinic/EP referral.   Mixed HLD Has hx of statin intolerance. Has upcoming labs soon per her report. Continue Repatha. Continue to follow-up with clinical pharm D as scheduled.   Dispo: Follow-up with Dr. Diona Browner as scheduled or sooner if needed.  Signed, Sharlene Dory, NP

## 2023-07-08 NOTE — Patient Instructions (Addendum)
Medication Instructions:  Your physician recommends that you continue on your current medications as directed. Please refer to the Current Medication list given to you today.  Labwork: None  Testing/Procedures: None  Follow-Up: Your physician recommends that you schedule a follow-up appointment in: Follow up with Dr.McDowell as scheduled   Any Other Special Instructions Will Be Listed Below (If Applicable).  If you need a refill on your cardiac medications before your next appointment, please call your pharmacy.

## 2023-07-10 ENCOUNTER — Other Ambulatory Visit (HOSPITAL_COMMUNITY): Payer: Self-pay

## 2023-07-10 ENCOUNTER — Inpatient Hospital Stay: Payer: Medicare Other

## 2023-07-10 DIAGNOSIS — C50911 Malignant neoplasm of unspecified site of right female breast: Secondary | ICD-10-CM

## 2023-07-13 ENCOUNTER — Ambulatory Visit (HOSPITAL_COMMUNITY)
Admission: RE | Admit: 2023-07-13 | Discharge: 2023-07-13 | Disposition: A | Discharge status: Home / Self Care | Payer: Medicare Other | Source: Ambulatory Visit | Attending: Hematology | Admitting: Hematology

## 2023-07-13 ENCOUNTER — Encounter (HOSPITAL_COMMUNITY): Payer: Self-pay | Admitting: Radiology

## 2023-07-13 DIAGNOSIS — C50911 Malignant neoplasm of unspecified site of right female breast: Secondary | ICD-10-CM | POA: Insufficient documentation

## 2023-07-13 DIAGNOSIS — Z170 Estrogen receptor positive status [ER+]: Secondary | ICD-10-CM | POA: Insufficient documentation

## 2023-07-13 DIAGNOSIS — K5730 Diverticulosis of large intestine without perforation or abscess without bleeding: Secondary | ICD-10-CM | POA: Diagnosis not present

## 2023-07-13 MED ORDER — IOHEXOL 300 MG/ML  SOLN
100.0000 mL | Freq: Once | INTRAMUSCULAR | Status: AC | PRN
  Administered 2023-07-13: 100 mL via INTRAVENOUS

## 2023-07-16 ENCOUNTER — Ambulatory Visit: Payer: Medicare Other | Admitting: Hematology

## 2023-07-21 ENCOUNTER — Encounter: Payer: Self-pay | Admitting: Hematology

## 2023-07-21 ENCOUNTER — Inpatient Hospital Stay: Payer: Medicare Other | Attending: Hematology | Admitting: Hematology

## 2023-07-21 VITALS — BP 146/65 | HR 62 | Temp 98.2°F | Resp 18 | Wt 176.6 lb

## 2023-07-21 DIAGNOSIS — M8580 Other specified disorders of bone density and structure, unspecified site: Secondary | ICD-10-CM | POA: Insufficient documentation

## 2023-07-21 DIAGNOSIS — Z170 Estrogen receptor positive status [ER+]: Secondary | ICD-10-CM

## 2023-07-21 DIAGNOSIS — Z923 Personal history of irradiation: Secondary | ICD-10-CM | POA: Insufficient documentation

## 2023-07-21 DIAGNOSIS — C50911 Malignant neoplasm of unspecified site of right female breast: Secondary | ICD-10-CM | POA: Insufficient documentation

## 2023-07-21 NOTE — Progress Notes (Signed)
[facility] [address], [place] [address]    Clinic Day:  07/21/2023  Referring physician: Benita Stabile, MD  Patient Care Team: Benita Stabile, MD as PCP - General (Internal Medicine) Jonelle Sidle, MD as PCP - Cardiology (Cardiology) [place] Bali, MD (Inactive) as Consulting Physician (Gastroenterology)   ASSESSMENT & PLAN:   Assessment: 1.  Stage Ia right breast IDC: -Status post lumpectomy and SLNB on 04/30/2017 in [name].  Completed adjuvant radiation on 07/08/2017. - Anastrozole started around 06/03/2017, discontinued secondary to side effects after taking 6 months.  Femara was also discontinued secondary to dry vaginitis within a few weeks. -Right breast biopsy on 10/12/2018 shows scant benign adipose tissue with fibrosis with no malignancy. -Mammogram on 10/18/2019 was BI-RADS Category 2.   2.  Osteopenia: -Bone density on 08/21/2017 shows T score -1.1. - DEXA scan (09/10/2021): T-score -1.5      Plan: 1.  Stage Ia right breast IDC: - She could only tolerate anastrozole for 4 months and letrozole for few weeks. - Mammogram on 01/05/2023 was BI-RADS Category 1. - Recommend repeating mammogram on 01/06/2024.    2.  Osteopenia: - She has stopped taking vitamin D about 3 months ago. - Will check vitamin D level at next visit in 1 year.  Will also recommend repeating bone density test.  3.  Left anterior bladder wall mass: - CTAP (12/26/2022): At [facility] reviewed by me shows 1.1 cm hyperattenuating lesion imperceptible from the left anterior bladder wall, which may represent an exophytic mass versus remnant urachal cyst versus calcified lymph node. - She had a cystoscopy with Dr. Patsi Sears which was normal. - She denies any hematuria since last visit. - Reviewed CT pelvis from 07/11/2023: Stable small calcified nodular lesion anterior to the left aspect of the bladder.  Sigmoid diverticulosis. - She has a follow-up with her  urologist.  I would not recommend any further imaging at this time.   Breast Cancer therapy associated bone loss: I have recommended calcium, Vitamin D and weight bearing exercises.  Orders Placed This Encounter  Procedures   DG Bone Density    Standing Status:   Future    Standing Expiration Date:   07/20/2024    Order Specific Question:   Reason for Exam (SYMPTOM  OR DIAGNOSIS REQUIRED)    Answer:   personal hx of breast cancer    Order Specific Question:   Preferred imaging location?    Answer:   [facility]    Order Specific Question:   Release to patient    Answer:   Immediate   MM 3D SCREENING MAMMOGRAM BILATERAL BREAST    No implants NAS No devices    Standing Status:   Future    Standing Expiration Date:   07/20/2024    Order Specific Question:   Reason for Exam (SYMPTOM  OR DIAGNOSIS REQUIRED)    Answer:   screening for breast cancer    Order Specific Question:   Preferred imaging location?    Answer:   [facility]   CBC with Differential/Platelet    Standing Status:   Future    Standing Expiration Date:   07/20/2024    Order Specific Question:   Release to patient    Answer:   Immediate   Comprehensive metabolic panel    Standing Status:   Future    Standing Expiration Date:   07/20/2024    Order Specific Question:  Release to patient    Answer:   Immediate   VITAMIN D 25 Hydroxy (Vit-D Deficiency, Fractures)    Standing Status:   Future    Standing Expiration Date:   07/20/2024    Order Specific Question:   Release to patient    Answer:   Immediate      I,Helena R Teague,acting as a scribe for Doreatha Massed, MD.,have documented all relevant documentation on the behalf of Doreatha Massed, MD,as directed by  Doreatha Massed, MD while in the presence of Doreatha Massed, MD.  I, Doreatha Massed MD, have reviewed the above documentation for accuracy and completeness, and I agree with the above.    Doreatha Massed, MD    9/3/20244:56 PM  CHIEF COMPLAINT:   Diagnosis: right breast cancer     Cancer Staging  No matching staging information was found for the patient.    Prior Therapy: None  Current Therapy:  None   HISTORY OF PRESENT ILLNESS:   Oncology History   No history exists.     INTERVAL HISTORY:   Kathy Evans is a 72 y.o. female presenting to clinic today for follow up of right breast cancer. She was last seen by me on 01/08/23.  Since her last visit, she underwent CT pelvis on 07/13/23 that found: stable small calcified nodular lesion anterior to the LEFT aspect bladder and sigmoid diverticulosis.   She presented to the ED on 07/05/23 for A-fib with RVR. She was given Lopressor and converted back to sinus rhythm.   Today, she states that she is doing well overall. Her appetite level is at 100%. Her energy level is at 100%. She is accompanied by her husband.  She denies any hematuria. She has not seen her urologist, as her CT results were still pending. She is no longer taking Vitamin D and stopped taking it 3 months ago.   PAST MEDICAL HISTORY:   Past Medical History: Past Medical History:  Diagnosis Date   Arthritis    Atrial flutter [facility])    Diagnosed October 2021   Breast cancer (HCC) 03/2017   Right   Bronchitis    Depression    GERD (gastroesophageal reflux disease)    Heart murmur    Hyperlipidemia    IBS (irritable bowel syndrome)    Impingement syndrome of left shoulder    Personal history of radiation therapy 2018    Surgical History: Past Surgical History:  Procedure Laterality Date   ADENOIDECTOMY     APPENDECTOMY     BREAST BIOPSY Left    benign   BREAST LUMPECTOMY Right 2018   BUNIONECTOMY Right    CARDIAC CATHETERIZATION     CATARACT EXTRACTION, BILATERAL     CHOLECYSTECTOMY     colon adhesion     COLONOSCOPY WITH PROPOFOL N/A 02/02/2018   Dr. Darrick Penna: multiple small and large-mouthed diverticula in recto-sigmoid colon, sigmoid, and descending. TI  normal. Internal hemorrhoids during retroflexion, small. Moderate external hemorrhoids   Full mastectomy Right    MASTECTOMY, PARTIAL Right    PROLAPSED UTERINE FIBROID LIGATION     REPLACEMENT TOTAL KNEE Right 05/30/2022   TOE SURGERY Left    TONSILLECTOMY     TOTAL KNEE ARTHROPLASTY Right 05/30/2022   Procedure: TOTAL KNEE ARTHROPLASTY;  Surgeon: Frederico Hamman, MD;  Location: [facility];  Service: Orthopedics;  Laterality: Right;   TUBAL LIGATION     VAGINAL HYSTERECTOMY     VESICOVAGINAL FISTULA CLOSURE      Social History: Social History  Socioeconomic History   Marital status: Married    Spouse name: Not on file   Number of children: Not on file   Years of education: Not on file   Highest education level: Not on file  Occupational History   Occupation: retired  Tobacco Use   Smoking status: Former    Current packs/day: 0.00    Average packs/day: 1.5 packs/day for 35.0 years (52.5 ttl pk-yrs)    Types: Cigarettes    Start date: 93    Quit date: 2000    Years since quitting: 24.6   Smokeless tobacco: Never  Vaping Use   Vaping status: Never Used  Substance and Sexual Activity   Alcohol use: Yes    Comment: very seldom   Drug use: No   Sexual activity: Not on file    Comment: hyst  Other Topics Concern   Not on file  Social History Narrative   Not on file   Social Determinants of Health   Financial Resource Strain: Low Risk  (12/11/2020)   Overall Financial Resource Strain (CARDIA)    Difficulty of Paying Living Expenses: Not hard at all  Food Insecurity: No Food Insecurity (12/11/2020)   Hunger Vital Sign    Worried About Running Out of Food in the Last Year: Never true    Ran Out of Food in the Last Year: Never true  Transportation Needs: No Transportation Needs (12/11/2020)   PRAPARE - Administrator, Civil Service (Medical): No    Lack of Transportation (Non-Medical): No  Physical Activity: Insufficiently Active (12/11/2020)   Exercise Vital  Sign    Days of Exercise per Week: 2 days    Minutes of Exercise per Session: 30 min  Stress: No Stress Concern Present (12/11/2020)   [employer] of Occupational Health - Occupational Stress Questionnaire    Feeling of Stress : Only a little  Social Connections: Socially Integrated (12/11/2020)   Social Connection and Isolation Panel [NHANES]    Frequency of Communication with Friends and Family: Three times a week    Frequency of Social Gatherings with Friends and Family: Three times a week    Attends Religious Services: More than 4 times per year    Active Member of Clubs or Organizations: Yes    Attends Banker Meetings: More than 4 times per year    Marital Status: Married  Catering manager Violence: Not At Risk (12/11/2020)   Humiliation, Afraid, Rape, and Kick questionnaire    Fear of Current or Ex-Partner: No    Emotionally Abused: No    Physically Abused: No    Sexually Abused: No    Family History: Family History  Problem Relation Age of Onset   Diabetes Maternal Grandmother    Alzheimer's disease Maternal Grandmother    Heart disease Father    Cancer Mother        uterine   Diabetes Mother    Heart disease Brother    Diabetes Brother    Cancer Sister        lung   Diabetes Sister    Heart disease Sister        had stent placed   Heart disease Brother    Cancer Sister        uterine   Diabetes Sister    High blood pressure Sister    Colon cancer Neg Hx     Current Medications:  Current Outpatient Medications:    ALPRAZolam (XANAX) 0.5 MG tablet, Take 0.25-0.5  mg by mouth daily as needed for anxiety or sleep., Disp: , Rfl:    apixaban (ELIQUIS) 5 MG TABS tablet, Take 1 tablet (5 mg total) by mouth 2 (two) times daily., Disp: 200 tablet, Rfl: 1   buPROPion (WELLBUTRIN XL) 300 MG 24 hr tablet, Take 300 mg by mouth daily., Disp: , Rfl:    diltiazem (CARDIZEM CD) 120 MG 24 hr capsule, TAKE 1 CAPSULE BY MOUTH DAILY (Patient taking differently:  Take 120 mg by mouth every evening.), Disp: 100 capsule, Rfl: 2   Evolocumab (REPATHA SURECLICK) 140 MG/ML SOAJ, Inject 140 mg into the skin every 14 (fourteen) days., Disp: 2 mL, Rfl: 12   furosemide (LASIX) 20 MG tablet, Take 20 mg by mouth daily as needed for fluid., Disp: , Rfl:    levocetirizine (XYZAL) 5 MG tablet, Take 5 mg by mouth every evening., Disp: , Rfl:    MAGNESIUM PO, Take 1 tablet by mouth daily as needed (cramps)., Disp: , Rfl:    olopatadine (PATADAY) 0.1 % ophthalmic solution, Place 1 drop into both eyes daily as needed for allergies., Disp: , Rfl:    Allergies: Allergies  Allergen Reactions   Statins Other (See Comments)    Myalgias Arthralgias    Tape Itching    Redness     REVIEW OF SYSTEMS:   Review of Systems  Constitutional:  Negative for chills, fatigue and fever.  HENT:   Negative for lump/mass, mouth sores, nosebleeds, sore throat and trouble swallowing.   Eyes:  Negative for eye problems.  Respiratory:  Negative for cough and shortness of breath.   Cardiovascular:  Positive for leg swelling (occasional). Negative for chest pain and palpitations.  Gastrointestinal:  Negative for abdominal pain, constipation, diarrhea, nausea and vomiting.  Genitourinary:  Negative for bladder incontinence, difficulty urinating, dysuria, frequency, hematuria and nocturia.   Musculoskeletal:  Negative for arthralgias, back pain, flank pain, myalgias and neck pain.  Skin:  Negative for itching and rash.  Neurological:  Negative for dizziness, headaches and numbness.  Hematological:  Does not bruise/bleed easily.  Psychiatric/Behavioral:  Negative for depression, sleep disturbance and suicidal ideas. The patient is not nervous/anxious.   All other systems reviewed and are negative.    VITALS:   Blood pressure (!) 146/65, pulse 62, temperature 98.2 F (36.8 C), temperature source Oral, resp. rate 18, weight 176 lb 9.6 oz (80.1 kg), SpO2 98%.  Wt Readings from Last 3  Encounters:  07/21/23 176 lb 9.6 oz (80.1 kg)  07/08/23 176 lb (79.8 kg)  07/05/23 170 lb (77.1 kg)    Body mass index is 30.79 kg/m.  Performance status (ECOG): 1 - Symptomatic but completely ambulatory  PHYSICAL EXAM:   Physical Exam Vitals and nursing note reviewed. Exam conducted with a chaperone present.  Constitutional:      Appearance: Normal appearance.  Cardiovascular:     Rate and Rhythm: Normal rate and regular rhythm.     Pulses: Normal pulses.     Heart sounds: Normal heart sounds.  Pulmonary:     Effort: Pulmonary effort is normal.     Breath sounds: Normal breath sounds.  Abdominal:     Palpations: Abdomen is soft. There is no hepatomegaly, splenomegaly or mass.     Tenderness: There is no abdominal tenderness.  Musculoskeletal:     Right lower leg: No edema.     Left lower leg: No edema.  Lymphadenopathy:     Cervical: No cervical adenopathy.     Right cervical: No  superficial, deep or posterior cervical adenopathy.    Left cervical: No superficial, deep or posterior cervical adenopathy.     Upper Body:     Right upper body: No supraclavicular or axillary adenopathy.     Left upper body: No supraclavicular or axillary adenopathy.  Neurological:     General: No focal deficit present.     Mental Status: She is alert and oriented to person, place, and time.  Psychiatric:        Mood and Affect: Mood normal.        Behavior: Behavior normal.     LABS:      Latest Ref Rng & Units 07/05/2023    6:59 AM 01/05/2023    1:16 PM 08/14/2022    7:24 PM  CBC  WBC 4.0 - 10.5 K/uL 4.7  5.7  14.8   Hemoglobin 12.0 - 15.0 g/dL 79.3  90.3  00.9   Hematocrit 36.0 - 46.0 % 43.0  40.4  44.9   Platelets 150 - 400 K/uL 219  224  248       Latest Ref Rng & Units 07/05/2023    6:59 AM 01/05/2023    1:16 PM 08/14/2022    9:01 PM  CMP  Glucose 70 - 99 mg/dL 91  93  233   BUN 8 - 23 mg/dL 14  19  17    Creatinine 0.44 - 1.00 mg/dL 0.07  6.22  6.33   Sodium 135 - 145  mmol/L 138  139  141   Potassium 3.5 - 5.1 mmol/L 3.7  4.4  4.2   Chloride 98 - 111 mmol/L 106  104  106   CO2 22 - 32 mmol/L 25  26  25    Calcium 8.9 - 10.3 mg/dL 9.1  8.9  9.4   Total Protein 6.5 - 8.1 g/dL 6.8  7.0  6.9   Total Bilirubin 0.3 - 1.2 mg/dL 0.4  0.2  0.7   Alkaline Phos 38 - 126 U/L 75  72  79   AST 15 - 41 U/L 17  18  14    ALT 0 - 44 U/L 18  14  13       No results found for: "CEA1", "CEA" / No results found for: "CEA1", "CEA" No results found for: "PSA1" No results found for: "HLK562" No results found for: "CAN125"  No results found for: "TOTALPROTELP", "ALBUMINELP", "A1GS", "A2GS", "BETS", "BETA2SER", "GAMS", "MSPIKE", "SPEI" No results found for: "TIBC", "FERRITIN", "IRONPCTSAT" No results found for: "LDH"   STUDIES:   CT Pelvis W Contrast  Result Date: 07/21/2023 CLINICAL DATA:  Follow-up of bladder lesion. EXAM: CT PELVIS WITH CONTRAST TECHNIQUE: Multidetector CT imaging of the pelvis was performed using the standard protocol following the bolus administration of intravenous contrast. RADIATION DOSE REDUCTION: This exam was performed according to the departmental dose-optimization program which includes automated exposure control, adjustment of the mA and/or kV according to patient size and/or use of iterative reconstruction technique. CONTRAST:  OMNIPAQUE IOHEXOL 300 MG/ML  SOLN COMPARISON:  CT 08/14/2022, 12/26/2022 FINDINGS: Urinary Tract: Distal ureters and bladder normal. Small rounded lesion anterior to the LEFT aspect of the bladder measures 8 mm x 8 mm not changed from CT 08/14/2022. On comparison CT from 08/06/2022 lesion was hyper dense on noncontrast imaging suggest calcification. Lesion remains hyperdense. Delayed imaging demonstrates partial filling of the bladder with contrast. Bowel:  Diverticulosis of sigmoid colon. Vascular/Lymphatic: No lymphadenopathy. Intimal calcification of the iliac arteries Reproductive:  Post hysterectomy Other:  None  Musculoskeletal: No suspicious bone lesions identified. IMPRESSION: 1. Stable small calcified nodular lesion anterior to the LEFT aspect bladder. 2. Sigmoid diverticulosis Electronically Signed   By: Genevive Bi M.D.   On: 07/21/2023 09:15   DG Chest Port 1 View  Result Date: 07/05/2023 CLINICAL DATA:  Palpitations. EXAM: PORTABLE CHEST 1 VIEW COMPARISON:  09/04/2020 FINDINGS: The lungs are clear without focal pneumonia, edema, pneumothorax or pleural effusion. Cardiopericardial silhouette is at upper limits of normal for size. No acute bony abnormality. Telemetry leads overlie the chest. IMPRESSION: No active disease. Electronically Signed   By: Kennith Center M.D.   On: 07/05/2023 07:27

## 2023-07-21 NOTE — Patient Instructions (Signed)
[facility]  Discharge Instructions  You were seen and examined today by Dr. Ellin Saba.  Dr. Ellin Saba discussed your most recent lab work which revealed that everything looks good.  Dr. Ellin Saba will repeat mammogram and labs before your next appointment.  Follow-up as scheduled in 1 year.    Thank you for choosing [facility] to provide your oncology and hematology care.   To afford each patient quality time with our provider, please arrive at least 15 minutes before your scheduled appointment time. You may need to reschedule your appointment if you arrive late (10 or more minutes). Arriving late affects you and other patients whose appointments are after yours.  Also, if you miss three or more appointments without notifying the office, you may be dismissed from the clinic at the provider's discretion.    Again, thank you for choosing [facility].  Our hope is that these requests will decrease the amount of time that you wait before being seen by our physicians.   If you have a lab appointment with the Cancer Center - please note that after April 8th, all labs will be drawn in the cancer center.  You do not have to check in or register with the main entrance as you have in the past but will complete your check-in at the cancer center.            _____________________________________________________________  Should you have questions after your visit to [facility], please contact our office at [phone] and follow the prompts.  Our office hours are 8:00 a.m. to 4:30 p.m. Monday - Thursday and 8:00 a.m. to 2:30 p.m. Friday.  Please note that voicemails left after 4:00 p.m. may not be returned until the following business day.  We are closed weekends and all major holidays.  You do have access to a nurse 24-7, just call the main number to the clinic [phone] and do not press any options, hold on the  line and a nurse will answer the phone.    For prescription refill requests, have your pharmacy contact our office and allow 72 hours.    Masks are no longer required in the cancer centers. If you would like for your care team to wear a mask while they are taking care of you, please let them know. You may have one support person who is at least 74 years old accompany you for your appointments.

## 2023-07-29 DIAGNOSIS — N3289 Other specified disorders of bladder: Secondary | ICD-10-CM | POA: Diagnosis not present

## 2023-08-25 DIAGNOSIS — E559 Vitamin D deficiency, unspecified: Secondary | ICD-10-CM | POA: Diagnosis not present

## 2023-08-25 DIAGNOSIS — E782 Mixed hyperlipidemia: Secondary | ICD-10-CM | POA: Diagnosis not present

## 2023-08-31 ENCOUNTER — Encounter: Payer: Self-pay | Admitting: Internal Medicine

## 2023-08-31 DIAGNOSIS — E782 Mixed hyperlipidemia: Secondary | ICD-10-CM | POA: Diagnosis not present

## 2023-08-31 DIAGNOSIS — Z853 Personal history of malignant neoplasm of breast: Secondary | ICD-10-CM | POA: Diagnosis not present

## 2023-08-31 DIAGNOSIS — N3289 Other specified disorders of bladder: Secondary | ICD-10-CM | POA: Diagnosis not present

## 2023-08-31 DIAGNOSIS — K581 Irritable bowel syndrome with constipation: Secondary | ICD-10-CM | POA: Diagnosis not present

## 2023-08-31 DIAGNOSIS — I480 Paroxysmal atrial fibrillation: Secondary | ICD-10-CM | POA: Diagnosis not present

## 2023-08-31 DIAGNOSIS — M8580 Other specified disorders of bone density and structure, unspecified site: Secondary | ICD-10-CM | POA: Diagnosis not present

## 2023-08-31 DIAGNOSIS — M179 Osteoarthritis of knee, unspecified: Secondary | ICD-10-CM | POA: Diagnosis not present

## 2023-08-31 DIAGNOSIS — Z23 Encounter for immunization: Secondary | ICD-10-CM | POA: Diagnosis not present

## 2023-08-31 DIAGNOSIS — Z0000 Encounter for general adult medical examination without abnormal findings: Secondary | ICD-10-CM | POA: Diagnosis not present

## 2023-08-31 DIAGNOSIS — K219 Gastro-esophageal reflux disease without esophagitis: Secondary | ICD-10-CM | POA: Diagnosis not present

## 2023-09-03 ENCOUNTER — Ambulatory Visit: Payer: Medicare Other | Admitting: Cardiology

## 2023-09-14 ENCOUNTER — Ambulatory Visit: Payer: Medicare Other | Attending: Cardiology | Admitting: Cardiology

## 2023-09-14 ENCOUNTER — Encounter: Payer: Self-pay | Admitting: Cardiology

## 2023-09-14 VITALS — BP 136/88 | HR 60 | Ht 63.5 in | Wt 178.6 lb

## 2023-09-14 DIAGNOSIS — I4892 Unspecified atrial flutter: Secondary | ICD-10-CM | POA: Diagnosis not present

## 2023-09-14 DIAGNOSIS — E782 Mixed hyperlipidemia: Secondary | ICD-10-CM | POA: Diagnosis not present

## 2023-09-14 DIAGNOSIS — Z789 Other specified health status: Secondary | ICD-10-CM

## 2023-09-14 NOTE — Patient Instructions (Addendum)

## 2023-09-14 NOTE — Progress Notes (Signed)
Cardiology Office Note  Date: 09/14/2023   ID: Kathy Evans, DOB 06/21/1951, MRN [id number]  History of Present Illness: Kathy Evans is a 72 y.o. female last seen in August by Ms. Philis Nettle NP, I reviewed the note.  She is here today with her husband for a follow-up visit.  She reports intermittent relatively brief palpitations, sometimes associated with dizziness.  She has not had any more prolonged events since her ER visit back in August.  At that time her ECG was most consistent with atypical atrial flutter with 2:1 block.  I reviewed her medications.  Current cardiac regimen includes Eliquis, Cardizem CD, Lasix and Repatha.  She does not report any spontaneous bleeding problems.  Her most recent lab work shows continued improvement in LDL on Repatha down to 101.  Physical Exam: VS:  BP 136/88 (BP Location: Left Arm)   Pulse 60   Ht 5' 3.5" (1.613 m)   Wt 178 lb 9.6 oz (81 kg)   SpO2 99%   BMI 31.14 kg/m , BMI Body mass index is 31.14 kg/m.  Wt Readings from Last 3 Encounters:  09/14/23 178 lb 9.6 oz (81 kg)  07/21/23 176 lb 9.6 oz (80.1 kg)  07/08/23 176 lb (79.8 kg)    General: Patient appears comfortable at rest. HEENT: Conjunctiva and lids normal. Neck: Supple, no elevated JVP or carotid bruits. Lungs: Clear to auscultation, nonlabored breathing at rest. Cardiac: Regular rate and rhythm, no S3 or significant systolic murmur. Extremities: No pitting edema.  ECG:  An ECG dated 07/05/2023 was personally reviewed today and demonstrated:  Sinus rhythm with leftward axis and nonspecific ST changes.  Labwork: 07/05/2023: ALT 18; AST 17; BUN 14; Creatinine, Ser 0.82; Hemoglobin 14.1; Magnesium 2.1; Platelets 219; Potassium 3.7; Sodium 138; TSH 2.267  October 2024: Hemoglobin 13.6, platelets 215, BUN 10, creatinine 0.88, potassium 4.6, AST 15, ALT 13, cholesterol 171, triglycerides 102, HDL 51, LDL 101, hemoglobin A1c 5.3%  Other Studies Reviewed Today:  No interval cardiac  testing for review today.  Assessment and Plan:  1.  History of paroxysmal atrial flutter and intermittent PSVT with CHA2DS2-VASc score of 2.  She reports recurring symptoms, although relatively brief.  Nothing prolonged since her ER encounter in August.  Plan at this time is to continue current dose of Cardizem CD along with Eliquis.  If symptoms escalate, EP consultation would be our next step.   2.  Severe mixed hyperlipidemia with multi -statin intolerance as well as intolerance to Zetia.  She has had evaluation in the lipid clinic and has started on Repatha.  LDL continues to improve, recently down to 101.  Disposition:  Follow up  6 months.  Signed, Jonelle Sidle, M.D., F.A.C.C. [facility]

## 2023-09-29 DIAGNOSIS — M25552 Pain in left hip: Secondary | ICD-10-CM | POA: Diagnosis not present

## 2023-10-05 ENCOUNTER — Other Ambulatory Visit: Payer: Self-pay | Admitting: Cardiology

## 2023-10-05 NOTE — Telephone Encounter (Signed)
Prescription refill request for Eliquis received. Indication: PAF Last office visit: 09/14/23  Ival Bible MD Scr: 0.88 on 08/25/23  Epic Age: 72 Weight: 81kg  Based on above findings Eliquis 5mg  twice daily is the appropriate dose.  Refill approved.

## 2023-10-20 DIAGNOSIS — K5790 Diverticulosis of intestine, part unspecified, without perforation or abscess without bleeding: Secondary | ICD-10-CM | POA: Diagnosis not present

## 2023-10-29 ENCOUNTER — Encounter: Payer: Self-pay | Admitting: Cardiology

## 2023-10-30 ENCOUNTER — Other Ambulatory Visit: Payer: Self-pay | Admitting: Cardiology

## 2023-10-30 MED ORDER — APIXABAN 5 MG PO TABS: 5.0000 mg | ORAL_TABLET | Freq: Two times a day (BID) | ORAL | 0 refills | Status: DC

## 2023-10-30 NOTE — Telephone Encounter (Signed)
Sample request for Eliquis received. Indication: Afib  Last office visit: 11/14/23 Diona Browner)  Scr: 0.88 (08/25/23)  Age: 72 Weight: 81kg  Eliquis 5mg  BID is appropriate dose.

## 2023-11-02 ENCOUNTER — Telehealth: Payer: Self-pay | Admitting: Cardiology

## 2023-11-02 MED ORDER — FUROSEMIDE 20 MG PO TABS: 20.0000 mg | ORAL_TABLET | Freq: Every day | ORAL | 1 refills | Status: DC | PRN

## 2023-11-02 MED ORDER — APIXABAN 5 MG PO TABS: 5.0000 mg | ORAL_TABLET | Freq: Two times a day (BID) | ORAL | 1 refills | Status: DC

## 2023-11-02 MED ORDER — DILTIAZEM HCL ER COATED BEADS 120 MG PO CP24: 120.0000 mg | ORAL_CAPSULE | Freq: Every day | ORAL | 2 refills | Status: DC

## 2023-11-02 NOTE — Telephone Encounter (Signed)
All cardiac medications sent in to starting being filled at [facility] 11/18/23

## 2023-11-02 NOTE — Telephone Encounter (Signed)
Pt is Switching Pharmacy to [facility].

## 2023-11-18 ENCOUNTER — Emergency Department (HOSPITAL_COMMUNITY)
Admission: EM | Admit: 2023-11-18 | Discharge: 2023-11-18 | Disposition: A | Discharge status: Home / Self Care | Payer: HMO | Attending: Emergency Medicine | Admitting: Emergency Medicine

## 2023-11-18 ENCOUNTER — Encounter (HOSPITAL_COMMUNITY): Payer: Self-pay | Admitting: Emergency Medicine

## 2023-11-18 ENCOUNTER — Other Ambulatory Visit: Payer: Self-pay

## 2023-11-18 DIAGNOSIS — H81399 Other peripheral vertigo, unspecified ear: Secondary | ICD-10-CM | POA: Diagnosis not present

## 2023-11-18 DIAGNOSIS — I4891 Unspecified atrial fibrillation: Secondary | ICD-10-CM | POA: Insufficient documentation

## 2023-11-18 DIAGNOSIS — R110 Nausea: Secondary | ICD-10-CM | POA: Diagnosis present

## 2023-11-18 DIAGNOSIS — R9431 Abnormal electrocardiogram [ECG] [EKG]: Secondary | ICD-10-CM | POA: Diagnosis not present

## 2023-11-18 DIAGNOSIS — Z7901 Long term (current) use of anticoagulants: Secondary | ICD-10-CM | POA: Diagnosis not present

## 2023-11-18 MED ORDER — MECLIZINE HCL 12.5 MG PO TABS
25.0000 mg | ORAL_TABLET | Freq: Once | ORAL | Status: AC
  Administered 2023-11-18: 25 mg via ORAL
  Filled 2023-11-18: qty 2

## 2023-11-18 MED ORDER — ONDANSETRON HCL 4 MG PO TABS: 4.0000 mg | ORAL_TABLET | Freq: Four times a day (QID) | ORAL | 0 refills | Status: DC

## 2023-11-18 MED ORDER — MECLIZINE HCL 25 MG PO TABS: 25.0000 mg | ORAL_TABLET | Freq: Three times a day (TID) | ORAL | 0 refills | Status: DC | PRN

## 2023-11-18 NOTE — Discharge Instructions (Signed)
 Your symptoms are consistent with what is called benign positional vertigo.  This is usually related to the inner ear and should get better within days, there are times where he goes longer than that.  If you develop severe or worsening symptoms including persistent vertigo, changes in speech vision or your ability to perform simple tasks using your arms or legs I want you to return to the emergency department immediately.  You may take the following medications that may help however your ultimate treatment should be to stay very still for the next couple of days, rest in bed, move very slowly if he have to change position  Meclizine  is a medication that can help with vertigo and dizziness.  You may take 1 tablet every 6 hours as needed.  Please make sure that you do not drive when you are taking this medication as it can make you sleepy.  Zofran  is a medication which can help with nausea.  You may take 4 mg by mouth every 6 hours as needed if you are an adult, if your child under the age of 6 take half of a tablet or 2 mg every 6 hours as needed.  This should dissolve on your tongue within a short timeframe.  Wait about 30 minutes after taking it to help with drinking clear liquids.  Thank you for allowing us  to treat you in the emergency department today.  After reviewing your examination and potential testing that was done it appears that you are safe to go home.  I would like for you to follow-up with your doctor within the next several days, have them obtain your records and follow-up with them to review all potential tests and results from your visit.  If you should develop severe or worsening symptoms return to the emergency department immediately

## 2023-11-18 NOTE — ED Provider Notes (Signed)
 [place] EMERGENCY DEPARTMENT AT [facility] Provider Note   CSN: [id number] Arrival date & time: 11/18/23  1026     History  Chief Complaint  Patient presents with   Atrial Fibrillation   Dizziness    Kathy Evans is a 73 y.o. female.   Atrial Fibrillation  Dizziness  This patient is a 73 year old female, she has a history of atrial fibrillation on Eliquis  and diltiazem .  She also has a history recently having some intermittent vertigo, she states that sometimes she gets a feeling of the room spinning, this morning when she rolled over in bed she had acute onset of a feeling of room spinning and nausea, there was no diaphoresis no vomiting no chest pain no shortness of breath and no numbness or weakness in the arms or the legs, no difficulty with speech, no changes in vision.  Her symptoms completely go away when she holds perfectly still.  No recent head injuries, no ringing of the ear, no headache    Home Medications Prior to Admission medications   Medication Sig Start Date End Date Taking? Authorizing Provider  meclizine  (ANTIVERT ) 25 MG tablet Take 1 tablet (25 mg total) by mouth 3 (three) times daily as needed for dizziness. 11/18/23  Yes Cleotilde Rogue, MD  ondansetron  (ZOFRAN ) 4 MG tablet Take 1 tablet (4 mg total) by mouth every 6 (six) hours. 11/18/23  Yes Cleotilde Rogue, MD  ALPRAZolam  (XANAX ) 0.5 MG tablet Take 0.25-0.5 mg by mouth daily as needed for anxiety or sleep.    [provider]  apixaban  (ELIQUIS ) 5 MG TABS tablet Take 1 tablet (5 mg total) by mouth 2 (two) times daily. 11/02/23   Alvan Dorn FALCON, MD  buPROPion  (WELLBUTRIN  XL) 300 MG 24 hr tablet Take 300 mg by mouth daily.    [provider]  diltiazem  (CARDIZEM  CD) 120 MG 24 hr capsule Take 1 capsule (120 mg total) by mouth daily. 11/02/23   Alvan Dorn FALCON, MD  Evolocumab  (REPATHA  SURECLICK) 140 MG/ML SOAJ Inject 140 mg into the skin every 14 (fourteen) days. 12/11/22    Debera Jayson MATSU, MD  furosemide  (LASIX ) 20 MG tablet Take 1 tablet (20 mg total) by mouth daily as needed for fluid. 11/02/23   Alvan Dorn FALCON, MD  levocetirizine (XYZAL) 5 MG tablet Take 5 mg by mouth every evening. 03/04/18   [provider]  MAGNESIUM PO Take 1 tablet by mouth daily as needed (cramps).    [provider]  olopatadine (PATADAY) 0.1 % ophthalmic solution Place 1 drop into both eyes daily as needed for allergies.    [provider]      Allergies    Statins and Tape    Review of Systems   Review of Systems  Neurological:  Positive for dizziness.  All other systems reviewed and are negative.   Physical Exam Updated Vital Signs BP (!) 157/66   Pulse (!) 53   Resp 12   SpO2 100%  Physical Exam Vitals and nursing note reviewed.  Constitutional:      General: She is not in acute distress.    Appearance: She is well-developed.  HENT:     Head: Normocephalic and atraumatic.     Mouth/Throat:     Pharynx: No oropharyngeal exudate.  Eyes:     General: No scleral icterus.       Right eye: No discharge.        Left eye: No discharge.     Conjunctiva/sclera:  Conjunctivae normal.     Pupils: Pupils are equal, round, and reactive to light.  Neck:     Thyroid : No thyromegaly.     Vascular: No JVD.  Cardiovascular:     Rate and Rhythm: Normal rate and regular rhythm.     Heart sounds: Normal heart sounds. No murmur heard.    No friction rub. No gallop.  Pulmonary:     Effort: Pulmonary effort is normal. No respiratory distress.     Breath sounds: Normal breath sounds. No wheezing or rales.  Abdominal:     General: Bowel sounds are normal. There is no distension.     Palpations: Abdomen is soft. There is no mass.     Tenderness: There is no abdominal tenderness.  Musculoskeletal:        General: No tenderness. Normal range of motion.     Cervical back: Normal range of motion and neck supple.     Right lower leg: No edema.      Left lower leg: No edema.  Lymphadenopathy:     Cervical: No cervical adenopathy.  Skin:    General: Skin is warm and dry.     Findings: No erythema or rash.  Neurological:     Mental Status: She is alert.     Coordination: Coordination normal.     Comments: Speech is clear, cranial nerves III through XII are intact, memory is intact, strength is normal in all 4 extremities including grips and strength at the bilateral thighs, knees and ankles to extention and flexion, sensation is intact to light touch and pinprick in all 4 extremities. Coordination as tested by finger-nose-finger is normal, no limb ataxia. Normal gait, normal reflexes at the patellar tendons bilaterally  When the patient is rolled onto her right side nystagmus is induced immediately, it fatigues over the period of about 90 seconds back to normal  Psychiatric:        Behavior: Behavior normal.     ED Results / Procedures / Treatments   Labs (all labs ordered are listed, but only abnormal results are displayed) Labs Reviewed - No data to display  EKG EKG Interpretation Date/Time:  Wednesday November 18 2023 10:46:08 EST Ventricular Rate:  60 PR Interval:  159 QRS Duration:  95 QT Interval:  399 QTC Calculation: 399 R Axis:   -33  Text Interpretation: Sinus rhythm Left axis deviation Probable anteroseptal infarct, old Borderline T abnormalities, inferior leads Confirmed by Cleotilde Rogue ([id number]) on 11/18/2023 11:29:51 AM  Radiology No results found.  Procedures Procedures    Medications Ordered in ED Medications  meclizine  (ANTIVERT ) tablet 25 mg (has no administration in time range)    ED Course/ Medical Decision Making/ A&P                                 Medical Decision Making  The patient's EKG is in normal sinus rhythm, she has otherwise unremarkable, she is agreeable to return should symptoms worsen and will be treated as an outpatient for what appears to be peripheral vertigo.  She is already on  Eliquis , she has no headache or other neurologic symptoms and a totally normal neurologic exam.  She is agreeable to the plan, meclizine  given prior to discharge        Final Clinical Impression(s) / ED Diagnoses Final diagnoses:  Peripheral vertigo, unspecified laterality    Rx / DC Orders ED Discharge Orders  Ordered    meclizine  (ANTIVERT ) 25 MG tablet  3 times daily PRN        11/18/23 1130    ondansetron  (ZOFRAN ) 4 MG tablet  Every 6 hours        11/18/23 1130              Cleotilde Rogue, MD 11/18/23 1131

## 2023-11-18 NOTE — ED Notes (Signed)
 Pt states she woke at 0700 and was fine with no symptoms. Rolled over in bed at 0743 per pt and became very dizzy. Pt has heart monitor on and able to see details on cell phone.pt states has pressure in head but its not a headache. States dizziness is intermittent and can be during movement which makes her unsteady or could be just lying down. Pt denies chest pain/palpitations. Color wnl. A/o

## 2023-11-18 NOTE — ED Triage Notes (Signed)
 Pt reports dizziness and heart racing that started at 0700 this morning. Denies pain. Hx of A-fib, takes Cardizem.

## 2023-11-19 ENCOUNTER — Telehealth: Payer: Self-pay | Admitting: Pharmacist

## 2023-11-20 ENCOUNTER — Telehealth: Payer: Self-pay | Admitting: Pharmacy Technician

## 2023-11-20 ENCOUNTER — Encounter: Payer: Self-pay | Admitting: Cardiology

## 2023-11-20 ENCOUNTER — Other Ambulatory Visit (HOSPITAL_COMMUNITY): Payer: Self-pay

## 2023-11-20 MED ORDER — APIXABAN 5 MG PO TABS: 5.0000 mg | ORAL_TABLET | Freq: Two times a day (BID) | ORAL | 1 refills | Status: DC

## 2023-11-20 MED ORDER — DILTIAZEM HCL ER COATED BEADS 120 MG PO CP24: 120.0000 mg | ORAL_CAPSULE | Freq: Every day | ORAL | 3 refills | Status: DC

## 2023-11-20 NOTE — Telephone Encounter (Signed)
 PA request has been Cancelled, no prior authorization needed. New Encounter created for follow up. For additional info see Pharmacy Prior Auth telephone encounter from 11/20/23.

## 2023-11-20 NOTE — Addendum Note (Signed)
 Addended by: Tylene Fantasia on: 11/20/2023 04:45 PM   Modules accepted: Orders

## 2023-11-20 NOTE — Telephone Encounter (Signed)
 Pharmacy Patient Advocate Encounter   Received notification from Pt Calls Messages that prior authorization for Repatha  sureclick 140mg /ml is required/requested.   Insurance verification completed.   The patient is insured through [facility] ADVANTAGE/RX ADVANCE .   Per test claim: The current 28 day co-pay is, $47.00.  No PA needed at this time. This test claim was processed through [facility]- copay amounts may vary at other pharmacies due to pharmacy/plan contracts, or as the patient moves through the different stages of their insurance plan.   I called and spoke to the patient's pharmacy and they said the patient just got the repatha  on 11/09/23 on her other insurance so they wouldn't be able to run it again until it's due; however, there shouldn't be a problem on their end he said.

## 2023-11-23 ENCOUNTER — Other Ambulatory Visit (HOSPITAL_COMMUNITY): Payer: Self-pay

## 2023-11-23 MED ORDER — REPATHA SURECLICK 140 MG/ML ~~LOC~~ SOAJ: 140.0000 mg | SUBCUTANEOUS | 3 refills | Status: DC

## 2023-11-23 MED ORDER — DILTIAZEM HCL ER COATED BEADS 120 MG PO CP24: 120.0000 mg | ORAL_CAPSULE | Freq: Every day | ORAL | 3 refills | Status: AC

## 2023-11-23 MED ORDER — APIXABAN 5 MG PO TABS: 5.0000 mg | ORAL_TABLET | Freq: Two times a day (BID) | ORAL | 1 refills | Status: DC

## 2023-11-23 NOTE — Addendum Note (Signed)
 Addended by: Tylene Fantasia on: 11/23/2023 04:01 PM   Modules accepted: Orders

## 2023-11-23 NOTE — Addendum Note (Signed)
 Addended by: Malena Peer D on: 11/23/2023 07:35 AM   Modules accepted: Orders

## 2023-11-27 ENCOUNTER — Other Ambulatory Visit: Payer: Self-pay | Admitting: Cardiology

## 2023-11-30 ENCOUNTER — Ambulatory Visit: Payer: Medicare Other | Admitting: Podiatry

## 2023-12-14 ENCOUNTER — Telehealth: Payer: Self-pay | Admitting: Pharmacy Technician

## 2023-12-14 ENCOUNTER — Other Ambulatory Visit (HOSPITAL_COMMUNITY): Payer: Self-pay

## 2023-12-14 NOTE — Telephone Encounter (Signed)
Pharmacy Patient Advocate Encounter   Received notification from Patient Advice Request messages that prior authorization for repatha is required/requested.   Insurance verification completed.   The patient is insured through [facility] ADVANTAGE/RX ADVANCE .   Per test claim: PA required; PA submitted to above mentioned insurance via CoverMyMeds Key/confirmation #/EOC [id number] Status is pending

## 2023-12-15 ENCOUNTER — Other Ambulatory Visit (HOSPITAL_COMMUNITY): Payer: Self-pay

## 2023-12-15 NOTE — Telephone Encounter (Signed)
Pharmacy Patient Advocate Encounter  Received notification from [facility] ADVANTAGE/RX ADVANCE that Prior Authorization for repatha has been APPROVED from 12/14/23 to 06/12/24   PA #/Case ID/Reference #: [id number]

## 2023-12-23 ENCOUNTER — Ambulatory Visit: Payer: HMO | Admitting: Podiatry

## 2023-12-23 ENCOUNTER — Encounter: Payer: Self-pay | Admitting: Podiatry

## 2023-12-23 DIAGNOSIS — M2042 Other hammer toe(s) (acquired), left foot: Secondary | ICD-10-CM | POA: Diagnosis not present

## 2023-12-23 DIAGNOSIS — B351 Tinea unguium: Secondary | ICD-10-CM | POA: Diagnosis not present

## 2023-12-23 DIAGNOSIS — M79676 Pain in unspecified toe(s): Secondary | ICD-10-CM | POA: Diagnosis not present

## 2023-12-23 DIAGNOSIS — M2041 Other hammer toe(s) (acquired), right foot: Secondary | ICD-10-CM | POA: Diagnosis not present

## 2023-12-23 NOTE — Progress Notes (Signed)
 Subjective:  Patient ID: Kathy Evans, female    DOB: 09/03/1951,  MRN: [id number] HPI Chief Complaint  Patient presents with   Debridement    Requesting toenail trim - tender hallux right - lateral border, concerned about hammertoes   New Patient (Initial Visit)    73 y.o. female presents with the above complaint.   ROS: Denies fever chills nausea mobic muscle aches pains calf pain back pain chest pain shortness of breath  Past Medical History:  Diagnosis Date   Arthritis    Atrial flutter [facility])    Diagnosed October 2021   Breast cancer (HCC) 03/2017   Right   Bronchitis    Depression    GERD (gastroesophageal reflux disease)    Heart murmur    Hyperlipidemia    IBS (irritable bowel syndrome)    Impingement syndrome of left shoulder    Personal history of radiation therapy 2018   Past Surgical History:  Procedure Laterality Date   ADENOIDECTOMY     APPENDECTOMY     BREAST BIOPSY Left    benign   BREAST LUMPECTOMY Right 2018   BUNIONECTOMY Right    CARDIAC CATHETERIZATION     CATARACT EXTRACTION, BILATERAL     CHOLECYSTECTOMY     colon adhesion     COLONOSCOPY WITH PROPOFOL  N/A 02/02/2018   Dr. Harvey: multiple small and large-mouthed diverticula in recto-sigmoid colon, sigmoid, and descending. TI normal. Internal hemorrhoids during retroflexion, small. Moderate external hemorrhoids   Full mastectomy Right    MASTECTOMY, PARTIAL Right    PROLAPSED UTERINE FIBROID LIGATION     REPLACEMENT TOTAL KNEE Right 05/30/2022   TOE SURGERY Left    TONSILLECTOMY     TOTAL KNEE ARTHROPLASTY Right 05/30/2022   Procedure: TOTAL KNEE ARTHROPLASTY;  Surgeon: Shari Sieving, MD;  Location: [facility];  Service: Orthopedics;  Laterality: Right;   TUBAL LIGATION     VAGINAL HYSTERECTOMY     VESICOVAGINAL FISTULA CLOSURE      Current Outpatient Medications:    Cholecalciferol (D3 EXTRA STRENGTH PO), Take by mouth., Disp: , Rfl:    Pyridoxine HCl (B-6 PO), Take by mouth., Disp: ,  Rfl:    ALPRAZolam  (XANAX ) 0.5 MG tablet, Take 0.25-0.5 mg by mouth daily as needed for anxiety or sleep., Disp: , Rfl:    apixaban  (ELIQUIS ) 5 MG TABS tablet, Take 1 tablet (5 mg total) by mouth 2 (two) times daily., Disp: 180 tablet, Rfl: 1   buPROPion  (WELLBUTRIN  XL) 300 MG 24 hr tablet, Take 300 mg by mouth daily., Disp: , Rfl:    diltiazem  (CARDIZEM  CD) 120 MG 24 hr capsule, Take 1 capsule (120 mg total) by mouth daily., Disp: 100 capsule, Rfl: 3   Evolocumab  (REPATHA  SURECLICK) 140 MG/ML SOAJ, Inject 140 mg into the skin every 14 (fourteen) days., Disp: 6 mL, Rfl: 3   furosemide  (LASIX ) 20 MG tablet, Take 1 tablet (20 mg total) by mouth daily as needed for fluid., Disp: 30 tablet, Rfl: 1   levocetirizine (XYZAL) 5 MG tablet, Take 5 mg by mouth every evening., Disp: , Rfl:    MAGNESIUM PO, Take 1 tablet by mouth daily as needed (cramps)., Disp: , Rfl:    olopatadine (PATADAY) 0.1 % ophthalmic solution, Place 1 drop into both eyes daily as needed for allergies., Disp: , Rfl:   Allergies  Allergen Reactions   Statins Other (See Comments)    Myalgias Arthralgias    Imipramine     Other Reaction(s): Not available   Sulfamethoxazole  Other Reaction(s): Not available   Tape Itching    Redness  Red and itchy   Review of Systems Objective:  There were no vitals filed for this visit.  General: Well developed, nourished, in no acute distress, alert and oriented x3   Dermatological: Skin is warm, dry and supple bilateral. Nails x 10 are well maintained; remaining integument appears unremarkable at this time. There are no open sores, no preulcerative lesions, no rash or signs of infection present.  Onychocryptosis to the hallux right particular on the fibular border consistent with a mild ingrown toenail.  No purulence no malodor no erythema.  Just mild tenderness on palpation.  Vascular: Dorsalis Pedis artery and Posterior Tibial artery pedal pulses are 2/4 bilateral with immedate  capillary fill time. Pedal hair growth present. No varicosities and no lower extremity edema present bilateral.   Neruologic: Grossly intact via light touch bilateral. Vibratory intact via tuning fork bilateral. Protective threshold with Semmes Wienstein monofilament intact to all pedal sites bilateral. Patellar and Achilles deep tendon reflexes 2+ bilateral. No Babinski or clonus noted bilateral.   Musculoskeletal: No gross boney pedal deformities bilateral. No pain, crepitus, or limitation noted with foot and ankle range of motion bilateral. Muscular strength 5/5 in all groups tested bilateral.  Gait: Unassisted, Nonantalgic.    Radiographs:  None taken  Assessment & Plan:   Assessment: Onychocryptosis ingrown toenail.  Nail dystrophy 1 through 5 bilateral  Plan: Debridement of toenails 1 through 5 bilateral     Blandina Renaldo T. [place], [place]

## 2024-01-08 ENCOUNTER — Ambulatory Visit (HOSPITAL_COMMUNITY): Payer: HMO

## 2024-01-20 ENCOUNTER — Ambulatory Visit (HOSPITAL_COMMUNITY)
Admission: RE | Admit: 2024-01-20 | Discharge: 2024-01-20 | Disposition: A | Discharge status: Home / Self Care | Payer: HMO | Source: Ambulatory Visit | Attending: Hematology | Admitting: Hematology

## 2024-01-20 DIAGNOSIS — Z170 Estrogen receptor positive status [ER+]: Secondary | ICD-10-CM | POA: Insufficient documentation

## 2024-01-20 DIAGNOSIS — C50911 Malignant neoplasm of unspecified site of right female breast: Secondary | ICD-10-CM | POA: Diagnosis not present

## 2024-01-20 DIAGNOSIS — Z1231 Encounter for screening mammogram for malignant neoplasm of breast: Secondary | ICD-10-CM | POA: Diagnosis not present

## 2024-01-28 ENCOUNTER — Encounter: Payer: Self-pay | Admitting: Adult Health

## 2024-01-28 ENCOUNTER — Ambulatory Visit: Payer: HMO | Admitting: Adult Health

## 2024-01-28 VITALS — BP 148/70 | HR 88 | Ht 64.0 in | Wt 181.0 lb

## 2024-01-28 DIAGNOSIS — D239 Other benign neoplasm of skin, unspecified: Secondary | ICD-10-CM | POA: Diagnosis not present

## 2024-01-28 DIAGNOSIS — Z853 Personal history of malignant neoplasm of breast: Secondary | ICD-10-CM | POA: Diagnosis not present

## 2024-01-28 DIAGNOSIS — R42 Dizziness and giddiness: Secondary | ICD-10-CM | POA: Diagnosis not present

## 2024-01-28 DIAGNOSIS — Z0000 Encounter for general adult medical examination without abnormal findings: Secondary | ICD-10-CM

## 2024-01-28 DIAGNOSIS — R030 Elevated blood-pressure reading, without diagnosis of hypertension: Secondary | ICD-10-CM | POA: Diagnosis not present

## 2024-01-28 DIAGNOSIS — Z1331 Encounter for screening for depression: Secondary | ICD-10-CM

## 2024-01-28 DIAGNOSIS — Z01419 Encounter for gynecological examination (general) (routine) without abnormal findings: Secondary | ICD-10-CM | POA: Insufficient documentation

## 2024-01-28 DIAGNOSIS — Z90710 Acquired absence of both cervix and uterus: Secondary | ICD-10-CM | POA: Diagnosis not present

## 2024-01-28 NOTE — Progress Notes (Signed)
 Patient ID: Kathy Evans, female   DOB: [birth year], 73 y.o.   MRN: [id number] History of Present Illness: Kathy Evans is a 73 year old white female, married, sp hysterectomy in for a well woman gyn exam and has noticed dark spots on vulva. She has vertigo today. She has history of breast cancer and sees Dr Kirtland Bouchard.  She is having mixed UI but wears a pad.  PCP is Dr Margo Aye.    Current Medications, Allergies, Past Medical History, Past Surgical History, Family History and Social History were reviewed in [employer] record.     Review of Systems: Patient denies any headaches, hearing loss, fatigue, blurred vision, shortness of breath, chest pain, abdominal pain, problems with bowel movements, urination, or intercourse(not active). No joint pain or mood swings.  Has mixed UI and wears a pad   Physical Exam:BP (!) 148/70 (BP Location: Left Arm, Patient Position: Sitting, Cuff Size: Normal)   Pulse 88   Ht 5\' 4"  (1.626 m)   Wt 181 lb (82.1 kg)   BMI 31.07 kg/m   General:  Well developed, well nourished, no acute distress Skin:  Warm and dry Neck:  Midline trachea, normal thyroid, good ROM, no lymphadenopathy,no carotid bruits heard Lungs; Clear to auscultation bilaterally Breast:  No dominant palpable mass, retraction, or nipple discharge, has scar right breast Cardiovascular: Regular rate and rhythm,mild murmur Abdomen:  Soft, non tender, no hepatosplenomegaly Pelvic:  External genitalia is normal in appearance, has several angiokeratomas.  The vagina is pale. Urethra has no lesions or masses. The cervix and uterus are absent.  No adnexal masses or tenderness noted.Bladder is non tender, no masses felt. Rectal: Deferred Extremities/musculoskeletal:  No swelling, +varicosities noted, no clubbing or cyanosis Psych:  No mood changes, alert and cooperative,seems happy AA is 1 Fall risk is moderate    01/28/2024    9:52 AM 12/11/2020    8:52 AM 09/25/2017    9:39 AM  Depression  screen PHQ 2/9  Decreased Interest 0 0 0  Down, Depressed, Hopeless 0 0 0  PHQ - 2 Score 0 0 0  Altered sleeping 0  0  Tired, decreased energy 0  0  Change in appetite 0  0  Feeling bad or failure about yourself  0  0  Trouble concentrating 0  0  Moving slowly or fidgety/restless 0  0  Suicidal thoughts 0  0  PHQ-9 Score 0  0  Difficult doing work/chores   Not difficult at all       01/28/2024    9:52 AM  GAD 7 : Generalized Anxiety Score  Nervous, Anxious, on Edge 0  Control/stop worrying 0  Worry too much - different things 0  Trouble relaxing 0  Restless 0  Easily annoyed or irritable 0  Afraid - awful might happen 0  Total GAD 7 Score 0      Upstream - 01/28/24 0959       Pregnancy Intention Screening   Does the patient want to become pregnant in the next year? N/A    Does the patient's partner want to become pregnant in the next year? N/A    Would the patient like to discuss contraceptive options today? N/A      Contraception Wrap Up   Current Method Female Sterilization   hyst   End Method Female Sterilization   hyst   Contraception Counseling Provided No            Examination chaperoned by Malachy Mood LPN  Impression and plan: 1. Encounter for well woman exam with routine gynecological exam (Primary) Physical with PCP Labs with PCP Mammogram was negative 01/20/24 Has had last colonoscopy she says Follow up prn   2. S/P hysterectomy  3. History of breast cancer Sees Dr Kirtland Bouchard  Had Normal mammogram 01/20/24  4. Vertigo Has meds   5. Angiokeratoma Leave alone  6. Elevated BP without diagnosis of hypertension  Follow up with PCP

## 2024-02-03 DIAGNOSIS — J302 Other seasonal allergic rhinitis: Secondary | ICD-10-CM | POA: Diagnosis not present

## 2024-02-03 DIAGNOSIS — K219 Gastro-esophageal reflux disease without esophagitis: Secondary | ICD-10-CM | POA: Diagnosis not present

## 2024-02-03 DIAGNOSIS — E669 Obesity, unspecified: Secondary | ICD-10-CM | POA: Diagnosis not present

## 2024-02-03 DIAGNOSIS — I480 Paroxysmal atrial fibrillation: Secondary | ICD-10-CM | POA: Diagnosis not present

## 2024-02-03 DIAGNOSIS — Z8719 Personal history of other diseases of the digestive system: Secondary | ICD-10-CM | POA: Diagnosis not present

## 2024-02-03 DIAGNOSIS — E559 Vitamin D deficiency, unspecified: Secondary | ICD-10-CM | POA: Diagnosis not present

## 2024-02-03 DIAGNOSIS — F331 Major depressive disorder, recurrent, moderate: Secondary | ICD-10-CM | POA: Diagnosis not present

## 2024-02-03 DIAGNOSIS — R42 Dizziness and giddiness: Secondary | ICD-10-CM | POA: Diagnosis not present

## 2024-02-03 DIAGNOSIS — K581 Irritable bowel syndrome with constipation: Secondary | ICD-10-CM | POA: Diagnosis not present

## 2024-02-03 DIAGNOSIS — I10 Essential (primary) hypertension: Secondary | ICD-10-CM | POA: Diagnosis not present

## 2024-02-03 DIAGNOSIS — E782 Mixed hyperlipidemia: Secondary | ICD-10-CM | POA: Diagnosis not present

## 2024-02-03 DIAGNOSIS — Z853 Personal history of malignant neoplasm of breast: Secondary | ICD-10-CM | POA: Diagnosis not present

## 2024-02-24 DIAGNOSIS — H8112 Benign paroxysmal vertigo, left ear: Secondary | ICD-10-CM | POA: Diagnosis not present

## 2024-02-26 DIAGNOSIS — H524 Presbyopia: Secondary | ICD-10-CM | POA: Diagnosis not present

## 2024-02-26 DIAGNOSIS — H5212 Myopia, left eye: Secondary | ICD-10-CM | POA: Diagnosis not present

## 2024-02-26 DIAGNOSIS — Z961 Presence of intraocular lens: Secondary | ICD-10-CM | POA: Diagnosis not present

## 2024-03-02 DIAGNOSIS — Z8719 Personal history of other diseases of the digestive system: Secondary | ICD-10-CM | POA: Diagnosis not present

## 2024-03-02 DIAGNOSIS — I10 Essential (primary) hypertension: Secondary | ICD-10-CM | POA: Diagnosis not present

## 2024-03-02 DIAGNOSIS — Z0000 Encounter for general adult medical examination without abnormal findings: Secondary | ICD-10-CM | POA: Diagnosis not present

## 2024-03-02 DIAGNOSIS — N3289 Other specified disorders of bladder: Secondary | ICD-10-CM | POA: Diagnosis not present

## 2024-03-02 DIAGNOSIS — K581 Irritable bowel syndrome with constipation: Secondary | ICD-10-CM | POA: Diagnosis not present

## 2024-03-02 DIAGNOSIS — E669 Obesity, unspecified: Secondary | ICD-10-CM | POA: Diagnosis not present

## 2024-03-02 DIAGNOSIS — K219 Gastro-esophageal reflux disease without esophagitis: Secondary | ICD-10-CM | POA: Diagnosis not present

## 2024-03-02 DIAGNOSIS — R42 Dizziness and giddiness: Secondary | ICD-10-CM | POA: Diagnosis not present

## 2024-03-02 DIAGNOSIS — F331 Major depressive disorder, recurrent, moderate: Secondary | ICD-10-CM | POA: Diagnosis not present

## 2024-03-02 DIAGNOSIS — Z853 Personal history of malignant neoplasm of breast: Secondary | ICD-10-CM | POA: Diagnosis not present

## 2024-03-02 DIAGNOSIS — M8580 Other specified disorders of bone density and structure, unspecified site: Secondary | ICD-10-CM | POA: Diagnosis not present

## 2024-03-02 DIAGNOSIS — I480 Paroxysmal atrial fibrillation: Secondary | ICD-10-CM | POA: Diagnosis not present

## 2024-03-02 DIAGNOSIS — H8112 Benign paroxysmal vertigo, left ear: Secondary | ICD-10-CM | POA: Diagnosis not present

## 2024-03-09 ENCOUNTER — Ambulatory Visit: Attending: Cardiology

## 2024-03-09 ENCOUNTER — Telehealth: Payer: Self-pay | Admitting: Cardiology

## 2024-03-09 DIAGNOSIS — I480 Paroxysmal atrial fibrillation: Secondary | ICD-10-CM

## 2024-03-09 NOTE — Telephone Encounter (Signed)
 Reports SOB when moving around that started today and thinks she is in A-fib. Denies palpitations, flutters, dizziness, or chest pain. Reports she had diarrhea this morning. Denies fever, or N/V. Reports blood pressure has been fluctuating.Readings today for BP 109/52 & HR 66 & BP 133/62 & HR 63. Does not have a Paediatric nurse for home EKG's. Advised that she could come in for a nurse visit to get an EKG. Agree and request to have it done at the [place] office since its closer for her. Advised if she develops worsening symptoms, to go to the ED for an evaluation. Verbalized understanding of plan.   Advised that she has been scheduled for an EKG at the [place] office today at 4 pm. Verbalized understanding.

## 2024-03-09 NOTE — Telephone Encounter (Signed)
 Pt c/o Shortness Of Breath: STAT if SOB developed within the last 24 hours or pt is noticeably SOB on the phone  1. Are you currently SOB (can you hear that pt is SOB on the phone)? Yes  2. How long have you been experiencing SOB? This morning   3. Are you SOB when sitting or when up moving around? Up and moving around   4. Are you currently experiencing any other symptoms? No

## 2024-03-09 NOTE — Progress Notes (Signed)
 Patient presents today for EKG- A Fib. Will route to provider to view.

## 2024-03-18 ENCOUNTER — Other Ambulatory Visit: Payer: Self-pay | Admitting: Cardiology

## 2024-03-28 ENCOUNTER — Ambulatory Visit: Payer: Medicare Other | Attending: Cardiology | Admitting: Cardiology

## 2024-03-28 ENCOUNTER — Encounter: Payer: Self-pay | Admitting: Cardiology

## 2024-03-28 VITALS — BP 130/70 | HR 72 | Ht 64.0 in | Wt 182.6 lb

## 2024-03-28 DIAGNOSIS — Z789 Other specified health status: Secondary | ICD-10-CM | POA: Diagnosis not present

## 2024-03-28 DIAGNOSIS — I4892 Unspecified atrial flutter: Secondary | ICD-10-CM | POA: Diagnosis not present

## 2024-03-28 DIAGNOSIS — I471 Supraventricular tachycardia, unspecified: Secondary | ICD-10-CM

## 2024-03-28 DIAGNOSIS — E782 Mixed hyperlipidemia: Secondary | ICD-10-CM | POA: Diagnosis not present

## 2024-03-28 NOTE — Patient Instructions (Addendum)

## 2024-03-28 NOTE — Progress Notes (Signed)
    Cardiology Office Note  Date: 03/28/2024   ID: Mckennzie Gort, DOB [birth year], MRN [id number]  History of Present Illness: Kathy Evans is a 73 y.o. female last seen in October 2024.  She is here with her husband for a follow-up visit.  She reports one significant episode of shortness of breath and presumably recurrent atrial arrhythmia in late April, although not captured by ECG in the office as symptoms had resolved at that time.  Tracing is reviewed below.  She is considering getting a Kardia mobile monitor.  No exertional chest pain, no sudden dizziness or syncope.  She did have an episode of vertigo back in January, resolved.  We went over her medications.  She reports compliance with her current regimen.  I rechecked her blood pressure today at 130/70.  I did ask her to check this with arm cuff at home.  She remains on Repatha .  Physical Exam: VS:  BP 130/70 (BP Location: Left Arm)   Pulse 72   Ht 5\' 4"  (1.626 m)   Wt 182 lb 9.6 oz (82.8 kg)   SpO2 97%   BMI 31.34 kg/m , BMI Body mass index is 31.34 kg/m.  Wt Readings from Last 3 Encounters:  03/28/24 182 lb 9.6 oz (82.8 kg)  01/28/24 181 lb (82.1 kg)  09/14/23 178 lb 9.6 oz (81 kg)    General: Patient appears comfortable at rest. HEENT: Conjunctiva and lids normal. Neck: Supple, no elevated JVP or carotid bruits. Lungs: Clear to auscultation, nonlabored breathing at rest. Cardiac: Regular rate and rhythm, no S3 or significant systolic murmur.  ECG:  An ECG dated 03/09/2024 was personally reviewed today and demonstrated:  Sinus rhythm with leftward axis, nonspecific ST changes.  Labwork: 07/05/2023: ALT 18; AST 17; BUN 14; Creatinine, Ser 0.82; Hemoglobin 14.1; Magnesium 2.1; Platelets 219; Potassium 3.7; Sodium 138; TSH 2.267  March 2025: TSH 1.53, hemoglobin A1c 5.2%, cholesterol 190, triglycerides 81, HDL 65, LDL 110, BUN 11, creatinine 0.82, potassium 5.4, GFR 75, AST 20, ALT 17, hemoglobin 14.2, platelets  278  Other Studies Reviewed Today:  No interval cardiac testing for review today.  Assessment and Plan:  1.  History of paroxysmal atrial flutter and intermittent PSVT with CHA2DS2-VASc score of 2.  She reports no definite acceleration in symptoms, did have one episode in late April as discussed above.  For now we will continue Cardizem  CD 120 mg daily and Eliquis  5 mg twice daily.  Could further uptitrate calcium channel blocker if necessary, we have also discussed EP consultation.   2.  Severe mixed hyperlipidemia with multi-statin intolerance as well as intolerance to Zetia.  Continue Repatha  140 mg/mL every 14 days.  Her most recent LDL was 110 in March.  Disposition:  Follow up 6 months.  Signed, Gerard Knight, M.D., F.A.C.C. [facility]

## 2024-03-30 ENCOUNTER — Ambulatory Visit: Admitting: Podiatry

## 2024-03-30 ENCOUNTER — Encounter: Payer: Self-pay | Admitting: Podiatry

## 2024-03-30 DIAGNOSIS — L600 Ingrowing nail: Secondary | ICD-10-CM

## 2024-03-30 DIAGNOSIS — B351 Tinea unguium: Secondary | ICD-10-CM

## 2024-03-30 DIAGNOSIS — M2041 Other hammer toe(s) (acquired), right foot: Secondary | ICD-10-CM

## 2024-03-30 DIAGNOSIS — M79676 Pain in unspecified toe(s): Secondary | ICD-10-CM | POA: Diagnosis not present

## 2024-03-30 DIAGNOSIS — M2042 Other hammer toe(s) (acquired), left foot: Secondary | ICD-10-CM

## 2024-03-30 MED ORDER — NEOMYCIN-POLYMYXIN-HC 1 % OT SOLN: OTIC | 1 refills | Status: DC

## 2024-03-30 NOTE — Patient Instructions (Signed)

## 2024-03-30 NOTE — Progress Notes (Signed)
 She presents today with her husband with chief complaint of a painful ingrown toenail to the fibular border of the hallux right.  She is also concerned about her other toenails which are long and painful as well.  She had questions about hammertoes.  Objective: Vital signs are stable alert oriented x 3 I reviewed her past medical history medications allergies surgery social history currently taking Eliquis .  Sharp incurvated nail margin along the fibular border of the hallux right secondary to her second toe pressing down on it.  There is mild erythema no purulence no malodor no signs of infection.  Just painful ingrown toenail.  Otherwise her toenails are long thick yellow dystrophic possibly mycotic.  They are tender on palpation as well as debridement.  She does have flexible hammertoe deformities which are asymptomatic at this point.  Assessment: Pain in limb secondary to ingrown toenail, pain limb secondary to onychomycosis and hammertoes.  Plan: Chemical matricectomy was performed today after local anesthetic was administered the fibular border was reduced resected and phenol was applied to the hallux right.  Is neutralized with isopropyl alcohol Silvadene cream Telfa pad and Surgicel was applied.  She was given both oral and written home-going instruction for the care and soaking of the toe as well as prescription for Cortisporin Otic to be applied twice daily after soaking.  Toenails were debrided 1 through 5 bilateral covered service secondary to pain follow-up with her in 2 weeks

## 2024-04-06 ENCOUNTER — Ambulatory Visit

## 2024-04-06 DIAGNOSIS — L508 Other urticaria: Secondary | ICD-10-CM | POA: Diagnosis not present

## 2024-04-06 DIAGNOSIS — L503 Dermatographic urticaria: Secondary | ICD-10-CM | POA: Diagnosis not present

## 2024-04-13 ENCOUNTER — Ambulatory Visit: Admitting: Podiatry

## 2024-04-13 ENCOUNTER — Encounter: Payer: Self-pay | Admitting: Podiatry

## 2024-04-13 DIAGNOSIS — L600 Ingrowing nail: Secondary | ICD-10-CM

## 2024-04-13 DIAGNOSIS — Z9889 Other specified postprocedural states: Secondary | ICD-10-CM

## 2024-04-13 NOTE — Progress Notes (Signed)
 She presents today for nail check hallux right.  States that is doing quite well no problems whatsoever there is no erythema edema cellulitis drainage or odor has not given me any problem at all.  Objective: Vitals are stable alert oriented x 3.  Assessment: Well-healing surgical toe hallux fibular border.  Plan: I recommended that she may want to continue to soak every other day Epsom salts and warm water  covered in the day but leave open at bedtime.  Follow-up with me on an as-needed basis or with any regression.

## 2024-04-27 ENCOUNTER — Telehealth: Payer: Self-pay | Admitting: Pharmacy Technician

## 2024-04-27 NOTE — Telephone Encounter (Signed)
    Try to run pa closer to 06/12/24

## 2024-05-09 ENCOUNTER — Other Ambulatory Visit: Payer: Self-pay | Admitting: Cardiology

## 2024-05-09 NOTE — Telephone Encounter (Signed)
 Prescription refill request for Eliquis  received. Indication:aflutter Last office visit:5/25 Scr:0.82  3/25 Age: 73 Weight:82.8  kg  Prescription refilled

## 2024-05-23 ENCOUNTER — Telehealth: Payer: Self-pay | Admitting: Pharmacy Technician

## 2024-05-23 NOTE — Telephone Encounter (Signed)
   Still not letting renewal

## 2024-05-24 ENCOUNTER — Ambulatory Visit (HOSPITAL_COMMUNITY)
Admission: RE | Admit: 2024-05-24 | Discharge: 2024-05-24 | Disposition: A | Discharge status: Home / Self Care | Source: Ambulatory Visit | Attending: Hematology | Admitting: Hematology

## 2024-05-24 DIAGNOSIS — C50911 Malignant neoplasm of unspecified site of right female breast: Secondary | ICD-10-CM | POA: Diagnosis present

## 2024-05-24 DIAGNOSIS — M8589 Other specified disorders of bone density and structure, multiple sites: Secondary | ICD-10-CM | POA: Insufficient documentation

## 2024-05-24 DIAGNOSIS — Z780 Asymptomatic menopausal state: Secondary | ICD-10-CM | POA: Diagnosis not present

## 2024-05-24 DIAGNOSIS — Z170 Estrogen receptor positive status [ER+]: Secondary | ICD-10-CM | POA: Diagnosis present

## 2024-05-24 DIAGNOSIS — Z13820 Encounter for screening for osteoporosis: Secondary | ICD-10-CM | POA: Diagnosis not present

## 2024-05-24 DIAGNOSIS — Z853 Personal history of malignant neoplasm of breast: Secondary | ICD-10-CM | POA: Insufficient documentation

## 2024-06-21 ENCOUNTER — Encounter: Payer: Self-pay | Admitting: Cardiology

## 2024-06-21 ENCOUNTER — Telehealth: Payer: Self-pay

## 2024-06-21 ENCOUNTER — Other Ambulatory Visit (HOSPITAL_COMMUNITY): Payer: Self-pay

## 2024-06-21 NOTE — Telephone Encounter (Signed)
 Pharmacy Patient Advocate Encounter   Received notification from Physician's Office that prior authorization for REPATHA  is required/requested.   Insurance verification completed.   The patient is insured through [facility] ADVANTAGE/RX ADVANCE .   Per test claim: PA required; PA submitted to above mentioned insurance via CoverMyMeds Key/confirmation #/EOC [employer] Status is pending

## 2024-06-21 NOTE — Telephone Encounter (Signed)
 PA request has been Submitted. New Encounter has been or will be created for follow up. For additional info see Pharmacy Prior Auth telephone encounter from 06/21/24.

## 2024-06-22 NOTE — Telephone Encounter (Signed)
 Scanned in media.

## 2024-06-23 ENCOUNTER — Other Ambulatory Visit (HOSPITAL_COMMUNITY): Payer: Self-pay

## 2024-06-23 NOTE — Telephone Encounter (Signed)
 Pharmacy Patient Advocate Encounter  Received notification from [employer]/RX ADVANCE that Prior Authorization for REPATHA  has been APPROVED from 06/21/24 to 06/21/25

## 2024-06-23 NOTE — Telephone Encounter (Signed)
 Patient notified via mychart

## 2024-06-29 ENCOUNTER — Ambulatory Visit: Admitting: Podiatry

## 2024-06-29 DIAGNOSIS — B351 Tinea unguium: Secondary | ICD-10-CM | POA: Diagnosis not present

## 2024-06-29 DIAGNOSIS — M79676 Pain in unspecified toe(s): Secondary | ICD-10-CM

## 2024-06-29 NOTE — Progress Notes (Signed)
 She presents today chief complaint of painful elongated toenails.  States that her great toe right foot is doing well with no problems.  So the other nails are long and need some help with trimming.  Objective: Vital signs are stable she is alert and oriented x 3.  Pulses are palpable.  There is no erythema edema cellulitis drainage or odor.  Toenails are long thick yellow dystrophic clinically mycotic.  Assessment: Pain limb secondary to onychomycosis.  Plan: Debridement of toenails 1 through 5 bilateral.

## 2024-07-01 DIAGNOSIS — M25552 Pain in left hip: Secondary | ICD-10-CM | POA: Diagnosis not present

## 2024-07-07 DIAGNOSIS — M25552 Pain in left hip: Secondary | ICD-10-CM | POA: Diagnosis not present

## 2024-07-08 ENCOUNTER — Other Ambulatory Visit: Payer: Self-pay | Admitting: Cardiology

## 2024-07-20 ENCOUNTER — Inpatient Hospital Stay

## 2024-07-20 ENCOUNTER — Inpatient Hospital Stay: Payer: Medicare Other | Admitting: Oncology

## 2024-07-20 ENCOUNTER — Other Ambulatory Visit: Payer: Self-pay | Admitting: *Deleted

## 2024-07-20 DIAGNOSIS — C50911 Malignant neoplasm of unspecified site of right female breast: Secondary | ICD-10-CM | POA: Diagnosis not present

## 2024-07-20 DIAGNOSIS — M8580 Other specified disorders of bone density and structure, unspecified site: Secondary | ICD-10-CM

## 2024-07-20 DIAGNOSIS — Z170 Estrogen receptor positive status [ER+]: Secondary | ICD-10-CM

## 2024-07-20 DIAGNOSIS — N3289 Other specified disorders of bladder: Secondary | ICD-10-CM

## 2024-07-20 NOTE — Assessment & Plan Note (Deleted)
-   She could only tolerate anastrozole for 4 months and letrozole  for few weeks. - Mammogram on 01/05/2023 was BI-RADS Category 1. - Recommend repeating mammogram on 01/06/2024.

## 2024-07-20 NOTE — Assessment & Plan Note (Deleted)
-   CTAP (12/26/2022): At [facility] reviewed by me shows 1.1 cm hyperattenuating lesion imperceptible from the left anterior bladder wall, which may represent an exophytic mass versus remnant urachal cyst versus calcified lymph node. - She had a cystoscopy with Dr. Chales which was normal. - She denies any hematuria since last visit. - Reviewed CT pelvis from 07/11/2023: Stable small calcified nodular lesion anterior to the left aspect of the bladder.  Sigmoid diverticulosis. - She has a follow-up with her urologist.  I would not recommend any further imaging at this time.

## 2024-07-20 NOTE — Assessment & Plan Note (Deleted)
-   She has stopped taking vitamin D about 3 months ago. - Will check vitamin D level at next visit in 1 year.

## 2024-07-20 NOTE — Progress Notes (Signed)
 Created in error

## 2024-07-22 ENCOUNTER — Other Ambulatory Visit: Payer: Self-pay

## 2024-07-22 ENCOUNTER — Inpatient Hospital Stay: Attending: Oncology | Admitting: Oncology

## 2024-07-22 ENCOUNTER — Encounter: Payer: Self-pay | Admitting: Oncology

## 2024-07-22 DIAGNOSIS — M8580 Other specified disorders of bone density and structure, unspecified site: Secondary | ICD-10-CM | POA: Diagnosis not present

## 2024-07-22 DIAGNOSIS — C50911 Malignant neoplasm of unspecified site of right female breast: Secondary | ICD-10-CM

## 2024-07-22 DIAGNOSIS — Z170 Estrogen receptor positive status [ER+]: Secondary | ICD-10-CM | POA: Diagnosis not present

## 2024-07-22 NOTE — Assessment & Plan Note (Addendum)
-   She could only tolerate anastrozole for 4 months and letrozole  for few weeks. - Mammogram on 01/20/24 was BI-RADS Category 1. - Recommend repeating mammogram in March 2026.  -Return to clinic annually for labs and see NP in clinic.

## 2024-07-22 NOTE — Assessment & Plan Note (Addendum)
-   She is currently taking 2000 units of vitamin D daily. -Most recent labs show vitamin D of 43.4 which is a slight dip from previous. -Discussed increasing to 3000 units of vitamin D daily.   -Recheck annually.

## 2024-07-22 NOTE — Progress Notes (Signed)
 [facility] OFFICE PROGRESS NOTE  Shona Norleen PEDLAR, MD  ASSESSMENT & PLAN:   I connected with Emeri Estill on 07/22/24 at  1:15 PM EDT by telephone visit and verified that I am speaking with the correct person using two identifiers.   I discussed the limitations, risks, security and privacy concerns of performing an evaluation and management service by telemedicine and the availability of in-person appointments. I also discussed with the patient that there may be a patient responsible charge related to this service. The patient expressed understanding and agreed to proceed.   Other persons participating in the visit and their role in the encounter: NP, Patient    Patient's location: Home  Provider's location: Clinic   Assessment & Plan Ductal carcinoma of right breast, stage 1, estrogen receptor positive (HCC) - She could only tolerate anastrozole for 4 months and letrozole  for few weeks. - Mammogram on 01/20/24 was BI-RADS Category 1. - Recommend repeating mammogram in March 2026.  -Return to clinic annually for labs and see NP in clinic. Osteopenia, unspecified location - She is currently taking 2000 units of vitamin D daily. -Most recent labs show vitamin D of 43.4 which is a slight dip from previous. -Discussed increasing to 3000 units of vitamin D daily.   -Recheck annually.  Orders Placed This Encounter  Procedures   MM 3D SCREENING MAMMOGRAM BILATERAL BREAST    No implants NAS No devices    Standing Status:   Future    Expected Date:   01/19/2025    Expiration Date:   07/22/2025    Reason for Exam (SYMPTOM  OR DIAGNOSIS REQUIRED):   screening for breast cancer    Preferred imaging location?:   [facility]    INTERVAL HISTORY: Patient returns for follow-up.  Denies any new concerning lumps or bumps.  In the interim, she had a bone density scan on 05/24/2024 which showed T-score of -1.7 which is osteopenia.  She had a mammogram on 01/20/2024 which was  read as BI-RADS Category 1 negative.  Reports she has been doing well since her last visit.  She does report return of hot flashes not daily but several times per week.  Reports she has not started taking anything new.  She is wondering if she is able to take something all-natural such as black cohosh for her symptoms.  She is currently not taking an aromatase inhibitor due to intolerance.  She is taking vitamin D supplements 2000 units/day.  We reviewed vitamin D, CMP and CBC.  SUMMARY OF HEMATOLOGIC HISTORY: Oncology History   No history exists.    1.  Stage Ia right breast IDC: -Status post lumpectomy and SLNB on 04/30/2017 in [place] .  Completed adjuvant radiation on 07/08/2017. - Anastrozole started around 06/03/2017, discontinued secondary to side effects after taking 6 months.  Femara  was also discontinued secondary to dry vaginitis within a few weeks. -Right breast biopsy on 10/12/2018 shows scant benign adipose tissue with fibrosis with no malignancy. -Mammogram on 10/18/2019 was BI-RADS Category 2.   2.  Osteopenia: -Bone density on 08/21/2017 shows T score -1.1. - DEXA scan (09/10/2021): T-score -1.5 -Bone density from 05/24/2024 showed a T-score of -1.7.  CBC    Component Value Date/Time   WBC 4.7 07/05/2023 0659   RBC 4.56 07/05/2023 0659   HGB 14.1 07/05/2023 0659   HGB 14.3 01/26/2020 0823   HCT 43.0 07/05/2023 0659   HCT 43.3 01/26/2020 0823   PLT 219 07/05/2023 0659  PLT 244 01/26/2020 0823   MCV 94.3 07/05/2023 0659   MCV 94 01/26/2020 0823   MCH 30.9 07/05/2023 0659   MCHC 32.8 07/05/2023 0659   RDW 13.2 07/05/2023 0659   RDW 12.8 01/26/2020 0823   LYMPHSABS 1.4 01/05/2023 1316   LYMPHSABS 1.2 01/26/2020 0823   MONOABS 0.5 01/05/2023 1316   EOSABS 0.2 01/05/2023 1316   EOSABS 0.2 01/26/2020 0823   BASOSABS 0.1 01/05/2023 1316   BASOSABS 0.1 01/26/2020 0823       Latest Ref Rng & Units 07/05/2023    6:59 AM 01/05/2023    1:16 PM 08/14/2022    9:01 PM   CMP  Glucose 70 - 99 mg/dL 91  93  878   BUN 8 - 23 mg/dL 14  19  17    Creatinine 0.44 - 1.00 mg/dL 9.17  9.17  9.16   Sodium 135 - 145 mmol/L 138  139  141   Potassium 3.5 - 5.1 mmol/L 3.7  4.4  4.2   Chloride 98 - 111 mmol/L 106  104  106   CO2 22 - 32 mmol/L 25  26  25    Calcium 8.9 - 10.3 mg/dL 9.1  8.9  9.4   Total Protein 6.5 - 8.1 g/dL 6.8  7.0  6.9   Total Bilirubin 0.3 - 1.2 mg/dL 0.4  0.2  0.7   Alkaline Phos 38 - 126 U/L 75  72  79   AST 15 - 41 U/L 17  18  14    ALT 0 - 44 U/L 18  14  13       No results found for: FERRITIN, VITAMINB12  There were no vitals filed for this visit.  Review of System:  Review of Systems  Constitutional:  Positive for malaise/fatigue.  Neurological:  Positive for sensory change (Hot flashes). Negative for dizziness and headaches.    Physical Exam: Physical Exam Neurological:     Mental Status: She is alert and oriented to person, place, and time.      I provided 15 minutes of non face-to-face telephone visit time during this encounter, and > 50% was spent counseling as documented under my assessment & plan.   Delon Hope, NP 07/22/2024 1:40 PM

## 2024-08-23 DIAGNOSIS — E782 Mixed hyperlipidemia: Secondary | ICD-10-CM | POA: Diagnosis not present

## 2024-08-23 DIAGNOSIS — I10 Essential (primary) hypertension: Secondary | ICD-10-CM | POA: Diagnosis not present

## 2024-08-23 DIAGNOSIS — E559 Vitamin D deficiency, unspecified: Secondary | ICD-10-CM | POA: Diagnosis not present

## 2024-09-01 DIAGNOSIS — F331 Major depressive disorder, recurrent, moderate: Secondary | ICD-10-CM | POA: Diagnosis not present

## 2024-09-01 DIAGNOSIS — Z853 Personal history of malignant neoplasm of breast: Secondary | ICD-10-CM | POA: Diagnosis not present

## 2024-09-01 DIAGNOSIS — N3289 Other specified disorders of bladder: Secondary | ICD-10-CM | POA: Diagnosis not present

## 2024-09-01 DIAGNOSIS — R42 Dizziness and giddiness: Secondary | ICD-10-CM | POA: Diagnosis not present

## 2024-09-01 DIAGNOSIS — E782 Mixed hyperlipidemia: Secondary | ICD-10-CM | POA: Diagnosis not present

## 2024-09-01 DIAGNOSIS — K219 Gastro-esophageal reflux disease without esophagitis: Secondary | ICD-10-CM | POA: Diagnosis not present

## 2024-09-01 DIAGNOSIS — M8580 Other specified disorders of bone density and structure, unspecified site: Secondary | ICD-10-CM | POA: Diagnosis not present

## 2024-09-01 DIAGNOSIS — M179 Osteoarthritis of knee, unspecified: Secondary | ICD-10-CM | POA: Diagnosis not present

## 2024-09-01 DIAGNOSIS — I480 Paroxysmal atrial fibrillation: Secondary | ICD-10-CM | POA: Diagnosis not present

## 2024-09-01 DIAGNOSIS — M1612 Unilateral primary osteoarthritis, left hip: Secondary | ICD-10-CM | POA: Diagnosis not present

## 2024-09-01 DIAGNOSIS — K581 Irritable bowel syndrome with constipation: Secondary | ICD-10-CM | POA: Diagnosis not present

## 2024-09-07 ENCOUNTER — Other Ambulatory Visit: Payer: Self-pay | Admitting: Cardiology

## 2024-09-22 DIAGNOSIS — Z23 Encounter for immunization: Secondary | ICD-10-CM | POA: Diagnosis not present

## 2024-09-29 DIAGNOSIS — J069 Acute upper respiratory infection, unspecified: Secondary | ICD-10-CM | POA: Diagnosis not present

## 2024-09-29 DIAGNOSIS — Z87891 Personal history of nicotine dependence: Secondary | ICD-10-CM | POA: Diagnosis not present

## 2024-10-03 ENCOUNTER — Telehealth: Payer: Self-pay | Admitting: Cardiology

## 2024-10-03 ENCOUNTER — Ambulatory Visit: Attending: Cardiology | Admitting: Cardiology

## 2024-10-03 ENCOUNTER — Encounter: Payer: Self-pay | Admitting: Cardiology

## 2024-10-03 VITALS — BP 144/89 | HR 63 | Ht 64.0 in | Wt 178.6 lb

## 2024-10-03 DIAGNOSIS — E782 Mixed hyperlipidemia: Secondary | ICD-10-CM | POA: Diagnosis not present

## 2024-10-03 DIAGNOSIS — Z789 Other specified health status: Secondary | ICD-10-CM | POA: Diagnosis not present

## 2024-10-03 DIAGNOSIS — R0602 Shortness of breath: Secondary | ICD-10-CM

## 2024-10-03 DIAGNOSIS — I4892 Unspecified atrial flutter: Secondary | ICD-10-CM

## 2024-10-03 DIAGNOSIS — I471 Supraventricular tachycardia, unspecified: Secondary | ICD-10-CM

## 2024-10-03 NOTE — Telephone Encounter (Signed)
Checking percert on the following patient for testing scheduled at [facility].

## 2024-10-03 NOTE — Progress Notes (Signed)
    Cardiology Office Note  Date: 10/03/2024   ID: Kathy Evans, DOB 01-26-1951, MRN [id number]  History of Present Illness: Kathy Evans is a 73 y.o. female last seen in May.  She is here today with her husband for a follow-up visit.  Reports no definite sense of palpitations and has not seen any unusual tachycardia or definite atrial fibrillation by [facility] monitor.  I reviewed these images.  She does report a feeling of lightheadedness and shortness of breath when she stands up in the mornings, not every day, never associated with syncope.  She states that she has to sit down and rest.  She takes her Cardizem  CD in the evenings.  Orthostatic vital signs today were negative, systolics in the 150s and 140s with no substantial increase in heart rate.  We went over her medications.  She reports compliance with current therapy.  No spontaneous bleeding problems on Eliquis .  I did go over her recent lab work from October.  We discussed getting an updated echocardiogram.  Physical Exam: VS:  BP 128/70   Pulse (!) 57   Ht 5' 4 (1.626 m)   Wt 178 lb 9.6 oz (81 kg)   SpO2 97%   BMI 30.66 kg/m , BMI Body mass index is 30.66 kg/m.  Wt Readings from Last 3 Encounters:  10/03/24 178 lb 9.6 oz (81 kg)  03/28/24 182 lb 9.6 oz (82.8 kg)  01/28/24 181 lb (82.1 kg)    General: Patient appears comfortable at rest. HEENT: Conjunctiva and lids normal. Neck: Supple, no elevated JVP or carotid bruits. Lungs: Clear to auscultation, nonlabored breathing at rest. Cardiac: Regular rate and rhythm, no S3 or significant systolic murmur. Extremities: No pitting edema.  ECG:  An ECG dated 03/09/2024 was personally reviewed today and demonstrated:  Sinus rhythm with leftward axis, nonspecific ST changes.  Labwork:  October 2025: Hemoglobin 14.1, platelets 259, BUN 13, creatinine 0.88, potassium 5.2, GFR 68, AST 21, ALT 25, cholesterol 191, triglycerides 81, HDL 63, LDL 113  Other Studies Reviewed  Today:  No interval cardiac testing for review today.  Assessment and Plan:  1.  History of paroxysmal atrial flutter and intermittent PSVT with CHA2DS2-VASc score of 2.  No increasing sense of palpitations.  No obvious arrhythmia caught by [facility] monitor at home.  For now plan to continue observation on Cardizem  CD 120 mg in the evening and Eliquis  5 mg twice daily.  2.  Episodes of lightheadedness and shortness of breath, often in the mornings, does not take Cardizem  CD until the evening.  No associated palpitations at the time.  She was not orthostatic today.  Not clear that sequential compression stockings would be of much benefit for symptoms in this case.  We will update echocardiogram to ensure no decrease in LVEF.  She does not report any associated orthopnea or PND however.   2.  Severe mixed hyperlipidemia with multi-statin intolerance as well as intolerance to Zetia.  Continue Repatha  140 mg/mL every 14 days.  HDL 63 and LDL 113 in October.  Disposition:  Follow up 6 months.  Signed, Kathy Evans, M.D., F.A.C.C. [facility] at [facility]

## 2024-10-03 NOTE — Patient Instructions (Addendum)

## 2024-10-04 ENCOUNTER — Ambulatory Visit: Payer: Self-pay | Admitting: Cardiology

## 2024-10-04 ENCOUNTER — Ambulatory Visit: Attending: Cardiology

## 2024-10-04 DIAGNOSIS — R0602 Shortness of breath: Secondary | ICD-10-CM | POA: Diagnosis not present

## 2024-10-04 LAB — ECHOCARDIOGRAM COMPLETE
AR max vel: 2.62 cm2
AV Peak grad: 7.7 mmHg
AV Vena cont: 0.7 cm
Ao pk vel: 1.39 m/s
Area-P 1/2: 3.77 cm2
Calc EF: 54.6 %
P 1/2 time: 481 ms
S' Lateral: 2.1 cm
Single Plane A2C EF: 55.1 %
Single Plane A4C EF: 53 %

## 2024-10-05 ENCOUNTER — Ambulatory Visit: Admitting: Podiatry

## 2024-10-06 NOTE — Telephone Encounter (Signed)
-----   Message from Jayson Sierras sent at 10/04/2024  4:25 PM EST ----- Results reviewed.  Follow-up echocardiogram shows normal LVEF at 60 to 65%.  LV relaxation pattern is impaired with evidence of increased filling pressures, could be associated with shortness of  breath, but not typically lightheadedness.  History of atrial fibrillation would go along with this.  Would continue to track blood pressure at home.  If she experiences any fluid retention, may need  to think about adjusting her medications further. ----- Message ----- From: Interface, [employer] One Seven Sent: 10/04/2024   3:57 PM EST To: Jayson KANDICE Sierras, MD

## 2024-10-06 NOTE — Telephone Encounter (Signed)
 Patient informed and verbalized understanding of plan. Reports she currently has lasix  20 mg to take daily as needed for swelling that was prescribed by her PCP, but has not had to used it. Medication profile updated with this addition.

## 2024-10-21 ENCOUNTER — Encounter (HOSPITAL_COMMUNITY): Payer: Self-pay | Admitting: Emergency Medicine

## 2024-10-21 ENCOUNTER — Emergency Department (HOSPITAL_COMMUNITY)
Admission: EM | Admit: 2024-10-21 | Discharge: 2024-10-22 | Disposition: A | Attending: Emergency Medicine | Admitting: Emergency Medicine

## 2024-10-21 ENCOUNTER — Other Ambulatory Visit: Payer: Self-pay

## 2024-10-21 DIAGNOSIS — R011 Cardiac murmur, unspecified: Secondary | ICD-10-CM | POA: Diagnosis not present

## 2024-10-21 DIAGNOSIS — Z853 Personal history of malignant neoplasm of breast: Secondary | ICD-10-CM | POA: Diagnosis not present

## 2024-10-21 DIAGNOSIS — Z7901 Long term (current) use of anticoagulants: Secondary | ICD-10-CM | POA: Insufficient documentation

## 2024-10-21 DIAGNOSIS — Z87891 Personal history of nicotine dependence: Secondary | ICD-10-CM | POA: Insufficient documentation

## 2024-10-21 DIAGNOSIS — R002 Palpitations: Secondary | ICD-10-CM | POA: Insufficient documentation

## 2024-10-21 NOTE — ED Triage Notes (Addendum)
 Pt states she has A fib and woke up around 2230 with a HR of 135. States it keeps going up and down. Pt also c/o nausea.

## 2024-10-22 LAB — CBC WITH DIFFERENTIAL/PLATELET
Abs Immature Granulocytes: 0.04 K/uL (ref 0.00–0.07)
Basophils Absolute: 0.1 K/uL (ref 0.0–0.1)
Basophils Relative: 1 %
Eosinophils Absolute: 0.4 K/uL (ref 0.0–0.5)
Eosinophils Relative: 6 %
HCT: 43.3 % (ref 36.0–46.0)
Hemoglobin: 14.2 g/dL (ref 12.0–15.0)
Immature Granulocytes: 1 %
Lymphocytes Relative: 26 %
Lymphs Abs: 1.6 K/uL (ref 0.7–4.0)
MCH: 32.2 pg (ref 26.0–34.0)
MCHC: 32.8 g/dL (ref 30.0–36.0)
MCV: 98.2 fL (ref 80.0–100.0)
Monocytes Absolute: 0.7 K/uL (ref 0.1–1.0)
Monocytes Relative: 11 %
Neutro Abs: 3.5 K/uL (ref 1.7–7.7)
Neutrophils Relative %: 55 %
Platelets: 247 K/uL (ref 150–400)
RBC: 4.41 MIL/uL (ref 3.87–5.11)
RDW: 12.8 % (ref 11.5–15.5)
WBC: 6.3 K/uL (ref 4.0–10.5)
nRBC: 0 % (ref 0.0–0.2)

## 2024-10-22 LAB — BASIC METABOLIC PANEL WITH GFR
Anion gap: 6 (ref 5–15)
BUN: 13 mg/dL (ref 8–23)
CO2: 31 mmol/L (ref 22–32)
Calcium: 9.3 mg/dL (ref 8.9–10.3)
Chloride: 106 mmol/L (ref 98–111)
Creatinine, Ser: 0.77 mg/dL (ref 0.44–1.00)
GFR, Estimated: >60 mL/min (ref >60)
Glucose, Bld: 85 mg/dL (ref 70–99)
Potassium: 3.9 mmol/L (ref 3.5–5.1)
Sodium: 143 mmol/L (ref 135–145)

## 2024-10-22 LAB — MAGNESIUM: Magnesium: 2.3 mg/dL (ref 1.7–2.4)

## 2024-10-22 NOTE — ED Provider Notes (Signed)
 Emergency Department Provider Note  TRIAGE NOTE: Pt states she has A fib and woke up around 2230 with a HR of 135. States it keeps going up and down. Pt also c/o nausea.  HISTORY  Chief Complaint Palpitations   HPI Kathy Evans is a 73 y.o. female with   episodes of rapid heart rate, reporting an increase from 90 to 135 beats per minute, accompanied by nausea and a sensation of choking. The patient has a known history of paroxysmal atrial fibrillation and is currently on diltiazem , which was taken at 8 PM. This is the second episode within a week, with a previous occurrence reaching a heart rate of 145 beats per minute. The patient typically experiences shortness of breath a couple of times a week, which has been attributed to diastolic dysfunction and left ventricular hypertrophy as per recent echocardiogram findings. The patient denies recent changes in medications, except for a course of Augmentin  taken three weeks ago for a cold. There is no history of tobacco or alcohol use, and the patient reports occasional caffeine intake, with the last consumption on Friday morning. The patient denies any recent chest pain, significant shortness of breath, or lightheadedness during the current episode. History was obtained from the patient and their spouse.  PMH Past Medical History:  Diagnosis Date   Arthritis    Atrial flutter [facility])    Diagnosed October 2021   Breast cancer (HCC) 03/2017   Right   Bronchitis    Depression    GERD (gastroesophageal reflux disease)    Heart murmur    Hyperlipidemia    IBS (irritable bowel syndrome)    Impingement syndrome of left shoulder    Personal history of radiation therapy 2018   Vertigo     Home Medications Prior to Admission medications   Medication Sig Start Date End Date Taking? Authorizing Provider  ALPRAZolam  (XANAX ) 0.5 MG tablet Take 0.25-0.5 mg by mouth daily as needed for anxiety or sleep.    [provider]   amoxicillin -clavulanate (AUGMENTIN ) 875-125 MG tablet Take 1 tablet by mouth 2 (two) times daily. 09/29/24   [provider]  buPROPion  (WELLBUTRIN  XL) 300 MG 24 hr tablet Take 300 mg by mouth daily.    [provider]  Cholecalciferol (D3 EXTRA STRENGTH PO) Take by mouth.    [provider]  diltiazem  (CARDIZEM  CD) 120 MG 24 hr capsule Take 1 capsule (120 mg total) by mouth daily. 11/23/23   Debera Jayson MATSU, MD  ELIQUIS  5 MG TABS tablet TAKE ONE TABLET BY MOUTH TWICE DAILY. 05/09/24   Debera Jayson MATSU, MD  Evolocumab  (REPATHA  SURECLICK) 140 MG/ML SOAJ Inject 140 mg into the skin every 14 (fourteen) days. 09/08/24   Debera Jayson MATSU, MD  furosemide  (LASIX ) 20 MG tablet Take 20 mg by mouth daily as needed (swelling).    [provider]  hydrOXYzine (VISTARIL) 25 MG capsule Take 25-50 mg by mouth at bedtime. 04/06/24   [provider]  levocetirizine (XYZAL) 5 MG tablet Take 5 mg by mouth every evening. 03/04/18   [provider]  MAGNESIUM PO Take 1 tablet by mouth at bedtime.    [provider]  Pyridoxine HCl (B-6 PO) Take by mouth.    [provider]    Social History Social History   Tobacco Use   Smoking status: Former    Current packs/day: 0.00    Average packs/day: 1.5 packs/day for 35.0 years (52.5 ttl pk-yrs)    Types: Cigarettes  Start date: 36    Quit date: 2000    Years since quitting: 25.9   Smokeless tobacco: Never  Vaping Use   Vaping status: Never Used  Substance Use Topics   Alcohol use: Not Currently    Comment: very seldom   Drug use: No    Review of Systems: Documented in HPI ____________________________________________  PHYSICAL EXAM: VITAL SIGNS: Triage: Blood pressure 134/64, pulse 64, temperature 97.7 F (36.5 C), temperature source Oral, resp. rate 16, height 5' 4 (1.626 m), weight 81 kg, SpO2 95%.  Vitals:   10/21/24 2357 10/21/24 2358 10/22/24 0015  BP:  (!) 150/78  134/64  Pulse:  66 64  Resp:  16 16  Temp:  97.7 F (36.5 C)   TempSrc:  Oral   SpO2:  97% 95%  Weight: 81 kg    Height: 5' 4 (1.626 m)      Physical Exam Vitals and nursing note reviewed.  Constitutional:      Appearance: She is well-developed.  HENT:     Head: Normocephalic and atraumatic.  Eyes:     Pupils: Pupils are equal, round, and reactive to light.  Cardiovascular:     Rate and Rhythm: Normal rate and regular rhythm.     Heart sounds: Murmur heard.  Pulmonary:     Effort: No respiratory distress.     Breath sounds: No stridor. No wheezing or rales.  Abdominal:     General: There is no distension.     Tenderness: There is no abdominal tenderness.  Musculoskeletal:     Cervical back: Normal range of motion.     Right lower leg: No edema.     Left lower leg: No edema.  Skin:    General: Skin is warm and dry.  Neurological:     General: No focal deficit present.     Mental Status: She is alert.       ____________________________________________   LABS (all labs ordered are listed, but only abnormal results are displayed)  Labs Reviewed  CBC WITH DIFFERENTIAL/PLATELET  BASIC METABOLIC PANEL WITH GFR  MAGNESIUM   ____________________________________________  EKG   EKG Interpretation Date/Time:  Friday October 21 2024 23:59:10 EST Ventricular Rate:  64 PR Interval:  153 QRS Duration:  114 QT Interval:  415 QTC Calculation: 429 R Axis:   -41  Text Interpretation: Sinus rhythm Borderline IVCD with LAD Abnormal R-wave progression, late transition Borderline T abnormalities, anterior leads no change from january Confirmed by Lorette Mayo [phone]) on 10/22/2024 12:06:23 AM        ____________________________________________  RADIOLOGY  No results found. ____________________________________________  PROCEDURES  Procedure(s) performed:   Procedures ____________________________________________  INITIAL IMPRESSION / ASSESSMENT AND PLAN    Clinical Course as of 10/22/24 0634  Sat Oct 22, 2024  0058 Initial Evaluation:  Patient presents with episodes of paroxysmal atrial fibrillation, experiencing rapid heart rates between 90 and 135 beats per minute, alongside symptoms of nausea and a sensation of choking. Plan:  Perform basic laboratory tests to assess electrolytes. Monitor cardiac rhythm on a cardiac monitor. Assess effectiveness of vagal maneuvers or other interventions. Possibly administer a small dose of intravenous diltiazem  if necessary. Communicate with the patient's cardiologist for potential medication adjustments. [JM]  0210 Pulse Rate(!): 57 [JM]  0210 Pulse Rate(!): 55 [JM]  0210 Pulse Rate(!): 54 [JM]  0210 BP(!): 128/57 [JM]  0210 BP(!): 128/56 [JM]  0210 Potassium: 3.9 [JM]  0210 Calcium: 9.3 [JM]  0211 Sodium: 143 [JM]  0211  Creatinine: 0.77 [JM]  0211 Magnesium: 2.3 [JM]  0211 Hemoglobin: 14.2 Reviewed the monitor and there were no alerts recorded and patient doesn't endorse any episodes while here. As her symptoms improved, labs are normal I don't see any indication for further workup, hospitalization or treatment in the ED at this time. Suspect it was just a nonsustained Afib RVR episode. Long discussion regarding vagal maneuvers, symptomatic Afib and HR concerns that require repeat emergent evaluation vs 'wait and see' or cardiology follow up. Will route encounter to her cardiologist so he is aware.  [JM]    Clinical Course User Index [JM] Melora Menon, Selinda, MD     Images ordered viewed and obtained by myself. Agree with Radiology interpretation. Details in ED course.  Labs ordered reviewed by myself as detailed in ED course.  Consultations obtained/considered detailed in ED course.   FINAL IMPRESSION Final diagnoses:  None     Disposition A medical screening exam was performed and I feel the patient has had an appropriate workup for their chief complaint at this time and likelihood of  emergent condition existing is low. They have been counseled on decision, DISCHARGE, follow up and which symptoms necessitate immediate return to the emergency department. They or their family verbally stated understanding and agreement with plan and discharged in stable condition.   ____________________________________________   NEW OUTPATIENT MEDICATIONS STARTED DURING THIS VISIT:  New Prescriptions   No medications on file    Note:  This note was prepared with assistance of Dragon voice recognition software. Occasional wrong-word or sound-a-like substitutions may have occurred due to the inherent limitations of voice recognition software.    Lorette Selinda, MD 10/22/24 [phone]

## 2024-10-26 ENCOUNTER — Ambulatory Visit: Admitting: Podiatry

## 2024-10-31 ENCOUNTER — Ambulatory Visit: Admitting: Podiatry

## 2024-10-31 DIAGNOSIS — B351 Tinea unguium: Secondary | ICD-10-CM

## 2024-10-31 DIAGNOSIS — M79676 Pain in unspecified toe(s): Secondary | ICD-10-CM | POA: Diagnosis not present

## 2024-10-31 NOTE — Progress Notes (Signed)
 She presents today chief complaint of painful elongated toenails.  States that her great toe right foot is doing well with no problems.  So the other nails are long and need some help with trimming.  Objective: Vital signs are stable she is alert and oriented x 3.  Pulses are palpable.  There is no erythema edema cellulitis drainage or odor.  Toenails are long thick yellow dystrophic clinically mycotic.  Assessment: Pain limb secondary to onychomycosis.  Plan: Debridement of toenails 1 through 5 bilateral.

## 2024-11-08 ENCOUNTER — Other Ambulatory Visit: Payer: Self-pay | Admitting: Cardiology

## 2024-11-08 NOTE — Telephone Encounter (Signed)
 Pt last saw Dr Debera 10/03/24, last labs 10/22/24 Creat 0.77, age 73, weight 81kg, based on specified criteria pt is on appropriate dosage of Eliquis  5mg  BID for aflutter.  Will refill rx.

## 2024-11-14 ENCOUNTER — Encounter: Payer: Self-pay | Admitting: Cardiology

## 2024-11-15 ENCOUNTER — Telehealth: Payer: Self-pay | Admitting: Pharmacy Technician

## 2024-11-15 NOTE — Telephone Encounter (Signed)
 Patient Advocate Encounter   The patient was approved for a [employer] grant that will help cover the cost of REPATHA  Total amount awarded, 2500.  Effective: 11/10/24 - 11/09/25   APW:[id number] ERW:[id number] Hmnle:[id number] PI:[id number] [employer] ID: [id number]   Pharmacy provided with approval and processing information. Patient informed via mychart    Information provided to [facility] via telephone   Patient just received repatha  on 11/04/24

## 2025-01-23 ENCOUNTER — Ambulatory Visit (HOSPITAL_COMMUNITY)

## 2025-07-21 ENCOUNTER — Other Ambulatory Visit

## 2025-07-28 ENCOUNTER — Ambulatory Visit: Admitting: Oncology
